# Patient Record
Sex: Male | Born: 1940 | Race: White | Hispanic: No | Smoking: Never smoker
Health system: Southern US, Community
[De-identification: ages and names within clinical notes are randomized; demographics above are authoritative.]

## PROBLEM LIST (undated history)

## (undated) DIAGNOSIS — E785 Hyperlipidemia, unspecified: Secondary | ICD-10-CM

## (undated) DIAGNOSIS — R609 Edema, unspecified: Secondary | ICD-10-CM

## (undated) DIAGNOSIS — R42 Dizziness and giddiness: Secondary | ICD-10-CM

## (undated) DIAGNOSIS — R0602 Shortness of breath: Secondary | ICD-10-CM

## (undated) DIAGNOSIS — I639 Cerebral infarction, unspecified: Secondary | ICD-10-CM

## (undated) DIAGNOSIS — R911 Solitary pulmonary nodule: Secondary | ICD-10-CM

## (undated) DIAGNOSIS — K219 Gastro-esophageal reflux disease without esophagitis: Secondary | ICD-10-CM

## (undated) DIAGNOSIS — I209 Angina pectoris, unspecified: Secondary | ICD-10-CM

## (undated) DIAGNOSIS — M1990 Unspecified osteoarthritis, unspecified site: Secondary | ICD-10-CM

## (undated) DIAGNOSIS — I10 Essential (primary) hypertension: Secondary | ICD-10-CM

## (undated) DIAGNOSIS — K5900 Constipation, unspecified: Secondary | ICD-10-CM

## (undated) DIAGNOSIS — Z87442 Personal history of urinary calculi: Secondary | ICD-10-CM

## (undated) DIAGNOSIS — C6290 Malignant neoplasm of unspecified testis, unspecified whether descended or undescended: Secondary | ICD-10-CM

## (undated) DIAGNOSIS — I2510 Atherosclerotic heart disease of native coronary artery without angina pectoris: Secondary | ICD-10-CM

## (undated) HISTORY — DX: Solitary pulmonary nodule: R91.1

## (undated) HISTORY — DX: Hyperlipidemia, unspecified: E78.5

## (undated) HISTORY — PX: TONSILLECTOMY: SUR1361

## (undated) HISTORY — DX: Dizziness and giddiness: R42

## (undated) HISTORY — DX: Malignant neoplasm of unspecified testis, unspecified whether descended or undescended: C62.90

## (undated) HISTORY — DX: Edema, unspecified: R60.9

## (undated) HISTORY — PX: OTHER SURGICAL HISTORY: SHX169

## (undated) HISTORY — PX: APPENDECTOMY: SHX54

## (undated) HISTORY — PX: ROTATOR CUFF REPAIR: SHX139

## (undated) HISTORY — DX: Atherosclerotic heart disease of native coronary artery without angina pectoris: I25.10

## (undated) HISTORY — DX: Unspecified osteoarthritis, unspecified site: M19.90

## (undated) HISTORY — DX: Essential (primary) hypertension: I10

## (undated) HISTORY — PX: TOTAL HIP ARTHROPLASTY: SHX124

---

## 2008-05-11 ENCOUNTER — Ambulatory Visit: Payer: Self-pay | Admitting: Cardiology

## 2008-05-11 ENCOUNTER — Ambulatory Visit: Payer: Self-pay | Admitting: Cardiovascular Disease

## 2008-05-11 ENCOUNTER — Inpatient Hospital Stay (HOSPITAL_COMMUNITY): Admission: EM | Admit: 2008-05-11 | Discharge: 2008-05-15 | Payer: Self-pay | Admitting: Emergency Medicine

## 2008-05-14 ENCOUNTER — Encounter (INDEPENDENT_AMBULATORY_CARE_PROVIDER_SITE_OTHER): Payer: Self-pay | Admitting: Emergency Medicine

## 2008-05-14 ENCOUNTER — Ambulatory Visit: Payer: Self-pay | Admitting: Surgery

## 2008-05-14 ENCOUNTER — Encounter: Payer: Self-pay | Admitting: Cardiovascular Disease

## 2008-05-27 ENCOUNTER — Ambulatory Visit: Payer: Self-pay | Admitting: Internal Medicine

## 2008-07-10 ENCOUNTER — Ambulatory Visit (HOSPITAL_COMMUNITY): Admission: RE | Admit: 2008-07-10 | Discharge: 2008-07-10 | Payer: Self-pay | Admitting: Internal Medicine

## 2008-07-15 ENCOUNTER — Ambulatory Visit: Payer: Self-pay | Admitting: Internal Medicine

## 2008-07-15 LAB — CONVERTED CEMR LAB: AST: 24 units/L (ref 0–37)

## 2008-07-22 ENCOUNTER — Ambulatory Visit: Payer: Self-pay | Admitting: Internal Medicine

## 2008-07-22 DIAGNOSIS — I10 Essential (primary) hypertension: Secondary | ICD-10-CM

## 2008-07-22 DIAGNOSIS — E782 Mixed hyperlipidemia: Secondary | ICD-10-CM

## 2008-07-22 DIAGNOSIS — E785 Hyperlipidemia, unspecified: Secondary | ICD-10-CM

## 2008-07-22 DIAGNOSIS — I2510 Atherosclerotic heart disease of native coronary artery without angina pectoris: Secondary | ICD-10-CM

## 2008-07-22 HISTORY — DX: Atherosclerotic heart disease of native coronary artery without angina pectoris: I25.10

## 2008-07-22 HISTORY — DX: Hyperlipidemia, unspecified: E78.5

## 2008-07-22 HISTORY — DX: Essential (primary) hypertension: I10

## 2008-07-29 ENCOUNTER — Telehealth: Payer: Self-pay | Admitting: Gastroenterology

## 2008-09-09 ENCOUNTER — Ambulatory Visit: Payer: Self-pay | Admitting: Internal Medicine

## 2008-09-09 LAB — CONVERTED CEMR LAB
BUN: 21 mg/dL (ref 6–23)
Potassium: 4.4 meq/L (ref 3.5–5.1)

## 2008-10-17 ENCOUNTER — Telehealth (INDEPENDENT_AMBULATORY_CARE_PROVIDER_SITE_OTHER): Payer: Self-pay | Admitting: *Deleted

## 2008-11-08 ENCOUNTER — Ambulatory Visit: Payer: Self-pay | Admitting: Internal Medicine

## 2008-11-08 DIAGNOSIS — R0602 Shortness of breath: Secondary | ICD-10-CM

## 2008-11-08 DIAGNOSIS — M542 Cervicalgia: Secondary | ICD-10-CM

## 2008-11-10 LAB — CONVERTED CEMR LAB
BUN: 27 mg/dL — ABNORMAL HIGH (ref 6–23)
Basophils Absolute: 0 10*3/uL (ref 0.0–0.1)
Eosinophils Relative: 3 % (ref 0.0–5.0)
GFR calc non Af Amer: 78.8 mL/min (ref >60)
Hemoglobin: 12.9 g/dL — ABNORMAL LOW (ref 13.0–17.0)
Monocytes Absolute: 0.7 10*3/uL (ref 0.1–1.0)
Platelets: 218 10*3/uL (ref 150.0–400.0)
Potassium: 4.3 meq/L (ref 3.5–5.1)
RBC: 4.06 M/uL — ABNORMAL LOW (ref 4.22–5.81)
Sodium: 142 meq/L (ref 135–145)
Total CK: 109 units/L (ref 7–232)

## 2008-11-11 ENCOUNTER — Telehealth (INDEPENDENT_AMBULATORY_CARE_PROVIDER_SITE_OTHER): Payer: Self-pay | Admitting: *Deleted

## 2008-11-12 ENCOUNTER — Encounter: Admission: RE | Admit: 2008-11-12 | Discharge: 2008-12-11 | Payer: Self-pay | Admitting: Internal Medicine

## 2008-11-14 ENCOUNTER — Encounter: Payer: Self-pay | Admitting: Internal Medicine

## 2008-11-20 ENCOUNTER — Telehealth (INDEPENDENT_AMBULATORY_CARE_PROVIDER_SITE_OTHER): Payer: Self-pay

## 2008-11-21 ENCOUNTER — Ambulatory Visit: Payer: Self-pay

## 2008-11-21 ENCOUNTER — Encounter: Payer: Self-pay | Admitting: Internal Medicine

## 2008-11-25 ENCOUNTER — Ambulatory Visit: Payer: Self-pay | Admitting: Internal Medicine

## 2008-11-25 ENCOUNTER — Encounter: Payer: Self-pay | Admitting: Internal Medicine

## 2008-11-25 ENCOUNTER — Telehealth: Payer: Self-pay | Admitting: Internal Medicine

## 2008-11-25 DIAGNOSIS — R079 Chest pain, unspecified: Secondary | ICD-10-CM

## 2008-11-26 ENCOUNTER — Ambulatory Visit (HOSPITAL_COMMUNITY): Admission: RE | Admit: 2008-11-26 | Discharge: 2008-11-26 | Payer: Self-pay | Admitting: Cardiovascular Disease

## 2008-11-26 ENCOUNTER — Ambulatory Visit: Payer: Self-pay | Admitting: Cardiology

## 2008-11-26 LAB — CONVERTED CEMR LAB
BUN: 21 mg/dL (ref 6–23)
Basophils Relative: 0.9 % (ref 0.0–3.0)
Calcium: 9.3 mg/dL (ref 8.4–10.5)
Chloride: 108 meq/L (ref 96–112)
Creatinine, Ser: 0.8 mg/dL (ref 0.4–1.5)
Eosinophils Absolute: 0.1 10*3/uL (ref 0.0–0.7)
Eosinophils Relative: 2.3 % (ref 0.0–5.0)
Hemoglobin: 12.5 g/dL — ABNORMAL LOW (ref 13.0–17.0)
Lymphocytes Relative: 22.2 % (ref 12.0–46.0)
Lymphs Abs: 1 10*3/uL (ref 0.7–4.0)
MCHC: 34.5 g/dL (ref 30.0–36.0)
MCV: 93.8 fL (ref 78.0–100.0)
Neutrophils Relative %: 62.3 % (ref 43.0–77.0)
Platelets: 217 10*3/uL (ref 150.0–400.0)
RDW: 13.4 % (ref 11.5–14.6)
WBC: 4.7 10*3/uL (ref 4.5–10.5)

## 2008-11-28 ENCOUNTER — Encounter: Payer: Self-pay | Admitting: Cardiovascular Disease

## 2008-12-02 ENCOUNTER — Telehealth (INDEPENDENT_AMBULATORY_CARE_PROVIDER_SITE_OTHER): Payer: Self-pay | Admitting: *Deleted

## 2008-12-04 ENCOUNTER — Inpatient Hospital Stay (HOSPITAL_COMMUNITY): Admission: AD | Admit: 2008-12-04 | Discharge: 2008-12-05 | Payer: Self-pay | Admitting: Cardiovascular Disease

## 2008-12-04 ENCOUNTER — Ambulatory Visit: Payer: Self-pay | Admitting: Cardiovascular Disease

## 2008-12-04 ENCOUNTER — Encounter: Payer: Self-pay | Admitting: Internal Medicine

## 2008-12-05 ENCOUNTER — Ambulatory Visit: Payer: Self-pay | Admitting: Vascular Surgery

## 2008-12-05 ENCOUNTER — Encounter: Payer: Self-pay | Admitting: Cardiovascular Disease

## 2008-12-05 ENCOUNTER — Telehealth (INDEPENDENT_AMBULATORY_CARE_PROVIDER_SITE_OTHER): Payer: Self-pay | Admitting: *Deleted

## 2008-12-05 DIAGNOSIS — R609 Edema, unspecified: Secondary | ICD-10-CM

## 2008-12-05 HISTORY — DX: Edema, unspecified: R60.9

## 2008-12-09 ENCOUNTER — Telehealth: Payer: Self-pay | Admitting: Internal Medicine

## 2008-12-09 ENCOUNTER — Encounter: Admission: RE | Admit: 2008-12-09 | Discharge: 2008-12-09 | Payer: Self-pay | Admitting: Internal Medicine

## 2008-12-25 ENCOUNTER — Encounter: Payer: Self-pay | Admitting: Physician Assistant

## 2008-12-25 ENCOUNTER — Ambulatory Visit: Payer: Self-pay | Admitting: Internal Medicine

## 2009-01-02 ENCOUNTER — Encounter (HOSPITAL_COMMUNITY): Admission: RE | Admit: 2009-01-02 | Discharge: 2009-04-02 | Payer: Self-pay | Admitting: Cardiovascular Disease

## 2009-01-08 ENCOUNTER — Encounter: Payer: Self-pay | Admitting: Internal Medicine

## 2009-01-24 ENCOUNTER — Encounter: Payer: Self-pay | Admitting: Internal Medicine

## 2009-02-04 ENCOUNTER — Encounter: Payer: Self-pay | Admitting: Cardiovascular Disease

## 2009-02-19 ENCOUNTER — Encounter (INDEPENDENT_AMBULATORY_CARE_PROVIDER_SITE_OTHER): Payer: Self-pay | Admitting: *Deleted

## 2009-02-26 ENCOUNTER — Encounter: Payer: Self-pay | Admitting: Internal Medicine

## 2009-02-27 ENCOUNTER — Ambulatory Visit: Payer: Self-pay | Admitting: Internal Medicine

## 2009-02-27 ENCOUNTER — Encounter: Payer: Self-pay | Admitting: Nurse Practitioner

## 2009-02-27 DIAGNOSIS — R109 Unspecified abdominal pain: Secondary | ICD-10-CM | POA: Insufficient documentation

## 2009-02-27 DIAGNOSIS — R42 Dizziness and giddiness: Secondary | ICD-10-CM

## 2009-02-27 DIAGNOSIS — R1084 Generalized abdominal pain: Secondary | ICD-10-CM

## 2009-02-27 HISTORY — DX: Dizziness and giddiness: R42

## 2009-02-27 LAB — CONVERTED CEMR LAB
Albumin: 3.4 g/dL — ABNORMAL LOW (ref 3.5–5.2)
Alkaline Phosphatase: 62 units/L (ref 39–117)
Bilirubin, Direct: 0 mg/dL (ref 0.0–0.3)
Total Bilirubin: 0.7 mg/dL (ref 0.3–1.2)

## 2009-02-28 ENCOUNTER — Encounter
Admission: RE | Admit: 2009-02-28 | Discharge: 2009-05-29 | Payer: Self-pay | Admitting: Physical Medicine & Rehabilitation

## 2009-03-03 ENCOUNTER — Ambulatory Visit (HOSPITAL_COMMUNITY): Admission: RE | Admit: 2009-03-03 | Discharge: 2009-03-03 | Payer: Self-pay | Admitting: Internal Medicine

## 2009-03-12 ENCOUNTER — Encounter: Payer: Self-pay | Admitting: Internal Medicine

## 2009-03-21 ENCOUNTER — Encounter: Payer: Self-pay | Admitting: Internal Medicine

## 2009-03-24 ENCOUNTER — Ambulatory Visit: Payer: Self-pay | Admitting: Internal Medicine

## 2009-03-27 ENCOUNTER — Ambulatory Visit: Payer: Self-pay | Admitting: Gastroenterology

## 2009-03-27 LAB — CONVERTED CEMR LAB
Amylase: 106 units/L (ref 27–131)
Lipase: 37 units/L (ref 11.0–59.0)

## 2009-03-28 ENCOUNTER — Telehealth (INDEPENDENT_AMBULATORY_CARE_PROVIDER_SITE_OTHER): Payer: Self-pay | Admitting: *Deleted

## 2009-03-28 LAB — CONVERTED CEMR LAB
AST: 25 units/L (ref 0–37)
Albumin: 4.1 g/dL (ref 3.5–5.2)
Basophils Absolute: 0 10*3/uL (ref 0.0–0.1)
Basophils Relative: 0 % (ref 0.0–3.0)
Calcium: 9.7 mg/dL (ref 8.4–10.5)
Lymphs Abs: 0.9 10*3/uL (ref 0.7–4.0)
MCV: 94 fL (ref 78.0–100.0)
Monocytes Relative: 10.2 % (ref 3.0–12.0)
Neutrophils Relative %: 67.5 % (ref 43.0–77.0)
Platelets: 192 10*3/uL (ref 150.0–400.0)
Potassium: 5 meq/L (ref 3.5–5.1)
RDW: 14.1 % (ref 11.5–14.6)
Sodium: 142 meq/L (ref 135–145)
TSH: 1.02 microintl units/mL (ref 0.35–5.50)
Total Protein: 6.9 g/dL (ref 6.0–8.3)

## 2009-04-03 ENCOUNTER — Encounter (HOSPITAL_COMMUNITY): Admission: RE | Admit: 2009-04-03 | Discharge: 2009-06-18 | Payer: Self-pay | Admitting: Cardiovascular Disease

## 2009-04-04 ENCOUNTER — Ambulatory Visit (HOSPITAL_COMMUNITY): Admission: RE | Admit: 2009-04-04 | Discharge: 2009-04-04 | Payer: Self-pay | Admitting: Internal Medicine

## 2009-04-04 ENCOUNTER — Ambulatory Visit: Payer: Self-pay | Admitting: Internal Medicine

## 2009-04-04 LAB — CONVERTED CEMR LAB
Cortisol, Plasma: 4.7 ug/dL
Sed Rate: 29 mm/hr — ABNORMAL HIGH (ref 0–22)

## 2009-04-08 ENCOUNTER — Encounter: Payer: Self-pay | Admitting: Gastroenterology

## 2009-04-08 ENCOUNTER — Ambulatory Visit: Payer: Self-pay | Admitting: Gastroenterology

## 2009-04-08 ENCOUNTER — Encounter: Payer: Self-pay | Admitting: Internal Medicine

## 2009-04-11 ENCOUNTER — Encounter: Payer: Self-pay | Admitting: Gastroenterology

## 2009-04-15 ENCOUNTER — Ambulatory Visit: Payer: Self-pay | Admitting: Physical Medicine & Rehabilitation

## 2009-05-05 ENCOUNTER — Encounter: Payer: Self-pay | Admitting: Cardiovascular Disease

## 2009-05-05 ENCOUNTER — Encounter (INDEPENDENT_AMBULATORY_CARE_PROVIDER_SITE_OTHER): Payer: Self-pay | Admitting: *Deleted

## 2009-05-13 ENCOUNTER — Encounter: Payer: Self-pay | Admitting: Internal Medicine

## 2009-07-28 ENCOUNTER — Ambulatory Visit: Payer: Self-pay | Admitting: Internal Medicine

## 2009-07-29 ENCOUNTER — Encounter: Payer: Self-pay | Admitting: Internal Medicine

## 2009-08-18 ENCOUNTER — Encounter: Payer: Self-pay | Admitting: Internal Medicine

## 2009-09-06 ENCOUNTER — Ambulatory Visit: Payer: Self-pay

## 2009-09-11 ENCOUNTER — Ambulatory Visit: Payer: Self-pay

## 2009-09-11 ENCOUNTER — Encounter: Payer: Self-pay | Admitting: Internal Medicine

## 2009-09-11 ENCOUNTER — Ambulatory Visit: Payer: Self-pay | Admitting: Internal Medicine

## 2009-09-11 ENCOUNTER — Ambulatory Visit (HOSPITAL_COMMUNITY): Admission: RE | Admit: 2009-09-11 | Discharge: 2009-09-11 | Payer: Self-pay | Admitting: Internal Medicine

## 2009-12-05 ENCOUNTER — Encounter (INDEPENDENT_AMBULATORY_CARE_PROVIDER_SITE_OTHER): Payer: Self-pay | Admitting: *Deleted

## 2010-07-12 ENCOUNTER — Encounter: Payer: Self-pay | Admitting: Internal Medicine

## 2010-07-13 ENCOUNTER — Encounter: Payer: Self-pay | Admitting: Internal Medicine

## 2010-07-21 NOTE — Letter (Signed)
Summary: [facility] Office Note  [facility] Office Note   Imported By: Roderic Ovens 10/22/2009 11:08:21  _____________________________________________________________________  External Attachment:    Type:   Image     Comment:   External Document

## 2010-07-21 NOTE — Letter (Signed)
Summary: Appointment - Reminder 2  [employer], Main Office  [address]   [place], [place] [address]   Phone: [phone]  Fax: [phone]     December 05, 2009 MRN: [id number]   [name] [address] [place], [place]  [address]   Dear [name],  Our records indicate that it is time to schedule a follow-up appointment with Dr. Tenny Craw in August. It is very important that we reach you to schedule this appointment. We look forward to participating in your health care needs. Please contact us at the number listed above at your earliest convenience to schedule your appointment.  If you are unable to make an appointment at this time, give [place] a call so we can update our records.     Sincerely,    Migdalia Dk [facility] Scheduling Team

## 2010-07-21 NOTE — Miscellaneous (Signed)
  Clinical Lists Changes  Orders: Added new Referral order of Echocardiogram (Echo) - Signed Added new Referral order of Event (Event) - Signed

## 2010-07-21 NOTE — Assessment & Plan Note (Signed)
Summary: 3 month rov/sl   Referring Provider:  Dietrich Pates, MD [facility]  [facility]   History of Present Illness: [name] is a 70 year old with a history of CAD (s/p PTCADES to prox LAD.  Residual 90% ostial diag (small); 30 to 40% LCx, 30%RCA.  LVEF 40% at tim. of cath.  I saw the patient about 3 months ago.   He denies chest pain, dizziness, shortness of breath. Deniied palpitations.   Biggest complaint is back pain.  Had MRI at [facility] in [place] (Dr. Jules Husbands).   Current Medications (verified): 1)  Tramadol Hcl 50 Mg Tabs (Tramadol Hcl) .... Take One Tablet Three  Times A Day 2)  Buffered Aspirin 325 Mg Tabs (Aspirin Buf(Cacarb-Mgcarb-Mgo)) .... Take One Tablet Once Daily 3)  Fish Oil  Oil (Fish Oil) .... 2 Times Per Day 4)  Sm Omeprazole 20 Mg Tbec (Omeprazole) .Marland Kitchen.. 1 Tablet 2 Times Per Day 5)  Nitroglycerin 0.4 Mg Subl (Nitroglycerin) .... One Tablet Under Tongue Every 5 Minutes As Needed For Chest Pain---May Repeat Times Three 6)  Daily-Vitamin  Tabs (Multiple Vitamin) .... Take One Daily 7)  Selenium 50 Mcg Tabs (Selenium) .... Take One Twice Daily 8)  Prostate  Tabs (Specialty Vitamins Products) .... Take One in The Evening 9)  Vitamin C 500 Mg  Tabs (Ascorbic Acid) .... Take 1 Tablet By Mouth Two Times A Day 10)  B Complex  Tabs (B Complex Vitamins) .... Take 1 Tablet By Mouth Two Times A Day 11)  Q-10 Co-Enzyme 30 Mg Caps (Coenzyme Q10) .... Once Daily 12)  Flax Seed Oil 1000 Mg Caps (Flaxseed (Linseed)) .... Two Times A Day  Allergies (verified): No Known Drug Allergies  Past History:  Past Medical History: Last updated: 03/27/2009 Coronary artery disease      a.  s/p des to LAD in 2009      b.  s/p repeat des to LAD 11/2008 (prox to previous stent). 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  Review of Systems       All systems reviewed.  negative to the above problem.  Vital Signs:  Patient profile:   70 year old male Height:      72 inches Weight:      220 pounds BMI:     29.95 Pulse rate:   48 / minute Pulse rhythm:   regular BP sitting:   160 / 78  (left arm) Cuff size:   regular  Vitals Entered By: Burnett Kanaris, CNA (July 28, 2009 4:43 PM)  Physical Exam  General:  Well developed, well nourished, in no acute distress. Head:  normocephalic and atraumatic Neck:  JVP is normal Lungs:  CTA.  No rales or wheezes. Heart:  RRR S1, S2.  No S3.  No significant murmurs. Extremities:  NO edema   EKG  Procedure date:  07/28/2009  Findings:      Sinus rhythm is wht ventricular bigeminy.  Impression & Recommendations:  Problem # 1:  CAD, NATIVE VESSEL (ICD-414.01) Patient is doing ok without chest pain or shortness of breath.  EKG is with ventircular bigeminy.  He denies palpitations. He should be on plavix with DES less than 1 year ago. [facility]. Will discuss with him readding b blocker.  Patient had to leave for church.  No symptoms of CHF on exam. The following medications were removed from the medication list:    Plavix 75 Mg Tabs (Clopidogrel bisulfate) .Marland Kitchen... Take one tablet by mouth daily    Metoprolol Tartrate 25 Mg Tabs (Metoprolol tartrate) .Marland Kitchen... Take one half  tablet  once daily His updated medication list for this problem includes:    Buffered Aspirin 325 Mg Tabs (Aspirin buf(cacarb-mgcarb-mgo)) .Marland Kitchen... Take one tablet once daily    Nitroglycerin 0.4 Mg Subl (Nitroglycerin) ..... One tablet under tongue every 5 minutes as needed for chest pain---may repeat times three    Plavix 75 Mg Tabs (Clopidogrel bisulfate) .Marland Kitchen... 1 tablet every day    Metoprolol Succinate 25 Mg Xr24h-tab (Metoprolol succinate) ..... One half a tablet every day  Problem # 2:  HYPERTENSION, BENIGN (ICD-401.1) BP is up today.  He just got in after long trip from [place].  Will discuss readding b blocker. The following medications were removed from the medication list:    Metoprolol Tartrate 25 Mg Tabs (Metoprolol tartrate) .Marland Kitchen... Take one half  tablet once daily His updated medication list for this problem includes:    Buffered Aspirin 325 Mg Tabs (Aspirin buf(cacarb-mgcarb-mgo)) .Marland Kitchen... Take one tablet once daily    Metoprolol Succinate 25 Mg Xr24h-tab (Metoprolol succinate) ..... One half a tablet every day  Problem # 3:  HYPERLIPIDEMIA-MIXED (ICD-272.4) Continue statin. The following medications were removed from the medication list:    Simvastatin 40 Mg Tabs (Simvastatin) .Marland Kitchen... Take one tablet by mouth daily at bedtime Prescriptions: METOPROLOL SUCCINATE 25 MG XR24H-TAB (METOPROLOL SUCCINATE) one half a tablet every day  #30 x 6   Entered by:   Layne Benton, RN, BSN   Authorized by:   Sherrill Raring, MD, [facility]   Signed by:   Layne Benton, RN, BSN on 07/29/2009   Method used:   Electronically to        [facility] [address] (retail)       [phone]       [place], [place]  [address]       Ph: [phone]       Fax: [phone]   RxID:   [id number]

## 2010-07-24 NOTE — Letter (Signed)
Summary: [employer] Plavix Request Form  [facility] Plavix Request Form   Imported By: Roderic Ovens 08/01/2009 15:50:21  _____________________________________________________________________  External Attachment:    Type:   Image     Comment:   External Document

## 2010-09-28 LAB — CBC
HCT: 36.1 % — ABNORMAL LOW (ref 39.0–52.0)
Hemoglobin: 12.3 g/dL — ABNORMAL LOW (ref 13.0–17.0)
MCHC: 34 g/dL (ref 30.0–36.0)
MCV: 94.3 fL (ref 78.0–100.0)
WBC: 4.2 10*3/uL (ref 4.0–10.5)
WBC: 5.7 10*3/uL (ref 4.0–10.5)

## 2010-09-28 LAB — BASIC METABOLIC PANEL
CO2: 28 mEq/L (ref 19–32)
CO2: 30 mEq/L (ref 19–32)
Calcium: 9.3 mg/dL (ref 8.4–10.5)
Chloride: 106 mEq/L (ref 96–112)
GFR calc Af Amer: >60 mL/min (ref >60)
GFR calc non Af Amer: >60 mL/min (ref >60)
Potassium: 4.7 mEq/L (ref 3.5–5.1)
Potassium: 4.8 mEq/L (ref 3.5–5.1)
Sodium: 140 mEq/L (ref 135–145)

## 2010-10-05 LAB — GLUCOSE, CAPILLARY: Glucose-Capillary: 109 mg/dL — ABNORMAL HIGH (ref 70–99)

## 2010-11-03 NOTE — H&P (Signed)
[name], [name]              ACCOUNT NO.:  [id number]   MEDICAL RECORD NO.:  [id number]          PATIENT TYPE:  INP   LOCATION:  3703                         FACILITY:  [facility]   PHYSICIAN:  Madolyn Frieze. Jens Som, MD, FACCDATE OF BIRTH:  [birth year]   DATE OF ADMISSION:  05/11/2008  DATE OF DISCHARGE:                              HISTORY & PHYSICAL   PRIMARY CARE PHYSICIAN:  Dr. Floyde Parkins, at the [place], [place].   PRIMARY CARDIOLOGIST:  None.   ADMITTING CARDIOLOGIST:  Madolyn Frieze. Jens Som, MD, [facility]   CHIEF COMPLAINT:  Diaphoresis/presyncope.   HISTORY OF PRESENT ILLNESS:  The patient is a 70 year old male with past  medical history of dyslipidemia who presented to the emergency room  complaining of significant diaphoresis, feeling like he was going to  pass out, dizziness, and nausea while at church earlier today.  He also  notes exertional chest pain over the last month associated with  shortness of breath.  He denies any radiation of his chest pain and  notes it is substernal in location and feels like an elephant is on his  chest.  His chest pain always resolves with rest after a few minutes.  He denies any history of cardiac catheterization but does note having an  echo done at the [place] several years ago.  He also notes that he was told  he had an MI when he was in his 93s because of some EKG findings but is  unable to elaborate on the details surrounding this.  He also has had  significant right-sided lower back pain over the last 3 days that he  describes as feeling very similar to when he had a kidney stone in the  past.  He denies any headaches, fevers, vomiting, and diarrhea.  He has  not had any recent travel history.  He does note dizziness with standing  over the last month.   PAST MEDICAL HISTORY:  1. Questionable MI in the past.  2. Dyslipidemia with high triglycerides.  3. History of nephrolithiasis.  4. Osteoarthritis.  5. GERD.  6. Testicular cancer 20  years ago.   PAST SURGICAL HISTORY:  1. Right hip replacement in June 2009.  2. Right rotator cuff repair.  3. Appendectomy.  4. Tonsillectomy.  5. Vein stripping many years ago.  6. 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   HOME MEDICATIONS:  Of note, the patient is unsure of the doses of the  following medications:  1. Gabapentin b.i.d.  2. Ranitidine nightly.  3. Gemfibrozil b.i.d.  4. Omeprazole b.i.d.  5. Tramadol t.i.d.  6. He is also on another medication that he cannot recall at this      time.   ALLERGIES:  The patient has no known drug allergies and is not allergic  to contrast dye.   SOCIAL HISTORY:  The patient lives in [place] alone and is widowed.  He is originally from [place] and moved to [place] about 6 years ago.  He currently works as an Geologist, engineering.  He has 2  children.  He denies any history of smoking or  illegal drugs and drinks  wine about once a week.   FAMILY MEDICAL HISTORY:  His mother had diabetes, hypertension, and an  MI but is unsure of her age at that time.  His father had COPD.  His  brother died from an abdominal aortic aneurysm.  He has another brother  that died from polycystic kidney disease.  He has 2 children that are  healthy.   REVIEW OF SYSTEMS:  Notable for blurry vision, diaphoresis, exertional  chest pain, shortness of breath, presyncope, cough, orthostatic  symptoms, lower back pain, and nausea.  All other systems reviewed and  are negative.   PHYSICAL EXAMINATION:  VITAL SIGNS:  Temperature 98.3, blood pressure  113/55, pulse 64, respiratory rate 16, and O2 sat 95% on room air.  GENERAL:  He is alert and oriented x3, pleasant, in no distress.  HEENT:  Normocephalic and atraumatic, PERRLA, EOMI, sclerae clear,  oropharynx without erythema or exudate.  NECK:  Supple without lymphadenopathy, thyromegaly, carotid bruits, or  JVD.  CARDIOVASCULAR:  Regular rate and rhythm with normal S1 and  S2.  Distant  heart sounds but no audible murmurs, rubs, or gallops.  Carotid, radial,  and femoral pulses were 2+.  Decreased dorsalis pedis pulse on the left,  however, posterior tibial pulses are normal throughout.  LUNGS:  Clear to auscultation bilaterally without wheezing, crackles, or  rhonchi.  Normal respiratory effort.  ABDOMEN:  Good bowel sounds, soft, nontender, and nondistended without  hepatosplenomegaly.  EXTREMITIES:  No clubbing, cyanosis, or edema.  Calves appear equal in  size bilaterally and no calf tenderness noted.  MUSCULOSKELETAL:  Strength 5/5 in all extremities.  The right lumbar  spine is mildly tender to palpation.  NEUROLOGIC:  Alert and oriented x3 with cranial nerves II through XII  grossly intact and strength 5/5 in all extremities and no cerebellar  testing abnormalities.   DIAGNOSTIC IMAGING:  An EKG shows a normal sinus rhythm at a rate of 62  with normal axis and normal interval.  Nonspecific ST-T wave changes  with some mild T-wave flattening anteriorly.  No Q's and no prior EKGs  with which to compare.   ADMISSION LABORATORY DATA:  White blood cell count 6.9, hemoglobin 13.0,  MCV 93, and platelets 223.  Sodium 138, potassium 4.3, chloride 106,  bicarb 22, BUN 22, creatinine 1.25, glucose 100, total bilirubin 0.4,  alkaline phosphatase 76, AST 23, ALT 18, total protein 7.3, and albumin  3.9.  D-dimer 1.25.  Point-of-care markers, myoglobin 186, CK-MB 2.7,  and troponin less than 0.05.   ASSESSMENT AND PLAN:  1. Presyncope/diaphoresis.  Differential for this include arrhythmia,      transient ischemic attacks, hypoglycemia, pulmonary process, or      acute chest syndrome.  At this point, we will monitor the patient      on telemetry to look for any arrhythmias, check carotid Dopplers,      and check orthostatic vital signs given significant orthostatic      symptoms.  We will also cycle cardiac enzymes and repeat EKG in the      morning.  His  history is suspicious for coronary disease so we will      plan for cardiac catheterization in case ischemic heart disease may      be contributing to his symptoms.  Given the patient's elevated D-      dimer of 1.25, we will also check a CT angio of the chest to  evaluate for pulmonary embolism.  2. Chest pain.  The patient's symptoms are highly suspicious for      angina.  Given his multiple risk factors to include age, gender,      dyslipidemia, and questionable family history along with      questionable personal history of myocardial infarction in the past,      we will proceed with cardiac catheterization on May 13, 2008.      We will start aspirin therapy, low-dose beta-blocker, and heparin      drip.  The patient is currently chest pain free at this point.  We      will check lipids in the morning and evaluate for statin therapy at      that time.  Given the patient also has an elevated D-dimer, we will      also check CT angio of the chest to rule out pulmonary embolus.  We      will also place the patient on a PPI.  3. Back pain.  The patient notes back pain is very similar to when he      had a kidney stone in the past.  We will check the urinalysis to      evaluate for red blood cells.  If positive, may need a noncontrast      CAT scan to evaluate for stones.  We will continue with Tylenol      p.r.n. for pain.      Joaquin Courts, MD  Electronically Signed      Madolyn Frieze. Jens Som, MD, [facility]  Electronically Signed    VW/MEDQ  D:  05/11/2008  T:  05/11/2008  Job:  [id number]

## 2010-11-03 NOTE — Cardiovascular Report (Signed)
[name]              ACCOUNT NO.:  [id number]   MEDICAL RECORD NO.:  [id number]          PATIENT TYPE:  INP   LOCATION:  2501                         FACILITY:  [facility]   PHYSICIAN:  Verne Carrow, MDDATE OF BIRTH:  [birth year]   DATE OF PROCEDURE:  12/04/2008  DATE OF DISCHARGE:                            CARDIAC CATHETERIZATION   PRIMARY CARDIOLOGIST:  Pricilla Riffle, MD, [facility]   PROCEDURES PERFORMED:  1. Left heart catheterization.  2. Intravascular ultrasound of the left anterior descending coronary      artery.  3. Percutaneous coronary intervention with placement of two XIENCE      drug-eluting stents in an overlapping fashion extending from the      mid stented segment back to the ostium of the left anterior      descending coronary artery.  4. Placement of an Angio-Seal femoral artery closure device.   OPERATOR:  Verne Carrow, MD   INDICATIONS:  Chest pain in the patient with known coronary artery  disease with prior stent placement in the mid LAD in November 2009.   PROCEDURE IN DETAIL:  The patient was brought into the Main Cardiac  Catheterization Laboratory after signing informed consent for the  procedure.  The right groin was prepped and draped in a sterile fashion.  Lidocaine 1% was used for local anesthesia.  A 6-French sheath was  inserted into the right femoral artery without difficulty.  Once the  sheath was in place properly, the patient was given a bolus of Angiomax  and drip was started.  An XB LAD 3.5 guiding catheter was used to  selectively engage the left main coronary artery.  Once the ACT was  greater than 200, a Cougar intracoronary wire was passed down the length  of left anterior descending coronary artery.   At this point of the case, we passed an intravascular ultrasound  catheter down into the mid LAD beyond the stented segment.  Pullback of  the intravascular ultrasound catheter demonstrated 60-70% stenosis on  the  proximal edge of the stented segment.  This was just prior to the  stented segment.  There was no in-stent restenosis.  The stent was  properly expanded.  As the catheter was pulled back throughout the  proximal left anterior descending, it revealed diffuse disease.  This  culminated in an ulcerated hazy-appearing 80% stenosis in the proximal  LAD.   The IVUS catheter was removed and a 2.5 x 20-mm balloon was placed into  the mid LAD.  This was inflated twice in an overlapping fashion.  There  was some segmental calcification; however, there was good balloon  expansion.  At this point, I deployed a 3.5 x 28-mm XIENCE drug-eluting  stent in the mid LAD overlapping the distal edge into the prior placed  stent.  A 4.0 x 20-mm noncompliant balloon was inflated inside the stent  graft.  This was removed and we replaced the intravascular ultrasound  catheter.  This demonstrated a small ulcerated plaque and possible  dissection on the front edge of our proximal stent.  We then deployed a  3.5 x 8-mm  XIENCE drug-eluting stent in an overlapping fashion with our  most proximal stent.  The 3.5 x 8-mm stent extended from the ostium of  the LAD down into the prior placed stent.  The same 4.0 x 20-mm  noncompliant balloon was used to postdilate all stented segments.  There  was an excellent angiographic result.   Repeat intravascular ultrasound at the end of the case showed good stent  expansion and no edge tears.  There was TIMI III flow down the LAD both  before and after the intervention.  The patient tolerated the procedure  well.  An Angio-Seal femoral artery closure device was placed into the  right femoral artery at the conclusion of the case.   IMPRESSION:  Successful percutaneous coronary intervention with  placement of two XIENCE drug-eluting stents in the proximal and mid left  anterior descending.   RECOMMENDATIONS:  The patient should continue on aspirin and Plavix for  at least 1  year.  All of his other medications will remain the same.      Verne Carrow, MD  Electronically Signed     CM/MEDQ  D:  12/04/2008  T:  12/05/2008  Job:  [id number]   cc:   Pricilla Riffle, MD, [facility]

## 2010-11-03 NOTE — Cardiovascular Report (Signed)
NAMELUCIAN, BASWELL              ACCOUNT NO.:  [id number]   MEDICAL RECORD NO.:  [id number]          PATIENT TYPE:  OIB   LOCATION:  2899                         FACILITY:  [facility]   PHYSICIAN:  Rollene Rotunda, MD, FACCDATE OF BIRTH:  Apr 15, 1941   DATE OF PROCEDURE:  11/26/2008  DATE OF DISCHARGE:  11/26/2008                            CARDIAC CATHETERIZATION   PROCEDURE:  Left heart catheterization/coronary angiography.   INDICATIONS:  Evaluate the patient with dizziness, dyspnea, and previous  coronary disease with stenting of the LAD.  He had a stress perfusion  study which demonstrated mild mid and apical anterior wall ischemia.   PROCEDURE NOTE:  Left heart catheterization was performed via the right  femoral artery.  The artery was cannulated using the anterior wall  puncture.  A #5-French arterial sheath was inserted via the modified  Seldinger technique.  Preformed Judkins and a pigtail catheter were  utilized.  The patient tolerated the procedure well and left the lab in  stable condition.   RESULTS:  Hemodynamics:  LV 149/22, AO 152/67.   Coronaries:  Left main was normal.  The LAD had long proximal 30%  stenosis.  There was a mid stent which was widely patent.  There was a  heavily calcified segment prior to this.  There was a significant step-  up from the native vessel to the proximal stent.  This suggested about a  50% narrowing leading into the stent.  This was more pronounced at the  end of the intervention as I reviewed the films.  First diagonal was  moderate size and normal.  The circumflex had ostial and proximal long  25% stenosis.  First obtuse marginal was small and normal.  Second  obtuse marginal was large with long proximal 25% stenosis.  Right  coronary artery was dominant and large with long proximal 25% stenosis.  The PDA was moderate but normal.   Left ventriculogram:  The left ventriculogram was obtained in the RAO  projection.  The EF was 50% with  no regional wall motion abnormalities.   CONCLUSION:  Nonobstructive left anterior descending stenosis and mild  plaque elsewhere.   PLAN:  At this point, I have reviewed the patient's symptoms.  They are  quite atypical but reminiscent of symptoms he had prior to his stent.  I  did review the films with Dr. Excell Seltzer and he agrees with medical  management pending further review of his stress perfusion study and  consideration of his case with Dr. Tenny Craw.  I have discussed the results  with the patient.      Rollene Rotunda, MD, [facility]  Electronically Signed     JH/MEDQ  D:  11/26/2008  T:  11/27/2008  Job:  [id number]

## 2010-11-03 NOTE — Discharge Summary (Signed)
[name], [name] NO.:  [id number]   MEDICAL RECORD NO.:  [id number]          PATIENT TYPE:  INP   LOCATION:  2501                         FACILITY:  [facility]   PHYSICIAN:  Pricilla Riffle, MD, FACCDATE OF BIRTH:  [birth year]   DATE OF ADMISSION:  12/04/2008  DATE OF DISCHARGE:  12/05/2008                               DISCHARGE SUMMARY   PRIMARY CARDIOLOGIST:  Pricilla Riffle, MD, [facility]   INTERVENTIONALIST:  Verne Carrow, MD   PRIMARY CARE PHYSICIAN:  Floyde Parkins in [place].   PROCEDURES PERFORMED DURING HOSPITALIZATION:  1. Left heart catheterization.  2. Intravascular ultrasound of the left anterior descending artery.  3. Percutaneous coronary intervention with placement of 2 XIENCE drug-      eluting stents in overlapping fashion extending from the mid      stented segment back to the ostium of the left anterior descending      coronary artery per Dr. Verne Carrow on December 04, 2008.   FINAL DISCHARGE DIAGNOSES:  1. Coronary artery disease.      a.     Stent placement to the left anterior descending on May 10, 2008, which was a drug-eluting stent.  2. Dyslipidemia.  3. History of nephrolithiasis.  4. Osteoarthritis.  5. Gastroesophageal reflux disease.  6. 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   HOSPITAL COURSE:  This is a 70 year old male patient of Dr. Dietrich Pates  who had a cardiac catheterization completed on November 26, 2008, by Dr.  Antoine Poche, revealing nonobstructive left anterior descending stenosis and  mild plaque elsewhere.  The patient had complaints of chest discomfort  and was found to have a need for PCI of the LAD secondary to abnormal  stress Myoview postcatheterization.  The patient was admitted as an  outpatient initially for repeat cardiac catheterization and intervention  and remained overnight for postprocedure evaluation.  The patient  tolerated the procedure well without any  evidence of bleeding, hematoma,  or signs of infection.  The patient was seen and examined by Dr. Dietrich Pates on day of discharge and found to be stable.  The patient will  continue on Plavix and aspirin and other medications prior to admission  without changes in doses.   DISCHARGE LABORATORY DATA:  Sodium 141, potassium 4.8, chloride 106, CO2  30, glucose 97, BUN 18, creatinine 0.94.  Hemoglobin 13.1, hematocrit  38.5, white blood cells 5.7, platelets 202.  PT 12.9, INR 1.0.   VITAL SIGNS ON DISCHARGE:  Blood pressure 124/60, heart rate 53,  respirations 18, O2 sat 98% on room air.   DISCHARGE MEDICATIONS:  1. Plavix 75 mg daily.  2. Omeprazole 20 mg twice a day.  3. Simvastatin 40 mg daily.  4. Metoprolol 25 mg twice a day.  5. Tramadol 50 mg 3 times a day.  6. Aspirin 325 daily.  7. Lisinopril 10 mg daily.  8. Nitroglycerin 0.4 mg under tongue as needed for chest pain.   ALLERGIES:  No known drug allergies.   FOLLOWUP PLANS AND APPOINTMENTS:  1. The patient has  been given postcardiac catheterization instructions      with particular emphasis on the right groin site for evidence of      bleeding, hematoma, signs of infection.  2. The patient has been advised there has been no change in his      medications.  3. The patient will follow up with Dr. Dietrich Pates or physician      assistant on December 25, 2008, at 10:45 a.m.  4. The patient will follow up with Dr. Jules Husbands for continued medical      management.  The patient is to call for followup appointment.  5. The patient has been advised to bring all medications to followup      appointment.   Time spent with the patient to include physician time 30 minutes.      Bettey Mare. Lyman Bishop, NP      Pricilla Riffle, MD, [facility]  Electronically Signed    KML/MEDQ  D:  12/05/2008  T:  12/06/2008  Job:  [id number]   cc:   Floyde Parkins

## 2010-11-03 NOTE — Discharge Summary (Signed)
[name], [name]              ACCOUNT NO.:  [id number]   MEDICAL RECORD NO.:  [id number]          PATIENT TYPE:  INP   LOCATION:  6524                         FACILITY:  [facility]   PHYSICIAN:  Verne Carrow, MDDATE OF BIRTH:  1941-05-07   DATE OF ADMISSION:  05/11/2008  DATE OF DISCHARGE:  05/15/2008                               DISCHARGE SUMMARY   PRIMARY CARDIOLOGIST:  Pricilla Riffle, MD, [facility]   DISCHARGE DIAGNOSIS:  Unstable angina.   SECONDARY DIAGNOSES:  1. Coronary artery disease status post successful percutaneous      coronary intervention and stenting of the mid left anterior      descending with placement of a 3.5 x 15 mm PROMUS drug-eluting      stent.  2. Hypertension.  3. Hyperlipidemia.  4. A 14-mm noncalcified peripheral right lower lobe pulmonary nodule,      which will require nonemergent followup PET/CT as an outpatient.  5. History of nephrolithiasis and osteoarthritis.  6. Gastroesophageal reflux disease.  7. History of testicular cancer approximately 20 years ago, status      post bilateral orchiectomy.  8. 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  9. Status post right rotator cuff repair.  10.Status post appendectomy.  11.Status post tonsillectomy.   ALLERGIES:  No known drug allergies.   PROCEDURES:  CT angio of the chest on May 11, 2008, revealing no  evidence of pulmonary embolism or aneurysm.  A 14-mm noncalcified  peripheral right lower lobe pulmonary nodule with recommended  nonemergent follow up PET/CT.  Cardiac catheterization on May 13, 2008, revealing 99% stenosis of a mid left anterior descending but  otherwise, nonobstructive disease.  The left anterior descending was  stented with a 3.5 x 15 mm PROMUS drug-eluting stent.  EF was 40% with  global hypokinesis.  A 2-D echocardiogram on May 14, 2008, showed  an EF of 60% with moderate focal stable septal hypertrophy.   HISTORY OF PRESENT ILLNESS:  A 70 year old  Caucasian male without prior  history of coronary artery disease.  He was in his usual state of health  until approximately 1 month prior to admission when he significantly  experienced exertional chest tightness and dyspnea.  On the day of  admission, he had sudden onset of diaphoresis, presyncope, and nausea  while at [facility].  He was taken to the [facility] ED where he was found  to be orthostatic.  He was aggressively hydrated and labs revealed  normal cardiac markers with an elevated of D-dimer of 1.25.  The patient  was admitted for further evaluation.   HOSPITAL COURSE:  In the setting of elevated D-dimer, a CT angio of the  chest was performed revealing no evidence of pulmonary embolism or  dissection.  As noted above, a 14-mm noncalcified peripheral right lower  lobe pulmonary nodule noticed, and Radiology recommended a PET/CT in  followup.  It was further reviewed as the patient continued to have  intermittent episodes of diaphoresis without chest pain and cardiac  markers remained negative.  On May 13, 2008, he underwent a left  heart cardiac catheterization  revealing 99% stenosis of mid LAD;  otherwise, normal coronary arteries.  The LAD was successfully stented  with a 3.5 x 15 mm PROMUS drug-eluting stent.  LV systolic function was  estimated to be 40% with global hypokinesis and as a result, a followup  2-D echo cardiogram was performed on May 14, 2008, revealing normal  LV function.  Impression of procedure:  The patient had a low-grade  fever.  His urinalysis was negative.  Chest x-ray and chest CT were  reviewed and showed no evidence of pneumonia or infectious process.  CBC  with differential shows no leukocytosis.  The patient has been  asymptomatic.   [name] has been seen by cardiac rehab and ambulated without  difficulty.  He has had no recurrent symptoms.  He will be discharged  home today in good condition.   DISCHARGE LABORATORY DATA:  Hemoglobin  11.8, hematocrit 35.1, WBC 5.9,  and platelets 203.  INR 1.0.  D-dimer 1.25.  Sodium 137, potassium 3.8,  chloride 103, CO2 is 25, BUN 12, creatinine 0.78, glucose 108, total  bilirubin 0.4, alkaline phosphatase 76, AST 23, ALT 18, total protein  7.3, albumin 3.9, and calcium 8.8.  Cardiac markers negative x4.  Total  cholesterol 189, triglycerides 62, HDL 55, LDL 122, and TSH 1.432.  Urinalysis negative.  Urine culture showed no growth.   DISPOSITION:  The patient will be discharged home today in good  condition.   FOLLOWUP APPOINTMENTS:  We will arrange a follow up with Dr. Dietrich Pates  on May 27, 2008 at 8:45 a.m.  As noted above, the patient will  require follow up nonemergent PET/CT of the chest to reevaluate  pulmonary nodule.  The patient was asked to follow up with Dr. Jules Husbands at  the [facility] as previously scheduled.   DISCHARGE MEDICATIONS:  1. Aspirin 325 mg daily.  2. Plavix 75 mg daily.  3. Metoprolol 25 mg b.i.d.  4. Gabapentin as previously prescribed (the patient does not know the      dose).  5. Ranitidine 150 mg nightly.  6. Tramadol 50 mg b.i.d.  7. Protonix 40 mg daily.  8. Lisinopril 10 mg daily.  9. Simvastatin 40 mg nightly.  10.Nitroglycerin 0.4 mg sublingual p.r.n. chest pain.   OUTSTANDING LABORATORY STUDIES:  Followup PET/CT was scheduled.   DURATION OF DISCHARGE/ENCOUNTER:  45 minutes including physician time.      Nicolasa Ducking, ANP      Verne Carrow, MD  Electronically Signed   CB/MEDQ  D:  05/15/2008  T:  05/15/2008  Job:  [id number]

## 2010-11-03 NOTE — Cardiovascular Report (Signed)
NAMETAREEK, [name]              ACCOUNT NO.:  [id number]   MEDICAL RECORD NO.:  [id number]          PATIENT TYPE:  INP   LOCATION:  6524                         FACILITY:  [facility]   PHYSICIAN:  Verne Carrow, MDDATE OF BIRTH:  1940/12/12   DATE OF PROCEDURE:  05/13/2008  DATE OF DISCHARGE:                            CARDIAC CATHETERIZATION   PROCEDURE PERFORMED:  1. Left heart catheterization.  2. Selective coronary angiography.  3. Left ventricular angiogram.  4. Percutaneous coronary intervention with placement of a PROMUS drug-      eluting stent in the mid left anterior descending.  5. Placement of an Angio-Seal femoral artery closure device in the      right femoral artery.   OPERATOR:  Verne Carrow, MD   INDICATION:  Chest pain.   PROCEDURE IN DETAIL:  The patient was brought to the Heart  Catheterization Laboratory after signing informed consent for the  procedure.  The right groin was prepped and draped in a sterile fashion.  Lidocaine 1% was used for local anesthesia.  A 5-French sheath was  inserted into the right femoral artery.  Standard diagnostic catheters  were used to perform selective angiography of the left coronary system  and the right coronary artery.  A 5-French pigtail catheter was used  across the aortic valve into the left ventricle.  Following the  performance of the left ventricular angiogram, the pigtail catheter was  pulled back across the aortic valve with no significant pressure  gradient measured.   At this point of the procedure, we elected to proceed to intervention of  a 99% mid LAD lesion.  The patient was given a bolus of Angiomax and was  started on an Angiomax drip.  He was given 600 mg of Plavix on the cath  table.  After achieving anticoagulation, an XB LAD 4.0 guiding catheter  was inserted into the left main coronary artery.  A Cougar intracoronary  wire was passed down the length of the LAD to the apex without  difficulty.  An Apex 2.5 x 12-mm balloon was used for predilatation of  the lesion.  A 3.5x 15- mm PROMUS drug-eluting stent was then inflated  to 12 atmospheres in the mid LAD.  A 3.75 x 12-mm noncompliant balloon  was used for postdilatation of the stent.  The pre-stenosis of the  lesion was 99% and following the intervention was 0%.  The patient  tolerated the procedure well and was taken to the holding area in stable  condition.   ANGIOGRAPHIC FINDINGS:  1. The left main coronary artery has no evidence of significant      disease.  2. The left anterior descending coronary artery has a long segment of      50% stenosis in the proximal portion.  There was a 99% stenosis in      the midportion of the LAD just after the take off of a large      diagonal branch.  The stenosis involves a smaller diagonal branch      that has an ostial 90% stenosis.  The larger first diagonal branch  has no evidence of disease.  There was plaque disease noted in the      distal LAD.  3. The circumflex artery has a 30-40% ostial stenosis as well as 40%      stenosis in the proximal portion.  This vessel gives off a small to      moderate-sized first obtuse marginal branch that has no disease.      There was a larger second obtuse marginal branch that has plaque      disease noted.  4. The right coronary artery is a large dominant vessel that has 30%      plaque noted in the proximal portion and luminal irregularities in      the midportion.  This bifurcates into a posterolateral branch and a      posterior descending segment.  5. The left ventricular angiogram demonstrates an ejection fraction of      40% with global hypokinesis and severe hypokinesis of the      anteroapical wall.   HEMODYNAMIC DATA:  Central aortic pressure 131/61, LV pressure 129/6 and  end-diastolic pressure 13.   IMPRESSION:  1. Single-vessel coronary artery disease.  2. Successful percutaneous coronary intervention of the  mid left      anterior descending with placement of a PROMUS drug-eluting stent.  3. Mildly reduced left ventricular systolic function.   RECOMMENDATIONS:  The patient should be continued on aspirin and Plavix  for at least 1 year.  He should be continued on his beta blocker.  We  will plan on starting a statin medication as well as an ACE inhibitor  prior to his discharge from the hospital.  The patient will require 2  hours of bedrest following the placement of the Angio-Seal femoral  artery closure device.      Verne Carrow, MD  Electronically Signed     CM/MEDQ  D:  05/13/2008  T:  05/14/2008  Job:  [id number]

## 2010-11-03 NOTE — Assessment & Plan Note (Signed)
[facility]                            CARDIOLOGY OFFICE NOTE   HOLTEN, SPANO                       MRN:          [id number]  DATE:07/22/2008                            DOB:          1941-04-11    IDENTIFICATION:  [name] is a 70 year old gentleman with CAD,  dyslipidemia.  I last saw him in clinic back in December.   The patient is status post PTCA/stent (Promus drug-eluting) to the  proximal LAD.  He has residual 90% ostial diagonal (small), 30-40%  circumflex, 30% RCA (dominant), LVEF was 40% with global hypokinesis,  severe in the anterior apical wall.   Since he had the intervention, the patient has been doing well.  He  denies any sensations like he had prior (elephant sitting on the chest,  dizziness, shortness of breath).  He is extremely active and is planning  to go on a mission trip.   Since seen he has also undergone PET CT, this did not show  hypermetabolic region in his lungs (right lower lobe).  Recommendation  for followup later in the spring to confirm stability.  The patient also  was noted to have a hypermetabolic region in the parathyroid (right).  It could be physiologic/vascular.  Again, recommend CT followup in about  three months' time.   [facility] and [facility] not [facility] his  Pro[facility].   CURRENT MEDICATIONS:  1. Multivitamin.  2. Lisinopril 2.5 daily.  3. Ranitidine 300 daily.  4. Tizanidine 2 tablets daily 4 mg total.  5. Simvastatin 40.  6. Metoprolol 25 b.i.d.  7. Tramadol 50 t.i.d.  8. Plavix 75 daily.  9. Aspirin 325.  10.Beta Prostate.  11.Vitamin C.  12.Vitamin B.  13.Selenium.  14.Fish oil.   PHYSICAL EXAMINATION:  GENERAL:  The patient is in no distress at rest.  VITAL SIGNS:  Blood pressure is 150/77, pulse 61 and regular, weight 215  up 2 pounds from previous.  NECK:  JVP is normal.  LUNGS:  Clear without rales.  CARDIAC:  Regular rate and rhythm.  S1, S2.   No S3, no murmurs.  ABDOMEN:  Benign, obese.  EXTREMITIES:  No edema.  Good pulses.   IMPRESSION:  1. Coronary artery disease, doing well post stenting.  We would      recommend a followup echo at some point, but I would not change the      clinical treatment for now.  Continue on Plavix, probably      indefinite given location of the stent.  2. Dyslipidemia.  We will need fasting lipid panel.  This was done.  I      will be in touch with him.  3. Hypertension, not optimal control, increased lisinopril to 10.  I      told the patient to keep an eye on his blood pressure.  I will see      him back later in the spring.  4. Abnormal CT/PET scan.  We will need a followup to evaluate the      right lower lung nodule as  well as the right parotid for stability,      does not appear to be a malignant lung lesion.  Again, the parotid      will need to be reevaluated.  5. GI will be [facility].   I will again set to see the patient back in the late spring, early  summer.  I encouraged him to stay active.  He is planning a mission trip  to [place].  I told him again to not forget his Plavix.     Pricilla Riffle, MD, [facility]  Electronically Signed    PVR/MedQ  DD: 07/22/2008  DT: 07/23/2008  Job #: [id number]   cc:   Ivory Broad, MD @ [facility]

## 2010-11-06 NOTE — Assessment & Plan Note (Signed)
[facility]                            CARDIOLOGY OFFICE NOTE   BARRY, FAIRCLOTH                       MRN:          [id number]  DATE:05/27/2008                            DOB:          1941/01/07    IDENTIFICATION:  [name] is a 70 year old gentleman who I saw during  hospitalization.  He comes in today for post-hospital followup.   HISTORY OF PRESENT ILLNESS:  On November 21, the patient was complaining  of dyspnea and dizziness.  I saw him while at church and drove him to  the [facility] Emergency Room and then he was admitted for unstable  angina.  The patient had complained of past several weeks prior to  admission of intermittent feeling of an elephant sitting on his chest.   The patient went on to have cardiac catheterization on the 23rd, this  showed left main had no significant disease.  LAD had 99% lesion in the  midportion after a large diagonal.  There was a smaller diagonal with an  ostial 90% lesion.  Circumflex had a 30-40% lesion at the ostium and 40%  proximal lesion.  The RCA was a large dominant vessel with a 30%  proximal lesion.  LVEF was 40% with global hypokinesis, severe  hypokinesis of the anterior apical wall.  The patient underwent  PTCA/stent placement (Promus drug-eluting stent) to the LAD and he was  discharged home.   Since discharge, the patient initially was tired, but he has gotten back  up on his feet and has been quite busy volunteering and working.  He  denies any more pressure sensations.  Breathing is okay.  No presyncopal  spells like he had had.  Current medicines include ranitidine 300 mg  daily, __________ tartrate 2 mg 2 tablets daily, gemfibrozil 600,  simvastatin 40, metoprolol 25 b.i.d., tramadol 50 t.i.d., terazosin 2,  three at bedtime, omeprazole 20 daily, lisinopril 10, Plavix 75,  aspirin?, beta prostate, vitamin E, vitamin C, B complex, Selenium and  fish oil, multivitamin, CholestOff.   PHYSICAL EXAMINATION:  GENERAL:  The patient is in no acute distress.  VITAL SIGNS:  Blood pressure is 122/55, pulse is 56 and regular, weight  is 213 pounds.  NECK:  No definite bruits.  JVP is normal.  LUNGS:  Clear without rales.  CARDIAC:  Regular rate and rhythm, S1, S2.  No S3, S4, or murmurs.  ABDOMEN:  Supple.  No hepatomegaly.  EXTREMITIES:  Right groin without hematoma.  No bruits.  2+ distal  pulses.   A 12-lead EKG, sinus bradycardia, 54 beats per minute.   IMPRESSION:  1. Coronary artery disease, recovering well, status post intervention      on his left anterior descending.  He just took his last Plavix and      I have given him samples and a prescription for more, and we will      need to get this approved through the [place].  Continue on the aspirin.      Continue on the metoprolol.  2. Dyslipidemia, currently on several medicines.  I told him to  continue on the simvastatin, stop the gemfibrozil, stop the      CholestOff, will need follow up in 8 weeks' time.  3. Hypertension, he is on terazosin and he complains of actually      incontinence, not problems with initiating urination.  I told him      to try stopping.  Again, he has only occasional dizziness, but I am      not sure this is helping.  He is on lisinopril.  Again, we will      need to follow blood pressure, also on beta-blocker therapy.   HEALTH CARE MAINTENANCE:  1. GE reflux.  I have told him to stop the ranitidine.  He is on      omeprazole.  He says with this alone, he gets reflux.  I have given      him prescription for Protonix if he needs.  2. Abnormal CT.  In the hospital, he had a noncalcified lesion in the      right lower lobe.  This will need a PET/CT scan to evaluate.  I      have discussed this with the patient, we will try to arrange.  3. I will set followup for 4-8 weeks or sooner if problems develop.     Pricilla Riffle, MD, [facility]  Electronically Signed    PVR/MedQ  DD: 06/03/2008   DT: 06/04/2008  Job #: [id number]

## 2010-11-28 ENCOUNTER — Emergency Department (HOSPITAL_COMMUNITY): Payer: Medicare Other

## 2010-11-28 ENCOUNTER — Emergency Department (HOSPITAL_COMMUNITY)
Admission: EM | Admit: 2010-11-28 | Discharge: 2010-11-28 | Disposition: A | Discharge status: Home / Self Care | Payer: Medicare Other | Attending: Emergency Medicine | Admitting: Emergency Medicine

## 2010-11-28 DIAGNOSIS — I951 Orthostatic hypotension: Secondary | ICD-10-CM | POA: Insufficient documentation

## 2010-11-28 DIAGNOSIS — R5381 Other malaise: Secondary | ICD-10-CM | POA: Insufficient documentation

## 2010-11-28 DIAGNOSIS — I2510 Atherosclerotic heart disease of native coronary artery without angina pectoris: Secondary | ICD-10-CM | POA: Insufficient documentation

## 2010-11-28 DIAGNOSIS — R5383 Other fatigue: Secondary | ICD-10-CM | POA: Insufficient documentation

## 2010-11-28 DIAGNOSIS — R05 Cough: Secondary | ICD-10-CM | POA: Insufficient documentation

## 2010-11-28 DIAGNOSIS — R079 Chest pain, unspecified: Secondary | ICD-10-CM | POA: Insufficient documentation

## 2010-11-28 DIAGNOSIS — R0609 Other forms of dyspnea: Secondary | ICD-10-CM | POA: Insufficient documentation

## 2010-11-28 DIAGNOSIS — Z79899 Other long term (current) drug therapy: Secondary | ICD-10-CM | POA: Insufficient documentation

## 2010-11-28 DIAGNOSIS — R059 Cough, unspecified: Secondary | ICD-10-CM | POA: Insufficient documentation

## 2010-11-28 DIAGNOSIS — R0989 Other specified symptoms and signs involving the circulatory and respiratory systems: Secondary | ICD-10-CM | POA: Insufficient documentation

## 2010-11-28 DIAGNOSIS — E785 Hyperlipidemia, unspecified: Secondary | ICD-10-CM | POA: Insufficient documentation

## 2010-11-28 LAB — CBC
HCT: 36.4 % — ABNORMAL LOW (ref 39.0–52.0)
Hemoglobin: 12.4 g/dL — ABNORMAL LOW (ref 13.0–17.0)
MCV: 90.8 fL (ref 78.0–100.0)
RDW: 13.7 % (ref 11.5–15.5)
WBC: 4 10*3/uL (ref 4.0–10.5)

## 2010-11-28 LAB — BASIC METABOLIC PANEL
BUN: 27 mg/dL — ABNORMAL HIGH (ref 6–23)
CO2: 25 mEq/L (ref 19–32)
Chloride: 104 mEq/L (ref 96–112)
Creatinine, Ser: 0.95 mg/dL (ref 0.4–1.5)
GFR calc Af Amer: >60 mL/min (ref >60)
Glucose, Bld: 116 mg/dL — ABNORMAL HIGH (ref 70–99)

## 2010-11-28 LAB — DIFFERENTIAL
Eosinophils Relative: 3 % (ref 0–5)
Lymphocytes Relative: 18 % (ref 12–46)
Lymphs Abs: 0.7 10*3/uL (ref 0.7–4.0)
Neutro Abs: 2.6 10*3/uL (ref 1.7–7.7)

## 2011-03-24 LAB — CBC
HCT: 35.1 — ABNORMAL LOW
MCHC: 33.1
MCHC: 33.2
MCHC: 34.2
MCV: 92.3
MCV: 93.1
MCV: 93.2
Platelets: 212
Platelets: 222
Platelets: 223
RBC: 3.68 — ABNORMAL LOW
RBC: 3.77 — ABNORMAL LOW
RBC: 4.2 — ABNORMAL LOW
RDW: 14
RDW: 14.7
WBC: 4
WBC: 5.9
WBC: 9

## 2011-03-24 LAB — DIFFERENTIAL
Eosinophils Absolute: 0.1
Eosinophils Relative: 1
Eosinophils Relative: 2
Lymphocytes Relative: 14
Lymphocytes Relative: 8 — ABNORMAL LOW
Lymphs Abs: 0.6 — ABNORMAL LOW
Lymphs Abs: 0.8
Monocytes Relative: 10
Monocytes Relative: 13 — ABNORMAL HIGH
Neutrophils Relative %: 71
Neutrophils Relative %: 81 — ABNORMAL HIGH

## 2011-03-24 LAB — CK TOTAL AND CKMB (NOT AT ARMC)
CK, MB: 2.3
Relative Index: INVALID
Total CK: 88

## 2011-03-24 LAB — URINE CULTURE
Colony Count: 9000
Special Requests: NEGATIVE

## 2011-03-24 LAB — BASIC METABOLIC PANEL
BUN: 14
CO2: 26
Calcium: 8.8
Chloride: 103
Chloride: 107
Creatinine, Ser: 0.77
Creatinine, Ser: 0.98
GFR calc Af Amer: >60
GFR calc Af Amer: >60
GFR calc Af Amer: >60
GFR calc non Af Amer: >60
GFR calc non Af Amer: >60
Potassium: 3.7
Potassium: 3.8
Sodium: 139

## 2011-03-24 LAB — COMPREHENSIVE METABOLIC PANEL
AST: 23
CO2: 22
Calcium: 9.5
Creatinine, Ser: 1.25
GFR calc Af Amer: >60
GFR calc non Af Amer: 57 — ABNORMAL LOW
Sodium: 138
Total Protein: 7.3

## 2011-03-24 LAB — POCT CARDIAC MARKERS
CKMB, poc: 2.7
Troponin i, poc: <0.05

## 2011-03-24 LAB — URINALYSIS, ROUTINE W REFLEX MICROSCOPIC
Bilirubin Urine: NEGATIVE
Ketones, ur: NEGATIVE
Nitrite: NEGATIVE
Urobilinogen, UA: 0.2

## 2011-03-24 LAB — TSH: TSH: 1.432

## 2011-03-24 LAB — D-DIMER, QUANTITATIVE: D-Dimer, Quant: 1.25 — ABNORMAL HIGH

## 2011-03-24 LAB — HEPARIN LEVEL (UNFRACTIONATED)
Heparin Unfractionated: 0.44
Heparin Unfractionated: 0.58

## 2011-03-24 LAB — LIPID PANEL
HDL: 55
Total CHOL/HDL Ratio: 3.4
VLDL: 12

## 2011-03-24 LAB — CARDIAC PANEL(CRET KIN+CKTOT+MB+TROPI)
CK, MB: 1.6
Troponin I: 0.01

## 2011-03-24 LAB — TROPONIN I: Troponin I: 0.01

## 2011-12-07 ENCOUNTER — Encounter: Payer: Self-pay | Admitting: *Deleted

## 2011-12-07 ENCOUNTER — Ambulatory Visit
Admission: RE | Admit: 2011-12-07 | Discharge: 2011-12-07 | Disposition: A | Discharge status: Home / Self Care | Payer: Medicare Other | Source: Ambulatory Visit | Attending: Nurse Practitioner | Admitting: Nurse Practitioner

## 2011-12-07 ENCOUNTER — Ambulatory Visit (INDEPENDENT_AMBULATORY_CARE_PROVIDER_SITE_OTHER): Payer: Medicare Other | Admitting: Nurse Practitioner

## 2011-12-07 VITALS — BP 120/64 | HR 81 | Ht 72.0 in | Wt 219.8 lb

## 2011-12-07 DIAGNOSIS — R0989 Other specified symptoms and signs involving the circulatory and respiratory systems: Secondary | ICD-10-CM

## 2011-12-07 DIAGNOSIS — I2510 Atherosclerotic heart disease of native coronary artery without angina pectoris: Secondary | ICD-10-CM

## 2011-12-07 DIAGNOSIS — R0602 Shortness of breath: Secondary | ICD-10-CM

## 2011-12-07 DIAGNOSIS — R5383 Other fatigue: Secondary | ICD-10-CM

## 2011-12-07 DIAGNOSIS — R42 Dizziness and giddiness: Secondary | ICD-10-CM

## 2011-12-07 DIAGNOSIS — R0600 Dyspnea, unspecified: Secondary | ICD-10-CM

## 2011-12-07 DIAGNOSIS — R079 Chest pain, unspecified: Secondary | ICD-10-CM

## 2011-12-07 DIAGNOSIS — R5381 Other malaise: Secondary | ICD-10-CM

## 2011-12-07 LAB — CBC WITH DIFFERENTIAL/PLATELET
Basophils Absolute: 0 10*3/uL (ref 0.0–0.1)
Basophils Relative: 0.6 % (ref 0.0–3.0)
Eosinophils Absolute: 0.1 10*3/uL (ref 0.0–0.7)
Eosinophils Relative: 2.8 % (ref 0.0–5.0)
HCT: 39.7 % (ref 39.0–52.0)
Hemoglobin: 13.2 g/dL (ref 13.0–17.0)
Lymphocytes Relative: 19.3 % (ref 12.0–46.0)
Lymphs Abs: 1 10*3/uL (ref 0.7–4.0)
MCHC: 33.3 g/dL (ref 30.0–36.0)
MCV: 93.8 fl (ref 78.0–100.0)
Monocytes Absolute: 0.6 10*3/uL (ref 0.1–1.0)
Monocytes Relative: 11.9 % (ref 3.0–12.0)
Neutro Abs: 3.2 10*3/uL (ref 1.4–7.7)
Neutrophils Relative %: 65.4 % (ref 43.0–77.0)
Platelets: 205 10*3/uL (ref 150.0–400.0)
RBC: 4.23 Mil/uL (ref 4.22–5.81)
RDW: 14.2 % (ref 11.5–14.6)
WBC: 4.9 10*3/uL (ref 4.5–10.5)

## 2011-12-07 LAB — TSH: TSH: 0.78 u[IU]/mL (ref 0.35–5.50)

## 2011-12-07 LAB — BASIC METABOLIC PANEL
BUN: 25 mg/dL — ABNORMAL HIGH (ref 6–23)
CO2: 30 mEq/L (ref 19–32)
Calcium: 9.3 mg/dL (ref 8.4–10.5)
Chloride: 108 mEq/L (ref 96–112)
Creatinine, Ser: 0.9 mg/dL (ref 0.4–1.5)
GFR: 90.51 mL/min (ref >60.00)
Glucose, Bld: 107 mg/dL — ABNORMAL HIGH (ref 70–99)
Potassium: 4.5 mEq/L (ref 3.5–5.1)
Sodium: 142 mEq/L (ref 135–145)

## 2011-12-07 LAB — BRAIN NATRIURETIC PEPTIDE: Pro B Natriuretic peptide (BNP): 61 pg/mL (ref 0.0–100.0)

## 2011-12-07 NOTE — Patient Instructions (Addendum)
We need to get your records from the [place]. We need to find out when you had your last CT scan and what has been done for you.  We are going to get an ultrasound of your heart  We are going to get a stress test Eugenie Birks)  We are going to check some labs today.    We need to check carotid doppler  We are going to get a CXR today at the [facility] at [facility]. You may walk in .  We will have you see Dr. Tenny Craw to discuss your results.  Call the [facility] office at [phone] if you have any questions, problems or concerns.

## 2011-12-07 NOTE — Progress Notes (Signed)
Lonzo Cloud Date of Birth: Mar 12, 1941 Medical Record #[id number]  History of Present Illness: Mr. Barnette is seen today for a work in visit. He is seen for Dr. Tenny Craw. He has known CAD with prior DES to the LAD and repeat DES to the LAD in 2010 proximal to the first stent. EF was apparently 40% with cath but echo from 2011 shows 50 to 55% with diastolic dysfunction, moderate LAE, mild RAE and mildly increased PA pressures. No recent studies noted. 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 Last EKG noted bigeminy PVC's and beta blocker therapy was started.   He comes in today. He has not been seen here since 2011.  He has multiple complaints.  He has had DOE for several months. It is only with exertion such as climbing steps or going up inclines. No cough. No swelling. He has been dizzy and feeling "nauseous" across his chest for the past month or so. His chest will feel heavy but if he sits up straight, he feels better. He is walking 3 to 4 times per week for 2 1/2 miles and does fine with no symptoms. He is for endoscopy later this summer due to prior esophageal stricture. In regards to what work up has been done at the [place] since his last visit here in 2011, he is not able to really give me any details. He is not sure if the abnormality on his PET scan of his parotid has had follow up. Not sure if he has had recent labs as well. No longer on his metoprolol and not sure why. Does have some dizziness and palpitations. No syncope. Does describe a spinning sensation. No PND, orthopnea, swelling reported. No actual pain anywhere. Not losing weight. No fever, chills, nightsweats reported.   Current Outpatient Prescriptions on File Prior to Visit  Medication Sig Dispense Refill  . ascorbic Acid (VITAMIN C) 500 MG CPCR Take  500 mg by mouth daily.      Marland Kitchen aspirin 81 MG tablet Take 81 mg by mouth daily.      . cholecalciferol (VITAMIN D) 1000 UNITS tablet Take 1,000 Units by mouth daily.      . clopidogrel (PLAVIX) 75 MG tablet Take 75 mg by mouth daily.      . Coenzyme Q10 (CO Q 10) 100 MG CAPS Take by mouth.      . Flaxseed, Linseed, (FLAX SEED OIL) 1000 MG CAPS Take by mouth.      . Multiple Vitamins-Minerals (CENTRUM SILVER PO) Take by mouth.      . Omega-3 Fatty Acids (FISH OIL) 1000 MG CAPS Take 2 capsules by mouth daily.      Marland Kitchen omeprazole (PRILOSEC) 20 MG capsule Take 20 mg by mouth 2 (two) times daily.      Marland Kitchen oxybutynin (DITROPAN) 5 MG tablet Take 5 mg by mouth 2 (two) times daily.      Marland Kitchen PAREGORIC PO Take by mouth.      . Plant Sterols and Stanols (CHOLEST OFF) 450 MG TABS Take 2 tablets by mouth daily.      . traMADol (ULTRAM) 50 MG tablet Take 50 mg by mouth every 6 (six) hours as needed.        No Known Allergies  Past Medical History  Diagnosis Date  . HYPERLIPIDEMIA-MIXED 07/22/2008  . HYPERTENSION, BENIGN 07/22/2008  .  CAD, NATIVE VESSEL 07/22/2008     a.  s/p DES to LAD in 2009; s/p repeat DES to LAD 11/2008 (prox to previous stent).  . Edema 12/05/2008  . DIZZINESS, CHRONIC 02/27/2009  . Testicular cancer   . DJD (degenerative joint disease)   . Lung nodule     RLL lung nodule (PET CT 1/10 negative);R parotid hypermetabolic on PET 06/2008    Past Surgical History  Procedure Date  . Appendectomy   . Tonsillectomy   . Rotator cuff repair     right  . Total hip arthroplasty     right  . Bilateral orchiectomy   . Veins stripped     History  Smoking status  . Never Smoker   Smokeless tobacco  . Never Used    History  Alcohol Use  . Yes    rare    Family History  Problem Relation Age of Onset  . Heart disease Mother   . Hypertension Mother   . Diabetes Mother   . COPD Father   . Aneurysm Brother   . Kidney disease Brother     Review of Systems: The review of systems is per  the HPI.  All other systems were reviewed and are negative.  Physical Exam: BP 120/64  Pulse 81  Ht 6' (1.829 m)  Wt 219 lb 12.8 oz (99.701 kg)  BMI 29.81 kg/m2 Patient is very pleasant and in no acute distress. Skin is warm and dry. Color looks sallow to me.  HEENT is unremarkable.  Normocephalic/atraumatic. PERRL. Sclera are nonicteric. Neck is supple. No masses. I do not feel a parotid mass on the right. Right carotid bruit noted. No JVD. Lungs are clear. Cardiac exam shows a regular rate and rhythm. Abdomen is soft. Extremities are without edema. Gait and ROM are intact. No gross neurologic deficits noted.   LABORATORY DATA: EKG today shows sinus rhythm and is normal.   Assessment / Plan:

## 2011-12-07 NOTE — Assessment & Plan Note (Signed)
He presents with a multitude of complaints. We are checking baseline labs today. Will arrange for a CXR. Will update his echo and arrange for stress testing. Will check carotid doppler to further evaluate the bruit heard on today's exam. We need to get some records from the [place] to see where he stands from the prior PET scan and the issue with his parotid. He has had testicular cancer in the past. After his studies are complete, will have him see Dr. Tenny Craw for discussion. For now, no change in his current medicines. Patient is agreeable to this plan and will call if any problems develop in the interim.

## 2011-12-09 ENCOUNTER — Ambulatory Visit (HOSPITAL_COMMUNITY): Payer: Medicare Other | Attending: Cardiology | Admitting: Radiology

## 2011-12-09 VITALS — BP 140/83 | HR 54 | Ht 72.0 in | Wt 215.0 lb

## 2011-12-09 DIAGNOSIS — R42 Dizziness and giddiness: Secondary | ICD-10-CM | POA: Insufficient documentation

## 2011-12-09 DIAGNOSIS — R5381 Other malaise: Secondary | ICD-10-CM | POA: Insufficient documentation

## 2011-12-09 DIAGNOSIS — Z8673 Personal history of transient ischemic attack (TIA), and cerebral infarction without residual deficits: Secondary | ICD-10-CM | POA: Insufficient documentation

## 2011-12-09 DIAGNOSIS — R0602 Shortness of breath: Secondary | ICD-10-CM

## 2011-12-09 DIAGNOSIS — I779 Disorder of arteries and arterioles, unspecified: Secondary | ICD-10-CM | POA: Insufficient documentation

## 2011-12-09 DIAGNOSIS — R0789 Other chest pain: Secondary | ICD-10-CM | POA: Insufficient documentation

## 2011-12-09 DIAGNOSIS — R5383 Other fatigue: Secondary | ICD-10-CM

## 2011-12-09 DIAGNOSIS — I2510 Atherosclerotic heart disease of native coronary artery without angina pectoris: Secondary | ICD-10-CM

## 2011-12-09 DIAGNOSIS — R0600 Dyspnea, unspecified: Secondary | ICD-10-CM

## 2011-12-09 DIAGNOSIS — E785 Hyperlipidemia, unspecified: Secondary | ICD-10-CM | POA: Insufficient documentation

## 2011-12-09 DIAGNOSIS — R0609 Other forms of dyspnea: Secondary | ICD-10-CM | POA: Insufficient documentation

## 2011-12-09 DIAGNOSIS — R002 Palpitations: Secondary | ICD-10-CM | POA: Insufficient documentation

## 2011-12-09 DIAGNOSIS — I2589 Other forms of chronic ischemic heart disease: Secondary | ICD-10-CM | POA: Insufficient documentation

## 2011-12-09 DIAGNOSIS — R079 Chest pain, unspecified: Secondary | ICD-10-CM

## 2011-12-09 DIAGNOSIS — I10 Essential (primary) hypertension: Secondary | ICD-10-CM | POA: Insufficient documentation

## 2011-12-09 DIAGNOSIS — R0989 Other specified symptoms and signs involving the circulatory and respiratory systems: Secondary | ICD-10-CM | POA: Insufficient documentation

## 2011-12-09 MED ORDER — TECHNETIUM TC 99M TETROFOSMIN IV KIT
11.0000 | PACK | Freq: Once | INTRAVENOUS | Status: AC | PRN
  Administered 2011-12-09: 11 via INTRAVENOUS

## 2011-12-09 MED ORDER — TECHNETIUM TC 99M TETROFOSMIN IV KIT
33.0000 | PACK | Freq: Once | INTRAVENOUS | Status: AC | PRN
  Administered 2011-12-09: 33 via INTRAVENOUS

## 2011-12-09 MED ORDER — REGADENOSON 0.4 MG/5ML IV SOLN
0.4000 mg | Freq: Once | INTRAVENOUS | Status: AC
  Administered 2011-12-09: 0.4 mg via INTRAVENOUS

## 2011-12-09 NOTE — Progress Notes (Signed)
[facility] SITE 3 NUCLEAR MED [address] [place] [address] [phone]  Cardiology Nuclear Med Study  [name] is a 71 y.o. male     MRN : [id number]     DOB: October 16, 1940  Procedure Date: 12/09/2011  Nuclear Med Background Indication for Stress Test:  Evaluation for Ischemia and Stent Patency History:  '10 ION:GEXB mid, apical and anterior ischemia>Stent-LAD, EF=50%; 3/11 Echo:EF=50-55% Cardiac Risk Factors: Carotid Disease, Hypertension, Lipids and TIA  Symptoms:  Chest Pressure with and without Exertion (last episode of chest discomfort was about 2-3 days ago), Dizziness, DOE/SOB, Fatigue and Palpitations    Nuclear Pre-Procedure Caffeine/Decaff Intake:  None NPO After: 9:00pm   Lungs:  Clear. O2 Sat: 96% on room air. IV 0.9% NS with Angio Cath:  22g  IV Site: R Antecubital  IV Started by:  Milana Na, EMT-P  Chest Size (in):  44 Cup Size: n/a  Height: 6' (1.829 m)  Weight:  215 lb (97.523 kg)  BMI:  Body mass index is 29.16 kg/(m^2). Tech Comments:  RX with water this am    Nuclear Med Study 1 or 2 day study: 1 day  Stress Test Type:  Treadmill/Lexiscan  Reading MD: Marca Ancona, MD  Order Authorizing Provider:  Dietrich Pates, MD  Resting Radionuclide: Technetium 34m Tetrofosmin  Resting Radionuclide Dose: 11.0 mCi   Stress Radionuclide:  Technetium 79m Tetrofosmin  Stress Radionuclide Dose: 33.0 mCi           Stress Protocol Rest HR: 54 Stress HR: 103  Rest BP: 140/83 Stress BP: 152/90  Exercise Time (min): 2:00 METS: n/a   Predicted Max HR: 149 bpm % Max HR: 69.13 bpm Rate Pressure Product: 28413   Dose of Adenosine (mg):  n/a Dose of Lexiscan: 0.4 mg  Dose of Atropine (mg): n/a Dose of Dobutamine: n/a mcg/kg/min (at max HR)  Stress Test Technologist: Smiley Houseman, CMA-N  Nuclear Technologist:  Doyne Keel, CNMT     Rest Procedure:  Myocardial perfusion imaging was performed at rest 45 minutes following the intravenous  administration of Technetium 28m Tetrofosmin.  Rest ECG: No acute changes  Stress Procedure:  The patient received IV Lexiscan 0.4 mg over 15-seconds with concurrent low level exercise and then Technetium 81m Tetrofosmin was injected at 30-seconds while the patient continued walking one more minute. There were no significant changes with Lexiscan bolus, occasional PVC's were noted. Quantitative spect images were obtained after a 45-minute delay.  Stress ECG: No significant change from baseline ECG  QPS Raw Data Images:  Normal; no motion artifact; normal heart/lung ratio. Stress Images:  Normal homogeneous uptake in all areas of the myocardium. Rest Images:  Normal homogeneous uptake in all areas of the myocardium. Subtraction (SDS):  There is no evidence of scar or ischemia. Transient Ischemic Dilatation (Normal <1.22):  1.09 Lung/Heart Ratio (Normal <0.45):  0.32  Quantitative Gated Spect Images QGS EDV:  114 ml QGS ESV:  43 ml  Impression Exercise Capacity:  Lexiscan with low level exercise. BP Response:  Normal blood pressure response. Clinical Symptoms:  Short of breath  ECG Impression:  No significant ST segment change suggestive of ischemia. Comparison with Prior Nuclear Study: No images to compare  Overall Impression:  Normal stress nuclear study.  LV Ejection Fraction: 62%.  LV Wall Motion:  NL LV Function; NL Wall Motion  [employer]

## 2011-12-10 ENCOUNTER — Ambulatory Visit (HOSPITAL_COMMUNITY): Payer: Medicare Other | Attending: Cardiovascular Disease | Admitting: Radiology

## 2011-12-10 DIAGNOSIS — I10 Essential (primary) hypertension: Secondary | ICD-10-CM | POA: Insufficient documentation

## 2011-12-10 DIAGNOSIS — R42 Dizziness and giddiness: Secondary | ICD-10-CM

## 2011-12-10 DIAGNOSIS — E785 Hyperlipidemia, unspecified: Secondary | ICD-10-CM | POA: Insufficient documentation

## 2011-12-10 DIAGNOSIS — R079 Chest pain, unspecified: Secondary | ICD-10-CM | POA: Insufficient documentation

## 2011-12-10 DIAGNOSIS — I2510 Atherosclerotic heart disease of native coronary artery without angina pectoris: Secondary | ICD-10-CM | POA: Insufficient documentation

## 2011-12-10 DIAGNOSIS — R609 Edema, unspecified: Secondary | ICD-10-CM | POA: Insufficient documentation

## 2011-12-10 DIAGNOSIS — R5383 Other fatigue: Secondary | ICD-10-CM

## 2011-12-10 DIAGNOSIS — R0609 Other forms of dyspnea: Secondary | ICD-10-CM | POA: Insufficient documentation

## 2011-12-10 DIAGNOSIS — M1990 Unspecified osteoarthritis, unspecified site: Secondary | ICD-10-CM | POA: Insufficient documentation

## 2011-12-10 DIAGNOSIS — R0600 Dyspnea, unspecified: Secondary | ICD-10-CM

## 2011-12-10 DIAGNOSIS — R0989 Other specified symptoms and signs involving the circulatory and respiratory systems: Secondary | ICD-10-CM

## 2011-12-10 NOTE — Progress Notes (Signed)
Echocardiogram performed.  

## 2011-12-20 ENCOUNTER — Encounter (INDEPENDENT_AMBULATORY_CARE_PROVIDER_SITE_OTHER): Payer: Medicare Other

## 2011-12-20 DIAGNOSIS — I2510 Atherosclerotic heart disease of native coronary artery without angina pectoris: Secondary | ICD-10-CM

## 2011-12-20 DIAGNOSIS — R0989 Other specified symptoms and signs involving the circulatory and respiratory systems: Secondary | ICD-10-CM

## 2011-12-20 DIAGNOSIS — R0600 Dyspnea, unspecified: Secondary | ICD-10-CM

## 2011-12-20 DIAGNOSIS — I6529 Occlusion and stenosis of unspecified carotid artery: Secondary | ICD-10-CM

## 2011-12-20 DIAGNOSIS — R079 Chest pain, unspecified: Secondary | ICD-10-CM

## 2011-12-20 DIAGNOSIS — R42 Dizziness and giddiness: Secondary | ICD-10-CM

## 2011-12-20 DIAGNOSIS — R5383 Other fatigue: Secondary | ICD-10-CM

## 2012-02-03 ENCOUNTER — Ambulatory Visit: Payer: Medicare Other | Admitting: Internal Medicine

## 2012-03-21 ENCOUNTER — Encounter: Payer: Self-pay | Admitting: Internal Medicine

## 2012-07-09 ENCOUNTER — Encounter (HOSPITAL_COMMUNITY): Payer: Self-pay | Admitting: *Deleted

## 2012-07-09 ENCOUNTER — Emergency Department (HOSPITAL_COMMUNITY): Payer: Medicare Other

## 2012-07-09 ENCOUNTER — Emergency Department (HOSPITAL_COMMUNITY)
Admission: EM | Admit: 2012-07-09 | Discharge: 2012-07-09 | Disposition: A | Discharge status: Home / Self Care | Payer: Medicare Other | Attending: Emergency Medicine | Admitting: Emergency Medicine

## 2012-07-09 DIAGNOSIS — I2510 Atherosclerotic heart disease of native coronary artery without angina pectoris: Secondary | ICD-10-CM | POA: Insufficient documentation

## 2012-07-09 DIAGNOSIS — R059 Cough, unspecified: Secondary | ICD-10-CM | POA: Insufficient documentation

## 2012-07-09 DIAGNOSIS — E782 Mixed hyperlipidemia: Secondary | ICD-10-CM | POA: Insufficient documentation

## 2012-07-09 DIAGNOSIS — Z8679 Personal history of other diseases of the circulatory system: Secondary | ICD-10-CM | POA: Insufficient documentation

## 2012-07-09 DIAGNOSIS — J069 Acute upper respiratory infection, unspecified: Secondary | ICD-10-CM | POA: Insufficient documentation

## 2012-07-09 DIAGNOSIS — I10 Essential (primary) hypertension: Secondary | ICD-10-CM | POA: Insufficient documentation

## 2012-07-09 DIAGNOSIS — Z8547 Personal history of malignant neoplasm of testis: Secondary | ICD-10-CM | POA: Insufficient documentation

## 2012-07-09 DIAGNOSIS — Z8709 Personal history of other diseases of the respiratory system: Secondary | ICD-10-CM | POA: Insufficient documentation

## 2012-07-09 DIAGNOSIS — R42 Dizziness and giddiness: Secondary | ICD-10-CM | POA: Insufficient documentation

## 2012-07-09 DIAGNOSIS — R05 Cough: Secondary | ICD-10-CM | POA: Insufficient documentation

## 2012-07-09 DIAGNOSIS — Z7982 Long term (current) use of aspirin: Secondary | ICD-10-CM | POA: Insufficient documentation

## 2012-07-09 DIAGNOSIS — Z7902 Long term (current) use of antithrombotics/antiplatelets: Secondary | ICD-10-CM | POA: Insufficient documentation

## 2012-07-09 DIAGNOSIS — M1990 Unspecified osteoarthritis, unspecified site: Secondary | ICD-10-CM | POA: Insufficient documentation

## 2012-07-09 DIAGNOSIS — R51 Headache: Secondary | ICD-10-CM | POA: Insufficient documentation

## 2012-07-09 DIAGNOSIS — Z79899 Other long term (current) drug therapy: Secondary | ICD-10-CM | POA: Insufficient documentation

## 2012-07-09 LAB — BASIC METABOLIC PANEL
BUN: 26 mg/dL — ABNORMAL HIGH (ref 6–23)
CO2: 24 mEq/L (ref 19–32)
Calcium: 9.3 mg/dL (ref 8.4–10.5)
Creatinine, Ser: 0.82 mg/dL (ref 0.50–1.35)
Glucose, Bld: 120 mg/dL — ABNORMAL HIGH (ref 70–99)

## 2012-07-09 LAB — CBC
MCH: 31.3 pg (ref 26.0–34.0)
MCHC: 33.7 g/dL (ref 30.0–36.0)
MCV: 93 fL (ref 78.0–100.0)
Platelets: 196 10*3/uL (ref 150–400)
RBC: 4.02 MIL/uL — ABNORMAL LOW (ref 4.22–5.81)
RDW: 13.7 % (ref 11.5–15.5)

## 2012-07-09 MED ORDER — DEXTROMETHORPHAN POLISTIREX 30 MG/5ML PO LQCR: 60.0000 mg | ORAL | Status: DC | PRN

## 2012-07-09 NOTE — ED Notes (Signed)
Pt c/o shortness of breath starting last week, getting progressively worse over the course of time. Pt states when he woke this morning, he felt dizzy and faint. Pt states cough x 3 days. Cough is dry and non-productive. Pt has hx of recurrent bronchitis. Pt has slight temp in triage. Pt denies n/v and has received a flu shot this year through the [place]. Pt c/o extreme fatigue. Pt c/o shortness of breath with mild exertion.

## 2012-07-09 NOTE — ED Provider Notes (Signed)
Medical screening examination/treatment/procedure(s) were conducted as a shared visit with non-physician practitioner(s) and myself.  I personally evaluated the patient during the encounter.  L:CTA, Pt nontoxic and OK with no antibiotics.  Hurman Horn, MD 07/16/12 [phone]

## 2012-07-09 NOTE — ED Provider Notes (Signed)
History     CSN: [id number]  Arrival date & time 07/09/12  4742   First MD Initiated Contact with Patient 07/09/12 2033      Chief Complaint  Patient presents with  . Shortness of Breath  . Cough  . Dizziness    (Consider location/radiation/quality/duration/timing/severity/associated sxs/prior treatment) HPI Comments: Since Thursday has had URI symptoms for which he has been taking OTC Muconex DM and another cough medication that he can not remember the name with little relief.  Last night when he got out of bed he was dizzy and walked into a wall without falling or hitting his head.  He has chronic dizziness.  He also is complaining of intermittent dry mucous membranes which he feels is making his cough worse.   Denies fever, chest pain ,N/V/D.    Patient is a 72 y.o. male presenting with shortness of breath and cough. The history is provided by the patient.  Shortness of Breath  The current episode started 3 to 5 days ago. The problem occurs occasionally. The problem has been gradually improving. The problem is mild. Nothing relieves the symptoms. Nothing aggravates the symptoms. Associated symptoms include rhinorrhea and cough. Pertinent negatives include no chest pain, no chest pressure, no orthopnea, no fever, no sore throat, no stridor, no shortness of breath and no wheezing. The cough has no precipitants. The cough is non-productive and dry. Nothing relieves the cough. Nothing worsens the cough. The rhinorrhea has been occurring intermittently. The nasal discharge has a clear appearance. He was not exposed to toxic fumes. He has not inhaled smoke recently.  Cough Associated symptoms include rhinorrhea. Pertinent negatives include no chest pain, no headaches, no sore throat, no myalgias, no shortness of breath and no wheezing.    Past Medical History  Diagnosis Date  . HYPERLIPIDEMIA-MIXED 07/22/2008  . HYPERTENSION, BENIGN 07/22/2008  . CAD, NATIVE VESSEL 07/22/2008     a.  s/p DES to  LAD in 2009; s/p repeat DES to LAD 11/2008 (prox to previous stent).  . Edema 12/05/2008  . DIZZINESS, CHRONIC 02/27/2009  . Testicular cancer   . DJD (degenerative joint disease)   . Lung nodule     RLL lung nodule (PET CT 1/10 negative);R parotid hypermetabolic on PET 06/2008    Past Surgical History  Procedure Date  . Appendectomy   . Tonsillectomy   . Rotator cuff repair     right  . Total hip arthroplasty     right  . Bilateral orchiectomy   . Veins stripped     Family History  Problem Relation Age of Onset  . Heart disease Mother   . Hypertension Mother   . Diabetes Mother   . COPD Father   . Aneurysm Brother   . Kidney disease Brother     History  Substance Use Topics  . Smoking status: Never Smoker   . Smokeless tobacco: Never Used  . Alcohol Use: Yes     Comment: rare      Review of Systems  Constitutional: Negative for fever.  HENT: Positive for rhinorrhea. Negative for sore throat and trouble swallowing.   Respiratory: Positive for cough. Negative for shortness of breath, wheezing and stridor.   Cardiovascular: Negative for chest pain and orthopnea.  Gastrointestinal: Negative.   Genitourinary: Negative.   Musculoskeletal: Negative for myalgias.  Skin: Negative.   Neurological: Positive for dizziness. Negative for headaches.    Allergies  Review of patient's allergies indicates no known allergies.  Home Medications  Current Outpatient Rx  Name  Route  Sig  Dispense  Refill  . ASPIRIN EC 81 MG PO TBEC   Oral   Take 81 mg by mouth daily.         Marland Kitchen VITAMIN D 1000 UNITS PO TABS   Oral   Take 1,000 Units by mouth daily.         Marland Kitchen CLOPIDOGREL BISULFATE 75 MG PO TABS   Oral   Take 75 mg by mouth daily.         . CO Q 10 100 MG PO CAPS   Oral   Take 1 capsule by mouth daily.          Marland Kitchen FLAX SEED OIL 1000 MG PO CAPS   Oral   Take 1 capsule by mouth daily.          . ADULT MULTIVITAMIN W/MINERALS CH   Oral   Take 1 tablet by  mouth daily.         Marland Kitchen FISH OIL 1000 MG PO CAPS   Oral   Take 2 capsules by mouth daily.         Marland Kitchen OMEPRAZOLE 20 MG PO CPDR   Oral   Take 20 mg by mouth daily.          . OXYBUTYNIN CHLORIDE ER 5 MG PO TB24   Oral   Take 5 mg by mouth daily.         Marland Kitchen POLYETHYLENE GLYCOL 3350 PO PACK   Oral   Take 17 g by mouth daily.         . TRAMADOL HCL 50 MG PO TABS   Oral   Take 50 mg by mouth every 6 (six) hours as needed. For pain.         Marland Kitchen VITAMIN C 500 MG PO TABS   Oral   Take 500 mg by mouth daily.         Marland Kitchen DEXTROMETHORPHAN POLISTIREX ER 30 MG/5ML PO LQCR   Oral   Take 10 mLs (60 mg total) by mouth as needed for cough.   89 mL   0     BP 131/67  Pulse 65  Temp 99.1 F (37.3 C) (Oral)  Resp 20  SpO2 95%  Physical Exam  Nursing note and vitals reviewed. Constitutional: He is oriented to person, place, and time. He appears well-developed and well-nourished.  HENT:  Head: Normocephalic and atraumatic.  Nose: Nose normal.  Mouth/Throat: Oropharynx is clear and moist. No oropharyngeal exudate.  Eyes: Pupils are equal, round, and reactive to light.  Neck: Normal range of motion.  Cardiovascular: Normal rate and regular rhythm.   Pulmonary/Chest: Effort normal. He has no wheezes. He exhibits no tenderness.  Abdominal: Soft. Bowel sounds are normal. He exhibits no distension.  Musculoskeletal: Normal range of motion.  Neurological: He is alert and oriented to person, place, and time.  Skin: Skin is warm. No pallor.    ED Course  Procedures (including critical care time)  Labs Reviewed  BASIC METABOLIC PANEL - Abnormal; Notable for the following:    Glucose, Bld 120 (*)     BUN 26 (*)     GFR calc non Af Amer 86 (*)     All other components within normal limits  CBC - Abnormal; Notable for the following:    RBC 4.02 (*)     Hemoglobin 12.6 (*)     HCT 37.4 (*)     All other components  within normal limits  POCT I-STAT TROPONIN I   Dg Chest 2  View (if Patient Has Fever And/or Copd)  07/09/2012  *RADIOLOGY REPORT*  Clinical Data: Shortness of breath and cough.  CHEST - 2 VIEW  Comparison: 12/07/2011  Findings: Two views of the chest demonstrate clear lungs. Heart and mediastinum are within normal limits.  Trachea is midline.  IMPRESSION: No acute chest findings.   Original Report Authenticated By: Richarda Overlie, M.D.      1. DIZZINESS, CHRONIC   2. URI (upper respiratory infection)   3. Cough     ED ECG REPORT   Date: 07/09/2012  EKG Time: 9:38 PM  Rate: 63  Rhythm: normal sinus rhythm,  normal EKG, normal sinus rhythm  Axis: normal  Intervals:none  ST&T Change: none  Narrative Interpretation: normal            MDM  Discusses with Dr. Fonnie Jarvis who agrees with DC home  Will prescribe Delsym for cough and  Have patient FU with PCP       Arman Filter, NP 07/09/12 2139

## 2012-11-21 ENCOUNTER — Ambulatory Visit (INDEPENDENT_AMBULATORY_CARE_PROVIDER_SITE_OTHER): Payer: Medicare Other | Admitting: Cardiology

## 2012-11-21 ENCOUNTER — Encounter: Payer: Self-pay | Admitting: Cardiology

## 2012-11-21 VITALS — BP 148/86 | HR 98 | Ht 72.0 in | Wt 224.8 lb

## 2012-11-21 DIAGNOSIS — R079 Chest pain, unspecified: Secondary | ICD-10-CM

## 2012-11-21 DIAGNOSIS — R0789 Other chest pain: Secondary | ICD-10-CM

## 2012-11-21 DIAGNOSIS — R0989 Other specified symptoms and signs involving the circulatory and respiratory systems: Secondary | ICD-10-CM

## 2012-11-21 LAB — TROPONIN I: Troponin I: <0.3 ng/mL (ref <0.30)

## 2012-11-21 MED ORDER — NITROGLYCERIN 0.4 MG SL SUBL: 0.4000 mg | SUBLINGUAL_TABLET | SUBLINGUAL | Status: DC | PRN

## 2012-11-21 MED ORDER — ISOSORBIDE MONONITRATE ER 30 MG PO TB24: 30.0000 mg | ORAL_TABLET | Freq: Every day | ORAL | Status: DC

## 2012-11-21 NOTE — Patient Instructions (Addendum)
Will obtain labs today and call you with the results (bmet/ckmb/cbc/troponin)  START IMDUR 30 MG DAILY  Use your NTG under your tongue for recurrent chest pain. May take one tablet every 5 minutes. If you are still having discomfort after 3 tablets in 15 minutes, call 911.  RETURN ON Thursday TO SEE DR ROSS/DR Patty Sermons

## 2012-11-21 NOTE — Progress Notes (Signed)
Lonzo Cloud Date of Birth:  1940/10/29 [facility] [address] [place], [place]  [address] [phone]         Fax   [phone]  History of Present Illness: This 72 year old gentleman, a patient of Dr. Tenny Craw, is seen by me as a work in on the office DOD schedule.  He comes in because of complaints of shortness of breath and tightness in his chest.  He states that this has been going on quite a long time.  The reason he came in today was because his girlfriend/significant other told him last weekend that he had been complaining so long about the symptoms that he really needed to see his doctor.  He states that the symptoms are no worse now than they were a week or 2 ago.  He has had a dry cough.  He has not been having any fever.  He has a history of known ischemic heart disease and had a drug-eluting stent to the LAD in 2009 and he had a repeat drug-eluting stent to the LAD in July 2010 which was placed proximal to the original stent.  He has not been seen in the office since last June.  In June 2013 he was seen by Norma Fredrickson ANP for similar symptoms as he describes today.  In June 2013 he underwent a nuclear stress test which showed no evidence of ischemia and his ejection fraction was 62%.  He also underwent an echocardiogram which showed an ejection fraction of 50% with inferoseptal hypokinesis.  He underwent a carotid Doppler which showed only mild disease which was done on 12/20/11 and was suggested to be repeated in a year. The patient had a chest x-ray on 07/09/12 showing in appearance and [facility] long emergency room for dizziness and at that time his chest x-ray was normal. While visiting his daughter in [place] in February 2014 the patient developed hematuria and was found to have a 9 mm kidney stone which was treated at [facility] in [place] outside of [place]. [facility] In addition to the  medications listed below, the patient states that he is also on a statin but does not know which one.  Current Outpatient Prescriptions  Medication Sig Dispense Refill  . aspirin EC 81 MG tablet Take 81 mg by mouth daily.      . cholecalciferol (VITAMIN D) 1000 UNITS tablet Take 1,000 Units by mouth daily.      . clopidogrel (PLAVIX) 75 MG tablet Take 75 mg by mouth daily.      . Coenzyme Q10 (CO Q 10) 100 MG CAPS Take 2 capsules by mouth daily.       Marland Kitchen dextromethorphan (DELSYM) 30 MG/5ML liquid Take 10 mLs (60 mg total) by mouth as needed for cough.  89 mL  0  . Flaxseed, Linseed, (FLAX SEED OIL) 1000 MG CAPS Take 1 capsule by mouth daily.       . Multiple Vitamin (MULTIVITAMIN WITH MINERALS) TABS Take 1 tablet by mouth daily.      . Omega-3 Fatty Acids (FISH OIL) 1000 MG CAPS Take 2 capsules by mouth daily.      Marland Kitchen omeprazole (PRILOSEC) 20 MG capsule Take 20 mg by mouth daily.       Marland Kitchen oxybutynin (DITROPAN-XL) 5 MG 24 hr tablet Take 5 mg by mouth daily.      . polyethylene glycol (MIRALAX / GLYCOLAX) packet Take 17 g  by mouth daily.      . traMADol (ULTRAM) 50 MG tablet Take 50 mg by mouth every 6 (six) hours as needed. For pain.      . vitamin C (ASCORBIC ACID) 500 MG tablet Take 500 mg by mouth daily.      . isosorbide mononitrate (IMDUR) 30 MG 24 hr tablet Take 1 tablet (30 mg total) by mouth daily.  30 tablet  5  . nitroGLYCERIN (NITROSTAT) 0.4 MG SL tablet Place 1 tablet (0.4 mg total) under the tongue every 5 (five) minutes as needed for chest pain.  25 tablet  5   No current facility-administered medications for this visit.    No Known Allergies  Patient Active Problem List   Diagnosis Date Noted  . DIZZINESS, CHRONIC 02/27/2009  . ABDOMINAL PAIN 02/27/2009  . ABDOMINAL PAIN, GENERALIZED 02/27/2009  . EDEMA 12/05/2008  . CHEST PAIN 11/25/2008  . NECK PAIN 11/08/2008  . Shortness of breath 11/08/2008  . HYPERLIPIDEMIA-MIXED 07/22/2008  . HYPERTENSION, BENIGN 07/22/2008  .  CAD, NATIVE VESSEL 07/22/2008    History  Smoking status  . Never Smoker   Smokeless tobacco  . Never Used    History  Alcohol Use  . Yes    Comment: rare    Family History  Problem Relation Age of Onset  . Heart disease Mother   . Hypertension Mother   . Diabetes Mother   . COPD Father   . Aneurysm Brother   . Kidney disease Brother     Review of Systems: Constitutional: no fever chills diaphoresis or fatigue or change in weight.  Head and neck: no hearing loss, no epistaxis, no photophobia or visual disturbance. Respiratory: Positive for shortness of breath and nonproductive cough Cardiovascular: Positive for chest pressure Gastrointestinal: No abdominal distention, no abdominal pain, no change in bowel habits hematochezia or melena. Genitourinary: No dysuria, no frequency, no urgency, no nocturia. Musculoskeletal:No arthralgias, no back pain, no gait disturbance or myalgias. Neurological: No dizziness, no headaches, no numbness, no seizures, no syncope, no weakness, no tremors. Hematologic: No lymphadenopathy, no easy bruising. Psychiatric: No confusion, no hallucinations, no sleep disturbance.    Physical Exam: Filed Vitals:   11/21/12 1513  BP: 148/86  Pulse: 98   the general appearance reveals a well-developed well-nourished gentleman in no acute distress.The head and neck exam reveals pupils equal and reactive.  Extraocular movements are full.  There is no scleral icterus.  The mouth and pharynx are normal.  The neck is supple.  The carotids reveal no bruits.  The jugular venous pressure is normal.  The  thyroid is not enlarged.  There is no lymphadenopathy.  The chest is clear to percussion and auscultation.  There are no rales or rhonchi.  Expansion of the chest is symmetrical.  The precordium is quiet.  The first heart sound is normal.  The second heart sound is physiologically split.  There is no murmur gallop rub or click.  There is no abnormal lift or heave.   The abdomen is soft and nontender.  The bowel sounds are normal.  The liver and spleen are not enlarged.  There are no abdominal masses.  There are no abdominal bruits.  Extremities reveal good pedal pulses.  There is no phlebitis or edema.  There is no cyanosis or clubbing.  Strength is normal and symmetrical in all extremities.  There is no lateralizing weakness.  There are no sensory deficits.  The skin is warm and dry.  There is  no rash.  EKG shows junctional  Rhythm at 99 per minute.  This is new since previous EKG of 07/09/12.  No definite ST-T wave changes of ischemia are seen.  There is borderline depression in lead II.   Assessment / Plan: The patient is difficult to assess.  He insists that his symptoms are really unchanged over the past several weeks.  He only came in today because last Saturday his friend urged him to do so.  He does have known coronary disease with stents in 2009 and 2010.  He has had a normal ischemic workup within the past year with normal Myoview on 12/09/11. He has a new cardiac rhythm today which is of some concern although it does not appear to be causing him any symptoms. I suggested to him that he best course of action might be hospitalization and consideration of cardiac catheterization.  He was not inclined to pursue this because of other commitments.  He is involved in putting on some type of event downtown tomorrow evening.  He has not tried any of the nitroglycerin that he carries in his pocket and in fact has never used it ever.  I suggested that he try using his sublingual nitroglycerin and we called him in a new supply to day.  In addition we will start him on Imdur 30 mg one daily.  He will continue his other medications.  We sent him to the lab for baseline blood work including CBC basal metabolic panel CK-MB and troponin I. he agreed that if his cardiac enzymes came back elevated that he would be willing to go to the hospital.  Otherwise we will plan to recheck  him here in 48 hours and repeat EKG at that time.

## 2012-11-22 LAB — BASIC METABOLIC PANEL
BUN: 27 mg/dL — ABNORMAL HIGH (ref 6–23)
Creatinine, Ser: 1 mg/dL (ref 0.4–1.5)
GFR: 77 mL/min (ref >60.00)
Potassium: 4.4 mEq/L (ref 3.5–5.1)

## 2012-11-22 LAB — CBC WITH DIFFERENTIAL/PLATELET
Basophils Relative: 0.7 % (ref 0.0–3.0)
Eosinophils Absolute: 0.2 10*3/uL (ref 0.0–0.7)
MCHC: 33.9 g/dL (ref 30.0–36.0)
MCV: 93.1 fl (ref 78.0–100.0)
Monocytes Absolute: 0.8 10*3/uL (ref 0.1–1.0)
Neutrophils Relative %: 59 % (ref 43.0–77.0)
RBC: 4.37 Mil/uL (ref 4.22–5.81)

## 2012-11-22 NOTE — Progress Notes (Signed)
Quick Note:  Please report to patient. The recent labs are stable. Continue same medication and careful diet. ______ 

## 2012-11-23 ENCOUNTER — Encounter: Payer: Self-pay | Admitting: *Deleted

## 2012-11-23 ENCOUNTER — Encounter: Payer: Self-pay | Admitting: Internal Medicine

## 2012-11-23 ENCOUNTER — Ambulatory Visit (INDEPENDENT_AMBULATORY_CARE_PROVIDER_SITE_OTHER): Payer: Medicare Other | Admitting: Internal Medicine

## 2012-11-23 VITALS — BP 119/65 | HR 65 | Ht 72.0 in | Wt 225.0 lb

## 2012-11-23 DIAGNOSIS — R079 Chest pain, unspecified: Secondary | ICD-10-CM

## 2012-11-23 LAB — D-DIMER, QUANTITATIVE: D-Dimer, Quant: 1.74 ug/mL-FEU — ABNORMAL HIGH (ref 0.00–0.48)

## 2012-11-23 LAB — CBC WITH DIFFERENTIAL/PLATELET
Basophils Absolute: 0 10*3/uL (ref 0.0–0.1)
Eosinophils Absolute: 0.2 10*3/uL (ref 0.0–0.7)
Lymphocytes Relative: 17.2 % (ref 12.0–46.0)
MCHC: 33.5 g/dL (ref 30.0–36.0)
Neutrophils Relative %: 65.7 % (ref 43.0–77.0)
Platelets: 190 10*3/uL (ref 150.0–400.0)
RBC: 3.85 Mil/uL — ABNORMAL LOW (ref 4.22–5.81)
RDW: 14.8 % — ABNORMAL HIGH (ref 11.5–14.6)

## 2012-11-23 LAB — PROTIME-INR: Prothrombin Time: 10.7 s (ref 10.2–12.4)

## 2012-11-23 NOTE — Progress Notes (Signed)
HPI Patient is a 72 yo with a history of CAD  I saw him a few years ago   He has a history od CAD with DES to LAD  He had a repeat PCTA/DES to LAD in 2010 proximal to first stent.  Echo with LVEF 40% but echo showed LVEF normal at 50 to 55% with mild diastolic dysfunction  Myoview in 2013 was negative. The patient aslo has a history of HTN, HL, lung nodule with neg pet., testicular Ca, kidneys stones, He called me on Tuesday saying that he was noticing more chest pressure  He never calls unless he is having a signif problem He was seen by T Brackbill  He was a difficult historian  It was recomm that he be admitted for cardiac cath.  The patient declinied due to work.  He was set up to be seen by me. Since then he has had a couple spells  He dienes symptoms at rest or any that akes otheres. No Known Allergies  Current Outpatient Prescriptions  Medication Sig Dispense Refill  . aspirin EC 81 MG tablet Take 81 mg by mouth daily.      . cholecalciferol (VITAMIN D) 1000 UNITS tablet Take 1,000 Units by mouth daily.      . clopidogrel (PLAVIX) 75 MG tablet Take 75 mg by mouth daily.      . Coenzyme Q10 (CO Q 10) 100 MG CAPS Take 2 capsules by mouth daily.       . dextromethorphan (DELSYM) 30 MG/5ML liquid Take 10 mLs (60 mg total) by mouth as needed for cough.  89 mL  0  . Flaxseed, Linseed, (FLAX SEED OIL) 1000 MG CAPS Take 1 capsule by mouth daily.       . isosorbide mononitrate (IMDUR) 30 MG 24 hr tablet Take 1 tablet (30 mg total) by mouth daily.  30 tablet  5  . Multiple Vitamin (MULTIVITAMIN WITH MINERALS) TABS Take 1 tablet by mouth daily.      . nitroGLYCERIN (NITROSTAT) 0.4 MG SL tablet Place 1 tablet (0.4 mg total) under the tongue every 5 (five) minutes as needed for chest pain.  25 tablet  5  . Omega-3 Fatty Acids (FISH OIL) 1000 MG CAPS Take 2 capsules by mouth daily.      . omeprazole (PRILOSEC) 20 MG capsule Take 20 mg by mouth daily.       . oxybutynin (DITROPAN-XL) 5 MG 24 hr tablet  Take 5 mg by mouth daily.      . polyethylene glycol (MIRALAX / GLYCOLAX) packet Take 17 g by mouth daily.      . traMADol (ULTRAM) 50 MG tablet Take 50 mg by mouth every 6 (six) hours as needed. For pain.      . vitamin C (ASCORBIC ACID) 500 MG tablet Take 500 mg by mouth daily.       No current facility-administered medications for this visit.    Past Medical History  Diagnosis Date  . HYPERLIPIDEMIA-MIXED 07/22/2008  . HYPERTENSION, BENIGN 07/22/2008  . CAD, NATIVE VESSEL 07/22/2008     a.  s/p DES to LAD in 2009; s/p repeat DES to LAD 11/2008 (prox to previous stent).  . Edema 12/05/2008  . DIZZINESS, CHRONIC 02/27/2009  . Testicular cancer   . DJD (degenerative joint disease)   . Lung nodule     RLL lung nodule (PET CT 1/10 negative);R parotid hypermetabolic on PET 06/2008    Past Surgical History  Procedure Laterality Date  .   Appendectomy    . Tonsillectomy    . Rotator cuff repair      right  . Total hip arthroplasty      right  . Bilateral orchiectomy    . Veins stripped      Family History  Problem Relation Age of Onset  . Heart disease Mother   . Hypertension Mother   . Diabetes Mother   . COPD Father   . Aneurysm Brother   . Kidney disease Brother     History   Social History  . Marital Status: Widowed    Spouse Name: N/A    Number of Children: N/A  . Years of Education: N/A   Occupational History  . Not on file.   Social History Main Topics  . Smoking status: Never Smoker   . Smokeless tobacco: Never Used  . Alcohol Use: Yes     Comment: rare  . Drug Use: No  . Sexually Active: Not Currently   Other Topics Concern  . Not on file   Social History Narrative  . No narrative on file    Review of Systems:  All systems reviewed.  They are negative to the above problem except as previously stated.  Vital Signs: BP 119/65  Pulse 60  Ht 6' (1.829 m)  Wt 225 lb (102.059 kg)  BMI 30.51 kg/m2  Physical Exam  HEENT:  Normocephalic, atraumatic.  EOMI, PERRLA.  Neck: JVP is normal.  No bruits.  Lungs: clear to auscultation. No rales no wheezes.  Heart: Regular rate and rhythm. Normal S1, S2. No S3.   No significant murmurs. PMI not displaced.  Abdomen:  Supple, nontender. Normal bowel sounds. No masses. No hepatomegaly.  Extremities:   Good distal pulses throughout. No lower extremity edema.  Musculoskeletal :moving all extremities.  Neuro:   alert and oriented x3.  CN II-XII grossly intact.  EKG:SR 65 Assessment and Plan:  1.  Dyspnea.  Worrisome  The patient has noticed that it has come on over the past 2 months.  Accompanied sometimes by chest tightness.   I would recomm a left heart cath to redefine his anatomy. Patient understands risks/benefits.  Agrees to proceed.  2.  Accelerated idoventricular rhythm.  He is now in SR  I have reviewed with EP  They do not think it ischemic in orign  Would follow   

## 2012-11-23 NOTE — Patient Instructions (Addendum)
LABS TODAY:  TSH, D-DIMER, CBC, PT/INR

## 2012-11-28 ENCOUNTER — Encounter (HOSPITAL_BASED_OUTPATIENT_CLINIC_OR_DEPARTMENT_OTHER)
Admission: RE | Disposition: A | Discharge status: Home / Self Care | Payer: Self-pay | Source: Ambulatory Visit | Attending: Cardiology

## 2012-11-28 ENCOUNTER — Inpatient Hospital Stay (HOSPITAL_BASED_OUTPATIENT_CLINIC_OR_DEPARTMENT_OTHER)
Admission: RE | Admit: 2012-11-28 | Discharge: 2012-11-28 | Disposition: A | Discharge status: Home / Self Care | Payer: Medicare Other | Source: Ambulatory Visit | Attending: Cardiology | Admitting: Cardiology

## 2012-11-28 ENCOUNTER — Other Ambulatory Visit: Payer: Self-pay | Admitting: Cardiology

## 2012-11-28 DIAGNOSIS — I10 Essential (primary) hypertension: Secondary | ICD-10-CM | POA: Insufficient documentation

## 2012-11-28 DIAGNOSIS — I498 Other specified cardiac arrhythmias: Secondary | ICD-10-CM | POA: Insufficient documentation

## 2012-11-28 DIAGNOSIS — R0989 Other specified symptoms and signs involving the circulatory and respiratory systems: Secondary | ICD-10-CM | POA: Insufficient documentation

## 2012-11-28 DIAGNOSIS — R0789 Other chest pain: Secondary | ICD-10-CM | POA: Insufficient documentation

## 2012-11-28 DIAGNOSIS — I2510 Atherosclerotic heart disease of native coronary artery without angina pectoris: Secondary | ICD-10-CM | POA: Insufficient documentation

## 2012-11-28 DIAGNOSIS — R0609 Other forms of dyspnea: Secondary | ICD-10-CM | POA: Insufficient documentation

## 2012-11-28 DIAGNOSIS — Z9861 Coronary angioplasty status: Secondary | ICD-10-CM | POA: Insufficient documentation

## 2012-11-28 SURGERY — JV LEFT HEART CATHETERIZATION WITH CORONARY ANGIOGRAM
Anesthesia: Moderate Sedation

## 2012-11-28 MED ORDER — SODIUM CHLORIDE 0.9 % IV SOLN: INTRAVENOUS | Status: DC

## 2012-11-28 MED ORDER — SODIUM CHLORIDE 0.9 % IJ SOLN: 3.0000 mL | INTRAMUSCULAR | Status: DC | PRN

## 2012-11-28 MED ORDER — ONDANSETRON HCL 4 MG/2ML IJ SOLN: 4.0000 mg | Freq: Four times a day (QID) | INTRAMUSCULAR | Status: DC | PRN

## 2012-11-28 MED ORDER — ACETAMINOPHEN 325 MG PO TABS: 650.0000 mg | ORAL_TABLET | ORAL | Status: DC | PRN

## 2012-11-28 MED ORDER — SODIUM CHLORIDE 0.9 % IJ SOLN: 3.0000 mL | Freq: Two times a day (BID) | INTRAMUSCULAR | Status: DC

## 2012-11-28 MED ORDER — SODIUM CHLORIDE 0.9 % IV SOLN: 250.0000 mL | INTRAVENOUS | Status: DC | PRN

## 2012-11-28 MED ORDER — AMLODIPINE BESYLATE 5 MG PO TABS: 5.0000 mg | ORAL_TABLET | Freq: Every day | ORAL | Status: DC

## 2012-11-28 MED ORDER — ASPIRIN 81 MG PO CHEW
324.0000 mg | CHEWABLE_TABLET | ORAL | Status: AC
  Administered 2012-11-28: 324 mg via ORAL

## 2012-11-28 MED ORDER — SODIUM CHLORIDE 0.9 % IV SOLN: 1.0000 mL/kg/h | INTRAVENOUS | Status: DC

## 2012-11-28 NOTE — OR Nursing (Signed)
Dr Jordan at bedside to discuss results and treatment plan with pt and family 

## 2012-11-28 NOTE — OR Nursing (Signed)
Tegaderm dressing applied, site level 0, bedrest begins at 1130

## 2012-11-28 NOTE — OR Nursing (Signed)
+  Allen's test right hand 

## 2012-11-28 NOTE — Interval H&P Note (Signed)
History and Physical Interval Note:  11/28/2012 10:12 AM  [name]  has presented today for surgery, with the diagnosis of cp  The various methods of treatment have been discussed with the patient and family. After consideration of risks, benefits and other options for treatment, the patient has consented to  Procedure(s): JV LEFT HEART CATHETERIZATION WITH CORONARY ANGIOGRAM (N/A) as a surgical intervention .  The patient's history has been reviewed, patient examined, no change in status, stable for surgery.  I have reviewed the patient's chart and labs.  Questions were answered to the patient's satisfaction.     [name] [facility] 11/28/2012 10:13 AM

## 2012-11-28 NOTE — OR Nursing (Signed)
Meal served 

## 2012-11-28 NOTE — OR Nursing (Signed)
TR band removed, site level 0, pressure dressing and wrist splint applied, distal circulation intact

## 2012-11-28 NOTE — CV Procedure (Signed)
   Cardiac Catheterization Procedure Note  Name: [name] MRN: [id number] DOB: 19-Nov-1940  Procedure: Left Heart Cath, Selective Coronary Angiography, LV angiography  Indication: 72 yo WM with history of CAD s/p stenting of the proximal to mid LAD presents with increasing anginal symptoms.    Procedural details: We initially used a right radial approach. His right wrist was prepped and draped in a sterile fashion. Anesthesia with 1% lidocaine. The artery was accessed using a modified Seldinger technique and a 5 French sheath was placed in the right radial artery without difficulty. On advancing the wire into the aorta there was noted to be a significant loop in the right innominate artery.This significantly impaired our ability to manipulate the catheters and we were unable to access the right coronary from this approach despite multiple attempts with different catheters. We then switched to a femoral approach. The right groin was prepped, draped, and anesthetized with 1% lidocaine. Using modified Seldinger technique, a 5 French sheath was introduced into the right femoral artery. Standard Judkins catheters were used for coronary angiography and left ventriculography. Catheter exchanges were performed over a guidewire. There were no immediate procedural complications. The patient was transferred to the post catheterization recovery area for further monitoring.  Procedural Findings: Hemodynamics:  AO 181/84 with a mean of 122 mmHg LV 181/24 mmHg   Coronary angiography: Coronary dominance: right  Left mainstem: Normal.  Left anterior descending (LAD): The LAD demonstrates a long area of the stent involving the proximal to mid vessel. The stents are widely patent. There is diffuse disease in the mid to distal vessel up to 30%. The first diagonal is a relatively small branch which has a focal 80% ostial stenosis.  Left circumflex (LCx): The left circumflex is a moderate size vessel which  gives rise to a large first marginal vessel and then continues in the AV groove. There is a 90% ostial stenosis.  Right coronary artery (RCA): The right coronary was very difficult to engage and we're unable to access it with either a JR 4, 3 DRC, or right Amplatz catheter. With difficulty we were able to engage with a left Amplatz 1 catheter. There is a focal 70-80% stenosis in the proximal RCA. There is a 30% lesion in the mid vessel.  Left ventriculography: Left ventricular systolic function is normal, LVEF is estimated at 55-65%, there is no significant mitral regurgitation   Final Conclusions:   1. 2 vessel obstructive coronary disease. There is a high-grade ostial left circumflex lesion and a moderate to severe lesion in the proximal RCA. The stents in the LAD are widely patent. 2. Normal  LV function  Recommendations: We'll increase patient's medical therapy with the addition of Norvasc. He has been intolerant to metoprolol in the past because of bradycardia and dizziness. He is already on a nitrate. Should consider coronary bypass surgery for revascularization. The ostial location of the circumflex lesion would make it difficult to treat percutaneously. I would not use a right radial approach in the future due to a loop in the innominate artery.  [name] [facility] 11/28/2012, 11:19 AM

## 2012-11-28 NOTE — H&P (View-Only) (Signed)
HPI Patient is a 72 yo with a history of CAD  I saw him a few years ago   He has a history od CAD with DES to LAD  He had a repeat PCTA/DES to LAD in 2010 proximal to first stent.  Echo with LVEF 40% but echo showed LVEF normal at 50 to 55% with mild diastolic dysfunction  Myoview in 2013 was negative. The patient aslo has a history of HTN, HL, lung nodule with neg pet., testicular Ca, kidneys stones, He called me on Tuesday saying that he was noticing more chest pressure  He never calls unless he is having a signif problem He was seen by Wylene Simmer  He was a difficult historian  It was recomm that he be admitted for cardiac cath.  The patient declinied due to work.  He was set up to be seen by me. Since then he has had a couple spells  He dienes symptoms at rest or any that akes otheres. No Known Allergies  Current Outpatient Prescriptions  Medication Sig Dispense Refill  . aspirin EC 81 MG tablet Take 81 mg by mouth daily.      . cholecalciferol (VITAMIN D) 1000 UNITS tablet Take 1,000 Units by mouth daily.      . clopidogrel (PLAVIX) 75 MG tablet Take 75 mg by mouth daily.      . Coenzyme Q10 (CO Q 10) 100 MG CAPS Take 2 capsules by mouth daily.       Marland Kitchen dextromethorphan (DELSYM) 30 MG/5ML liquid Take 10 mLs (60 mg total) by mouth as needed for cough.  89 mL  0  . Flaxseed, Linseed, (FLAX SEED OIL) 1000 MG CAPS Take 1 capsule by mouth daily.       . isosorbide mononitrate (IMDUR) 30 MG 24 hr tablet Take 1 tablet (30 mg total) by mouth daily.  30 tablet  5  . Multiple Vitamin (MULTIVITAMIN WITH MINERALS) TABS Take 1 tablet by mouth daily.      . nitroGLYCERIN (NITROSTAT) 0.4 MG SL tablet Place 1 tablet (0.4 mg total) under the tongue every 5 (five) minutes as needed for chest pain.  25 tablet  5  . Omega-3 Fatty Acids (FISH OIL) 1000 MG CAPS Take 2 capsules by mouth daily.      Marland Kitchen omeprazole (PRILOSEC) 20 MG capsule Take 20 mg by mouth daily.       Marland Kitchen oxybutynin (DITROPAN-XL) 5 MG 24 hr tablet  Take 5 mg by mouth daily.      . polyethylene glycol (MIRALAX / GLYCOLAX) packet Take 17 g by mouth daily.      . traMADol (ULTRAM) 50 MG tablet Take 50 mg by mouth every 6 (six) hours as needed. For pain.      . vitamin C (ASCORBIC ACID) 500 MG tablet Take 500 mg by mouth daily.       No current facility-administered medications for this visit.    Past Medical History  Diagnosis Date  . HYPERLIPIDEMIA-MIXED 07/22/2008  . HYPERTENSION, BENIGN 07/22/2008  . CAD, NATIVE VESSEL 07/22/2008     a.  s/p DES to LAD in 2009; s/p repeat DES to LAD 11/2008 (prox to previous stent).  . Edema 12/05/2008  . DIZZINESS, CHRONIC 02/27/2009  . Testicular cancer   . DJD (degenerative joint disease)   . Lung nodule     RLL lung nodule (PET CT 1/10 negative);R parotid hypermetabolic on PET 06/2008    Past Surgical History  Procedure Laterality Date  .  Appendectomy    . Tonsillectomy    . Rotator cuff repair      right  . Total hip arthroplasty      right  . Bilateral orchiectomy    . Veins stripped      Family History  Problem Relation Age of Onset  . Heart disease Mother   . Hypertension Mother   . Diabetes Mother   . COPD Father   . Aneurysm Brother   . Kidney disease Brother     History   Social History  . Marital Status: Widowed    Spouse Name: N/A    Number of Children: N/A  . Years of Education: N/A   Occupational History  . Not on file.   Social History Main Topics  . Smoking status: Never Smoker   . Smokeless tobacco: Never Used  . Alcohol Use: Yes     Comment: rare  . Drug Use: No  . Sexually Active: Not Currently   Other Topics Concern  . Not on file   Social History Narrative  . No narrative on file    Review of Systems:  All systems reviewed.  They are negative to the above problem except as previously stated.  Vital Signs: BP 119/65  Pulse 60  Ht 6' (1.829 m)  Wt 225 lb (102.059 kg)  BMI 30.51 kg/m2  Physical Exam  HEENT:  Normocephalic, atraumatic.  EOMI, PERRLA.  Neck: JVP is normal.  No bruits.  Lungs: clear to auscultation. No rales no wheezes.  Heart: Regular rate and rhythm. Normal S1, S2. No S3.   No significant murmurs. PMI not displaced.  Abdomen:  Supple, nontender. Normal bowel sounds. No masses. No hepatomegaly.  Extremities:   Good distal pulses throughout. No lower extremity edema.  Musculoskeletal :moving all extremities.  Neuro:   alert and oriented x3.  CN II-XII grossly intact.  EKG:SR 65 Assessment and Plan:  1.  Dyspnea.  Worrisome  The patient has noticed that it has come on over the past 2 months.  Accompanied sometimes by chest tightness.   I would recomm a left heart cath to redefine his anatomy. Patient understands risks/benefits.  Agrees to proceed.  2.  Accelerated idoventricular rhythm.  He is now in SR  I have reviewed with EP  They do not think it ischemic in orign  Would follow

## 2012-11-28 NOTE — OR Nursing (Signed)
Discharge instructions reviewed and signed, pt stated understanding, ambulated in hall without difficulty, site level 0, transported to friend's car via wheelchair 

## 2012-11-29 ENCOUNTER — Institutional Professional Consult (permissible substitution) (INDEPENDENT_AMBULATORY_CARE_PROVIDER_SITE_OTHER): Payer: Medicare Other | Admitting: Surgery

## 2012-11-29 ENCOUNTER — Other Ambulatory Visit: Payer: Self-pay | Admitting: *Deleted

## 2012-11-29 ENCOUNTER — Encounter: Payer: Self-pay | Admitting: Surgery

## 2012-11-29 VITALS — BP 122/75 | HR 100 | Resp 20 | Ht 72.0 in | Wt 225.0 lb

## 2012-11-29 DIAGNOSIS — I2510 Atherosclerotic heart disease of native coronary artery without angina pectoris: Secondary | ICD-10-CM

## 2012-11-29 NOTE — Progress Notes (Signed)
     [address]       [place] [address]             [phone]      PCP is DYER,CHRISTOPHER, MD  Referring Provider is Jordan, Peter M, MD  Chief Complaint   Patient presents with   .  Coronary Artery Disease     surgical eval for possible CABG, cardiac cath 11/28/12   HPI:  The patient is a 71-year-old gentleman with a history of coronary disease status post PCI with a DES to the LAD in 2009. He subsequently had a second drug-eluting stent placed in the LAD proximal to the first in June 2010. He has been on Plavix and aspirin since. His last Myoview exam in 2013 was negative. The patient now presents with a several month history of exertional substernal chest discomfort described as a squeezing discomfort associated with shortness of breath, dizziness, and fatigue. The symptoms have been progressing and he said that recently he has been having the symptoms at rest during the day as well as awakening him from sleep. He underwent cardiac catheterization yesterday through a right radial approach followed by a right femoral approach due to the significant loop in the right innominate artery. This showed severe two-vessel coronary disease with a 90% calcified ostial left circumflex stenosis that looks somewhat hazy, and a 70-80% proximal right coronary artery stenosis. The stents in the LAD are widely patent. There is no other significant disease in the LAD. There is a 80% ostial stenosis in a small diagonal branch. Left ventricular ejection fraction was 55-65% with no significant mitral regurgitation.  Past Medical History   Diagnosis  Date   .  HYPERLIPIDEMIA-MIXED  07/22/2008   .  HYPERTENSION, BENIGN  07/22/2008   .  CAD, NATIVE VESSEL  07/22/2008     a. s/p DES to LAD in 2009; s/p repeat DES to LAD 11/2008 (prox to previous stent).   .  Edema  12/05/2008   .  DIZZINESS, CHRONIC  02/27/2009   .  Testicular cancer    .  DJD (degenerative joint disease)    .  Lung nodule     RLL lung nodule (PET CT 1/10 negative);R parotid hypermetabolic on PET 06/2008    Past Surgical History   Procedure  Laterality  Date   .  Appendectomy     .  Tonsillectomy     .  Rotator cuff repair       right   .  Total hip arthroplasty       right   .  Bilateral orchiectomy     .  Veins stripped      Family History   Problem  Relation  Age of Onset   .  Heart disease  Mother    .  Hypertension  Mother    .  Diabetes  Mother    .  COPD  Father    .  Aneurysm  Brother    .  Kidney disease  Brother    Social History  History   Substance Use Topics   .  Smoking status:  Never Smoker   .  Smokeless tobacco:  Never Used   .  Alcohol Use:  Yes      Comment: rare    Current Outpatient Prescriptions   Medication  Sig  Dispense  Refill   .  aspirin EC 81 MG tablet  Take 81 mg by mouth daily.     .    cholecalciferol (VITAMIN D) 1000 UNITS tablet  Take 1,000 Units by mouth daily.     .  clopidogrel (PLAVIX) 75 MG tablet  Take 75 mg by mouth daily.     .  Coenzyme Q10 (CO Q 10) 100 MG CAPS  Take 2 capsules by mouth daily.     .  dextromethorphan (DELSYM) 30 MG/5ML liquid  Take 10 mLs (60 mg total) by mouth as needed for cough.  89 mL  0   .  Flaxseed, Linseed, (FLAX SEED OIL) 1000 MG CAPS  Take 1 capsule by mouth daily.     .  isosorbide mononitrate (IMDUR) 30 MG 24 hr tablet  Take 1 tablet (30 mg total) by mouth daily.  30 tablet  5   .  Multiple Vitamin (MULTIVITAMIN WITH MINERALS) TABS  Take 1 tablet by mouth daily.     .  nitroGLYCERIN (NITROSTAT) 0.4 MG SL tablet  Place 1 tablet (0.4 mg total) under the tongue every 5 (five) minutes as needed for chest pain.  25 tablet  5   .  Omega-3 Fatty Acids (FISH OIL) 1000 MG CAPS  Take 2 capsules by mouth daily.     .  omeprazole (PRILOSEC) 20 MG capsule  Take 20 mg by mouth daily.     .  oxybutynin (DITROPAN-XL) 5 MG 24 hr tablet  Take 5 mg by mouth daily.     .  polyethylene glycol (MIRALAX / GLYCOLAX) packet  Take 17 g by mouth daily.      .  traMADol (ULTRAM) 50 MG tablet  Take 50 mg by mouth every 6 (six) hours as needed. For pain.     .  vitamin C (ASCORBIC ACID) 500 MG tablet  Take 500 mg by mouth daily.     .  amLODipine (NORVASC) 5 MG tablet  Take 1 tablet (5 mg total) by mouth daily.  30 tablet  3    No current facility-administered medications for this visit.   No Known Allergies  Review of Systems  Constitutional: Positive for activity change and fatigue.  HENT:  Pain in teeth. Seen by dentist 09/2012  Eyes:  Floaters.  Respiratory: Positive for cough and shortness of breath.  Cardiovascular: Positive for chest pain, palpitations and leg swelling.  Long history of bilateral varicose veins and underwent bilateral vein stirpping many years ago.  Gastrointestinal: Positive for constipation.  Reflux  Endocrine: Negative.  Genitourinary: Positive for frequency.  Kidney stones. Passed 1 month ago.  Musculoskeletal: Positive for myalgias and arthralgias.  Skin: Negative.  Neurological: Positive for dizziness and headaches.  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  Hematological: Negative.  Psychiatric/Behavioral: Negative.  BP 122/75  Pulse 100  Resp 20  Ht 6' (1.829 m)  Wt 225 lb (102.059 kg)  BMI 30.51 kg/m2  SpO2 95%  Physical Exam  Constitutional: He is oriented to person, place, and time. He appears well-developed and well-nourished. No distress.  HENT:  Head: Normocephalic and atraumatic.  Mouth/Throat: Oropharynx is clear and moist.  Eyes: Conjunctivae and EOM are normal. Pupils are equal, round, and reactive to light.  Neck: Normal range of motion. Neck supple. No JVD present. No thyromegaly present.  Cardiovascular: Normal rate, regular rhythm, normal heart sounds and intact distal pulses. Exam reveals no gallop and no friction rub.  No murmur heard. Bilateral lower extremity varicosities in thighs and lower legs.  Small hematoma over right radial artery.    Pulmonary/Chest: Effort normal   and breath sounds normal. No respiratory distress. He has no rales.  Abdominal: Soft. Bowel sounds are normal. He exhibits no distension and no mass. There is no tenderness.  Musculoskeletal: Normal range of motion. He exhibits no edema.  Lymphadenopathy:  He has no cervical adenopathy.  Neurological: He is alert and oriented to person, place, and time. He has normal strength. No cranial nerve deficit or sensory deficit.  Skin: Skin is warm and dry. No erythema.  Psychiatric: He has a normal mood and affect.  Diagnostic Tests:  Procedure: Left Heart Cath, Selective Coronary Angiography, LV angiography  Indication: 71 yo WM with history of CAD s/p stenting of the proximal to mid LAD presents with increasing anginal symptoms.  Procedural details: We initially used a right radial approach. His right wrist was prepped and draped in a sterile fashion. Anesthesia with 1% lidocaine. The artery was accessed using a modified Seldinger technique and a 5 French sheath was placed in the right radial artery without difficulty. On advancing the wire into the aorta there was noted to be a significant loop in the right innominate artery.This significantly impaired our ability to manipulate the catheters and we were unable to access the right coronary from this approach despite multiple attempts with different catheters. We then switched to a femoral approach. The right groin was prepped, draped, and anesthetized with 1% lidocaine. Using modified Seldinger technique, a 5 French sheath was introduced into the right femoral artery. Standard Judkins catheters were used for coronary angiography and left ventriculography. Catheter exchanges were performed over a guidewire. There were no immediate procedural complications. The patient was transferred to the post catheterization recovery area for further monitoring.  Procedural Findings:  Hemodynamics:  AO 181/84 with a mean of 122 mmHg  LV  181/24 mmHg  Coronary angiography:  Coronary dominance: right  Left mainstem: Normal.  Left anterior descending (LAD): The LAD demonstrates a long area of the stent involving the proximal to mid vessel. The stents are widely patent. There is diffuse disease in the mid to distal vessel up to 30%. The first diagonal is a relatively small branch which has a focal 80% ostial stenosis.  Left circumflex (LCx): The left circumflex is a moderate size vessel which gives rise to a large first marginal vessel and then continues in the AV groove. There is a 90% ostial stenosis.  Right coronary artery (RCA): The right coronary was very difficult to engage and we're unable to access it with either a JR 4, 3 DRC, or right Amplatz catheter. With difficulty we were able to engage with a left Amplatz 1 catheter. There is a focal 70-80% stenosis in the proximal RCA. There is a 30% lesion in the mid vessel.  Left ventriculography: Left ventricular systolic function is normal, LVEF is estimated at 55-65%, there is no significant mitral regurgitation  Final Conclusions:  1. 2 vessel obstructive coronary disease. There is a high-grade ostial left circumflex lesion and a moderate to severe lesion in the proximal RCA. The stents in the LAD are widely patent.  2. Normal LV function  Recommendations: We'll increase patient's medical therapy with the addition of Norvasc. He has been intolerant to metoprolol in the past because of bradycardia and dizziness. He is already on a nitrate. Should consider coronary bypass surgery for revascularization. The ostial location of the circumflex lesion would make it difficult to treat percutaneously. I would not use a right radial approach in the future due to a loop in the innominate artery.    Peter JordanMD,FACC  11/28/2012, 11:19 AM  Impression/Plan:  He has severe two-vessel coronary disease with a calcified, hazy, 90% ostial left circumflex stenosis. He reports having symptoms at rest and  feeling poorly so I think it is best to proceed with surgery as quickly as possible. He took his Plavix this morning I asked him to hold it tomorrow morning and we will plan to do surgery on Friday of this week. 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 I will use both IMA's if they are suitable and if not then I may consider using his left radial artery if the arterial dopplers show that the palmar arch is normal. He had his cath yesterday through the right radial approach so that vessels is not an option. I discussed the operative procedure with the patient and family including alternatives, benefits and risks; including but not limited to bleeding, blood transfusion, infection, stroke, myocardial infarction, graft failure, heart block requiring a permanent pacemaker, organ dysfunction, and death. Delfino G Eblen understands and agrees to proceed.   

## 2012-11-30 ENCOUNTER — Encounter: Payer: Self-pay | Admitting: Surgery

## 2012-11-30 ENCOUNTER — Encounter (HOSPITAL_COMMUNITY)
Admission: RE | Admit: 2012-11-30 | Discharge: 2012-11-30 | Disposition: A | Discharge status: Home / Self Care | Payer: Medicare Other | Source: Ambulatory Visit | Attending: Surgery | Admitting: Surgery

## 2012-11-30 ENCOUNTER — Ambulatory Visit (HOSPITAL_COMMUNITY)
Admission: RE | Admit: 2012-11-30 | Discharge: 2012-11-30 | Disposition: A | Discharge status: Home / Self Care | Payer: Medicare Other | Source: Ambulatory Visit | Attending: Surgery | Admitting: Surgery

## 2012-11-30 ENCOUNTER — Encounter (HOSPITAL_COMMUNITY): Payer: Self-pay

## 2012-11-30 VITALS — BP 126/76 | HR 72 | Temp 98.1°F | Resp 20 | Ht 72.0 in | Wt 224.4 lb

## 2012-11-30 DIAGNOSIS — I2510 Atherosclerotic heart disease of native coronary artery without angina pectoris: Secondary | ICD-10-CM

## 2012-11-30 DIAGNOSIS — Z01810 Encounter for preprocedural cardiovascular examination: Secondary | ICD-10-CM

## 2012-11-30 HISTORY — DX: Shortness of breath: R06.02

## 2012-11-30 HISTORY — DX: Gastro-esophageal reflux disease without esophagitis: K21.9

## 2012-11-30 HISTORY — DX: Constipation, unspecified: K59.00

## 2012-11-30 HISTORY — DX: Cerebral infarction, unspecified: I63.9

## 2012-11-30 HISTORY — DX: Angina pectoris, unspecified: I20.9

## 2012-11-30 HISTORY — DX: Personal history of urinary calculi: Z87.442

## 2012-11-30 LAB — COMPREHENSIVE METABOLIC PANEL
AST: 24 U/L (ref 0–37)
Albumin: 3.9 g/dL (ref 3.5–5.2)
Calcium: 10 mg/dL (ref 8.4–10.5)
Chloride: 103 mEq/L (ref 96–112)
Creatinine, Ser: 0.91 mg/dL (ref 0.50–1.35)
Total Protein: 7.3 g/dL (ref 6.0–8.3)

## 2012-11-30 LAB — PROTIME-INR
INR: 0.99 (ref 0.00–1.49)
Prothrombin Time: 13 seconds (ref 11.6–15.2)

## 2012-11-30 LAB — ABO/RH: ABO/RH(D): B POS

## 2012-11-30 LAB — PULMONARY FUNCTION TEST

## 2012-11-30 LAB — URINALYSIS, ROUTINE W REFLEX MICROSCOPIC
Bilirubin Urine: NEGATIVE
Glucose, UA: NEGATIVE mg/dL
Hgb urine dipstick: NEGATIVE
Protein, ur: NEGATIVE mg/dL
Urobilinogen, UA: 0.2 mg/dL (ref 0.0–1.0)

## 2012-11-30 LAB — CBC
MCH: 31 pg (ref 26.0–34.0)
MCHC: 34 g/dL (ref 30.0–36.0)
MCV: 91.1 fL (ref 78.0–100.0)
Platelets: 232 10*3/uL (ref 150–400)
RDW: 13.9 % (ref 11.5–15.5)
WBC: 5.5 10*3/uL (ref 4.0–10.5)

## 2012-11-30 LAB — SURGICAL PCR SCREEN: Staphylococcus aureus: POSITIVE — AB

## 2012-11-30 LAB — HEMOGLOBIN A1C: Mean Plasma Glucose: 126 mg/dL — ABNORMAL HIGH (ref <117)

## 2012-11-30 MED ORDER — POTASSIUM CHLORIDE 2 MEQ/ML IV SOLN
80.0000 meq | INTRAVENOUS | Status: DC
  Filled 2012-11-30: qty 40

## 2012-11-30 MED ORDER — NITROGLYCERIN IN D5W 200-5 MCG/ML-% IV SOLN
2.0000 ug/min | INTRAVENOUS | Status: AC
  Administered 2012-12-01: 16 ug/min via INTRAVENOUS
  Filled 2012-11-30: qty 250

## 2012-11-30 MED ORDER — PHENYLEPHRINE HCL 10 MG/ML IJ SOLN
30.0000 ug/min | INTRAVENOUS | Status: AC
  Administered 2012-12-01: 50 ug/min via INTRAVENOUS
  Filled 2012-11-30: qty 2

## 2012-11-30 MED ORDER — ALBUTEROL SULFATE (5 MG/ML) 0.5% IN NEBU
2.5000 mg | INHALATION_SOLUTION | Freq: Once | RESPIRATORY_TRACT | Status: AC
  Administered 2012-11-30: 2.5 mg via RESPIRATORY_TRACT

## 2012-11-30 MED ORDER — EPINEPHRINE HCL 1 MG/ML IJ SOLN
0.5000 ug/min | INTRAVENOUS | Status: DC
  Filled 2012-11-30: qty 4

## 2012-11-30 MED ORDER — SODIUM CHLORIDE 0.9 % IV SOLN
INTRAVENOUS | Status: AC
  Administered 2012-12-01: 2.5 [IU]/h via INTRAVENOUS
  Filled 2012-11-30: qty 1

## 2012-11-30 MED ORDER — MAGNESIUM SULFATE 50 % IJ SOLN
40.0000 meq | INTRAMUSCULAR | Status: DC
  Filled 2012-11-30: qty 10

## 2012-11-30 MED ORDER — METOPROLOL TARTRATE 12.5 MG HALF TABLET
12.5000 mg | ORAL_TABLET | Freq: Once | ORAL | Status: AC
  Administered 2012-12-01: 12.5 mg via ORAL
  Filled 2012-11-30: qty 1

## 2012-11-30 MED ORDER — VANCOMYCIN HCL 10 G IV SOLR
1250.0000 mg | INTRAVENOUS | Status: AC
  Administered 2012-12-01: 1250 mg via INTRAVENOUS
  Filled 2012-11-30 (×2): qty 1250

## 2012-11-30 MED ORDER — CHLORHEXIDINE GLUCONATE 4 % EX LIQD: 30.0000 mL | CUTANEOUS | Status: DC

## 2012-11-30 MED ORDER — DEXMEDETOMIDINE HCL IN NACL 400 MCG/100ML IV SOLN
0.1000 ug/kg/h | INTRAVENOUS | Status: AC
  Administered 2012-12-01: 0.2 ug/kg/h via INTRAVENOUS
  Filled 2012-11-30: qty 100

## 2012-11-30 MED ORDER — PLASMA-LYTE 148 IV SOLN
INTRAVENOUS | Status: AC
  Administered 2012-12-01: 09:00:00
  Filled 2012-11-30: qty 2.5

## 2012-11-30 MED ORDER — DEXTROSE 5 % IV SOLN
750.0000 mg | INTRAVENOUS | Status: DC
  Filled 2012-11-30: qty 750

## 2012-11-30 MED ORDER — AMINOCAPROIC ACID 250 MG/ML IV SOLN
INTRAVENOUS | Status: AC
  Administered 2012-12-01: 69 mL/h via INTRAVENOUS
  Filled 2012-11-30: qty 40

## 2012-11-30 MED ORDER — SODIUM CHLORIDE 0.9 % IV SOLN
INTRAVENOUS | Status: DC
  Filled 2012-11-30: qty 30

## 2012-11-30 MED ORDER — DEXTROSE 5 % IV SOLN
1.5000 g | INTRAVENOUS | Status: AC
  Administered 2012-12-01: 1.5 g via INTRAVENOUS
  Administered 2012-12-01: .75 g via INTRAVENOUS
  Filled 2012-11-30 (×2): qty 1.5

## 2012-11-30 MED ORDER — DOPAMINE-DEXTROSE 3.2-5 MG/ML-% IV SOLN
2.0000 ug/kg/min | INTRAVENOUS | Status: DC
  Filled 2012-11-30: qty 250

## 2012-11-30 NOTE — Progress Notes (Signed)
VASCULAR LAB PRELIMINARY  PRELIMINARY  PRELIMINARY  PRELIMINARY  Pre-op Cardiac Surgery  Carotid Findings:  Bilateral:  Less than 39% ICA stenosis.  Vertebral artery flow is antegrade.     Upper Extremity Right Left  Brachial Pressures 118 triphasic 109 triphasic  Radial Waveforms triphasic triphasic  Ulnar Waveforms triphasic triphasic  Palmar Arch (Allen's Test) WNL WNL   Findings:  Doppler waveforms remain normal with ulnar and radial compressions bilaterally.    Lower  Extremity Right Left  Dorsalis Pedis    Anterior Tibial    Posterior Tibial    Ankle/Brachial Indices      Findings:  Palpable pedal pulses x 4.   Jamerson Vonbargen, RVT 11/30/2012, 1:40 PM

## 2012-11-30 NOTE — Progress Notes (Signed)
I spoke with Dorathy Kinsman, she does not need to see patient today- saw him last week.. Prep is prep and clip to inclede lower arms- per Alycia Rossetti.

## 2012-11-30 NOTE — Pre-Procedure Instructions (Addendum)
[facility]  11/30/2012   Your procedure is scheduled on:  Friday, June 13th.  Report to [facility] at 5:30 AM. Enter through the Main Entrance, Take [employer] to 3rd Floor- Short Stay.  Call this number if you have problems the morning of surgery: [phone]   Remember:   Do not eat food or drink liquids after midnight.   Take these medicines the morning of surgery with A SIP OF WATER: amLODipine (NORVASC),isosorbide mononitrate (IMDUR),omeprazole (PRILOSEC).  Take if needed:traMADol Janean Sark), nitroGLYCERIN (NITROSTAT),dextromethorphan (DELSYM).  Stop taking Aspirin, Coumadin, Plavix, Effient and Herbal medications.  Do not take any NSAIDs ie: Ibuprofen,  Advil,Naproxen or any medication containing Aspirin.    Do not wear jewelry, make-up or nail polish.  Do not wear lotions, powders, or perfumes. You may wear deodorant.  Do not shave 48 hours prior to surgery. Men may shave face and neck.  Do not bring valuables to the hospital.  [facility] is not responsible or any belongings or valuables.  Contacts, dentures or bridgework may not be worn into surgery.  Leave suitcase in the car. After surgery it may be brought to your room.  For patients admitted to the hospital, checkout time is 11:00 AM the day of discharge.   Special Instructions: Shower with CHG wash (Bactoshield) tonight and again in the am prior to arriving to hospital.   Please read over the following fact sheets that you were given: Pain Booklet, Coughing and Deep Breathing, Blood Transfusion Information and Surgical Site Infection Prevention

## 2012-12-01 ENCOUNTER — Encounter (HOSPITAL_COMMUNITY)
Admission: RE | Disposition: A | Discharge status: Home Health | Payer: Self-pay | Source: Ambulatory Visit | Attending: Surgery

## 2012-12-01 ENCOUNTER — Encounter (HOSPITAL_COMMUNITY): Payer: Self-pay | Admitting: Surgery

## 2012-12-01 ENCOUNTER — Ambulatory Visit (HOSPITAL_COMMUNITY): Payer: Medicare Other | Admitting: Certified Registered"

## 2012-12-01 ENCOUNTER — Inpatient Hospital Stay (HOSPITAL_COMMUNITY)
Admission: RE | Admit: 2012-12-01 | Discharge: 2012-12-05 | DRG: 236 | Disposition: A | Discharge status: Home Health | Payer: Medicare Other | Source: Ambulatory Visit | Attending: Surgery | Admitting: Surgery

## 2012-12-01 ENCOUNTER — Encounter (HOSPITAL_COMMUNITY): Payer: Self-pay | Admitting: Certified Registered"

## 2012-12-01 ENCOUNTER — Inpatient Hospital Stay (HOSPITAL_COMMUNITY): Payer: Medicare Other

## 2012-12-01 DIAGNOSIS — D62 Acute posthemorrhagic anemia: Secondary | ICD-10-CM | POA: Diagnosis not present

## 2012-12-01 DIAGNOSIS — I8390 Asymptomatic varicose veins of unspecified lower extremity: Secondary | ICD-10-CM | POA: Diagnosis present

## 2012-12-01 DIAGNOSIS — E785 Hyperlipidemia, unspecified: Secondary | ICD-10-CM | POA: Diagnosis present

## 2012-12-01 DIAGNOSIS — I2510 Atherosclerotic heart disease of native coronary artery without angina pectoris: Principal | ICD-10-CM

## 2012-12-01 DIAGNOSIS — R42 Dizziness and giddiness: Secondary | ICD-10-CM | POA: Diagnosis present

## 2012-12-01 DIAGNOSIS — Z951 Presence of aortocoronary bypass graft: Secondary | ICD-10-CM

## 2012-12-01 DIAGNOSIS — I10 Essential (primary) hypertension: Secondary | ICD-10-CM | POA: Diagnosis present

## 2012-12-01 DIAGNOSIS — I2584 Coronary atherosclerosis due to calcified coronary lesion: Secondary | ICD-10-CM | POA: Diagnosis present

## 2012-12-01 DIAGNOSIS — Z9861 Coronary angioplasty status: Secondary | ICD-10-CM

## 2012-12-01 HISTORY — PX: CORONARY ARTERY BYPASS GRAFT: SHX141

## 2012-12-01 LAB — GLUCOSE, CAPILLARY
Glucose-Capillary: 103 mg/dL — ABNORMAL HIGH (ref 70–99)
Glucose-Capillary: 138 mg/dL — ABNORMAL HIGH (ref 70–99)
Glucose-Capillary: 117 mg/dL — ABNORMAL HIGH (ref 70–99)

## 2012-12-01 LAB — POCT I-STAT 3, ART BLOOD GAS (G3+)
Acid-base deficit: 1 mmol/L (ref 0.0–2.0)
Bicarbonate: 25.4 mEq/L — ABNORMAL HIGH (ref 20.0–24.0)
Bicarbonate: 25.5 mEq/L — ABNORMAL HIGH (ref 20.0–24.0)
O2 Saturation: 100 %
Patient temperature: 33
Patient temperature: 37.7
Patient temperature: 37.7
TCO2: 26 mmol/L (ref 0–100)
TCO2: 27 mmol/L (ref 0–100)
TCO2: 27 mmol/L (ref 0–100)
TCO2: 27 mmol/L (ref 0–100)
pCO2 arterial: 37.5 mmHg (ref 35.0–45.0)
pCO2 arterial: 49.2 mmHg — ABNORMAL HIGH (ref 35.0–45.0)
pH, Arterial: 7.405 (ref 7.350–7.450)
pH, Arterial: 7.32 — ABNORMAL LOW (ref 7.350–7.450)
pH, Arterial: 7.344 — ABNORMAL LOW (ref 7.350–7.450)
pH, Arterial: 7.286 — ABNORMAL LOW (ref 7.350–7.450)
pO2, Arterial: 83 mmHg (ref 80.0–100.0)

## 2012-12-01 LAB — CBC
HCT: 27.5 % — ABNORMAL LOW (ref 39.0–52.0)
MCH: 31.8 pg (ref 26.0–34.0)
MCH: 31.2 pg (ref 26.0–34.0)
MCHC: 34.2 g/dL (ref 30.0–36.0)
MCV: 91.4 fL (ref 78.0–100.0)
Platelets: 171 10*3/uL (ref 150–400)
RBC: 2.99 MIL/uL — ABNORMAL LOW (ref 4.22–5.81)
RDW: 14.2 % (ref 11.5–15.5)
WBC: 10 10*3/uL (ref 4.0–10.5)

## 2012-12-01 LAB — POCT I-STAT, CHEM 8
BUN: 17 mg/dL (ref 6–23)
Calcium, Ion: 1.2 mmol/L (ref 1.13–1.30)
Chloride: 107 mEq/L (ref 96–112)
Glucose, Bld: 92 mg/dL (ref 70–99)
HCT: 26 % — ABNORMAL LOW (ref 39.0–52.0)
TCO2: 25 mmol/L (ref 0–100)

## 2012-12-01 LAB — POCT I-STAT 4, (NA,K, GLUC, HGB,HCT)
Glucose, Bld: 120 mg/dL — ABNORMAL HIGH (ref 70–99)
Glucose, Bld: 148 mg/dL — ABNORMAL HIGH (ref 70–99)
HCT: 30 % — ABNORMAL LOW (ref 39.0–52.0)
HCT: 26 % — ABNORMAL LOW (ref 39.0–52.0)
HCT: 29 % — ABNORMAL LOW (ref 39.0–52.0)
Hemoglobin: 10.2 g/dL — ABNORMAL LOW (ref 13.0–17.0)
Hemoglobin: 8.5 g/dL — ABNORMAL LOW (ref 13.0–17.0)
Hemoglobin: 10.2 g/dL — ABNORMAL LOW (ref 13.0–17.0)
Potassium: 4.1 mEq/L (ref 3.5–5.1)
Potassium: 4.8 mEq/L (ref 3.5–5.1)
Potassium: 4.4 mEq/L (ref 3.5–5.1)
Sodium: 141 mEq/L (ref 135–145)
Sodium: 141 mEq/L (ref 135–145)
Sodium: 137 mEq/L (ref 135–145)
Sodium: 139 mEq/L (ref 135–145)
Sodium: 141 mEq/L (ref 135–145)

## 2012-12-01 LAB — POCT I-STAT 3, VENOUS BLOOD GAS (G3P V)
Acid-base deficit: 3 mmol/L — ABNORMAL HIGH (ref 0.0–2.0)
Bicarbonate: 24 mEq/L (ref 20.0–24.0)
O2 Saturation: 72 %
Patient temperature: 33
pO2, Ven: 32 mmHg (ref 30.0–45.0)

## 2012-12-01 LAB — POCT I-STAT GLUCOSE: Glucose, Bld: 101 mg/dL — ABNORMAL HIGH (ref 70–99)

## 2012-12-01 LAB — MAGNESIUM: Magnesium: 2.7 mg/dL — ABNORMAL HIGH (ref 1.5–2.5)

## 2012-12-01 LAB — CREATININE, SERUM: GFR calc non Af Amer: >90 mL/min (ref >90)

## 2012-12-01 LAB — PROTIME-INR
INR: 1.22 (ref 0.00–1.49)
Prothrombin Time: 15.2 seconds (ref 11.6–15.2)

## 2012-12-01 LAB — HEMOGLOBIN AND HEMATOCRIT, BLOOD: Hemoglobin: 7.8 g/dL — ABNORMAL LOW (ref 13.0–17.0)

## 2012-12-01 SURGERY — CORONARY ARTERY BYPASS GRAFTING (CABG)
Anesthesia: General | Site: Chest | Wound class: Clean

## 2012-12-01 MED ORDER — ARTIFICIAL TEARS OP OINT
TOPICAL_OINTMENT | OPHTHALMIC | Status: DC | PRN
  Administered 2012-12-01: 1 via OPHTHALMIC

## 2012-12-01 MED ORDER — DEXMEDETOMIDINE HCL IN NACL 200 MCG/50ML IV SOLN
0.1000 ug/kg/h | INTRAVENOUS | Status: DC
  Filled 2012-12-01: qty 100

## 2012-12-01 MED ORDER — MUPIROCIN 2 % EX OINT
TOPICAL_OINTMENT | Freq: Two times a day (BID) | CUTANEOUS | Status: DC
  Administered 2012-12-01: 22:00:00 via NASAL
  Administered 2012-12-01: 1 via NASAL
  Administered 2012-12-02: 10:00:00 via NASAL
  Administered 2012-12-02: 1 via NASAL
  Administered 2012-12-03 – 2012-12-05 (×5): via NASAL
  Filled 2012-12-01 (×2): qty 22

## 2012-12-01 MED ORDER — MIDAZOLAM HCL 2 MG/2ML IJ SOLN: 2.0000 mg | INTRAMUSCULAR | Status: DC | PRN

## 2012-12-01 MED ORDER — THROMBIN 20000 UNITS EX SOLR
CUTANEOUS | Status: AC
  Filled 2012-12-01: qty 20000

## 2012-12-01 MED ORDER — BISACODYL 10 MG RE SUPP: 10.0000 mg | Freq: Every day | RECTAL | Status: DC

## 2012-12-01 MED ORDER — NITROGLYCERIN IN D5W 200-5 MCG/ML-% IV SOLN
0.0000 ug/min | INTRAVENOUS | Status: DC
  Administered 2012-12-01: 16.67 ug/min via INTRAVENOUS

## 2012-12-01 MED ORDER — ROCURONIUM BROMIDE 100 MG/10ML IV SOLN
INTRAVENOUS | Status: DC | PRN
  Administered 2012-12-01: 50 mg via INTRAVENOUS

## 2012-12-01 MED ORDER — HEPARIN SODIUM (PORCINE) 1000 UNIT/ML IJ SOLN
INTRAMUSCULAR | Status: DC | PRN
  Administered 2012-12-01: 37000 [IU] via INTRAVENOUS

## 2012-12-01 MED ORDER — LACTATED RINGERS IV SOLN
INTRAVENOUS | Status: DC | PRN
  Administered 2012-12-01 (×2): via INTRAVENOUS

## 2012-12-01 MED ORDER — SODIUM CHLORIDE 0.9 % IV SOLN
250.0000 mL | INTRAVENOUS | Status: DC
  Administered 2012-12-01: 250 mL via INTRAVENOUS

## 2012-12-01 MED ORDER — MORPHINE SULFATE 2 MG/ML IJ SOLN
1.0000 mg | INTRAMUSCULAR | Status: AC | PRN
  Administered 2012-12-01 (×3): 2 mg via INTRAVENOUS
  Filled 2012-12-01 (×2): qty 1

## 2012-12-01 MED ORDER — PANTOPRAZOLE SODIUM 40 MG PO TBEC: 40.0000 mg | DELAYED_RELEASE_TABLET | Freq: Every day | ORAL | Status: DC

## 2012-12-01 MED ORDER — GELATIN ABSORBABLE MT POWD
OROMUCOSAL | Status: DC | PRN
  Administered 2012-12-01: 09:00:00 via TOPICAL

## 2012-12-01 MED ORDER — ALBUMIN HUMAN 5 % IV SOLN
INTRAVENOUS | Status: DC | PRN
  Administered 2012-12-01: 12:00:00 via INTRAVENOUS

## 2012-12-01 MED ORDER — PHENYLEPHRINE HCL 10 MG/ML IJ SOLN
0.0000 ug/min | INTRAVENOUS | Status: DC
  Filled 2012-12-01 (×2): qty 2

## 2012-12-01 MED ORDER — POTASSIUM CHLORIDE 10 MEQ/50ML IV SOLN: 10.0000 meq | INTRAVENOUS | Status: AC

## 2012-12-01 MED ORDER — ASPIRIN EC 325 MG PO TBEC
325.0000 mg | DELAYED_RELEASE_TABLET | Freq: Every day | ORAL | Status: DC
  Administered 2012-12-02 – 2012-12-03 (×2): 325 mg via ORAL
  Filled 2012-12-01 (×2): qty 1

## 2012-12-01 MED ORDER — METOPROLOL TARTRATE 12.5 MG HALF TABLET
12.5000 mg | ORAL_TABLET | Freq: Two times a day (BID) | ORAL | Status: DC
  Administered 2012-12-02 – 2012-12-05 (×5): 12.5 mg via ORAL
  Filled 2012-12-01 (×9): qty 1

## 2012-12-01 MED ORDER — OXYBUTYNIN CHLORIDE ER 5 MG PO TB24
5.0000 mg | ORAL_TABLET | Freq: Every day | ORAL | Status: DC
  Administered 2012-12-02 – 2012-12-05 (×4): 5 mg via ORAL
  Filled 2012-12-01 (×5): qty 1

## 2012-12-01 MED ORDER — LACTATED RINGERS IV SOLN
INTRAVENOUS | Status: DC | PRN
  Administered 2012-12-01 (×2): via INTRAVENOUS

## 2012-12-01 MED ORDER — ASPIRIN 81 MG PO CHEW: 324.0000 mg | CHEWABLE_TABLET | Freq: Every day | ORAL | Status: DC

## 2012-12-01 MED ORDER — 0.9 % SODIUM CHLORIDE (POUR BTL) OPTIME
TOPICAL | Status: DC | PRN
  Administered 2012-12-01: 6000 mL

## 2012-12-01 MED ORDER — BISACODYL 5 MG PO TBEC
10.0000 mg | DELAYED_RELEASE_TABLET | Freq: Every day | ORAL | Status: DC
  Administered 2012-12-02 – 2012-12-04 (×3): 10 mg via ORAL
  Filled 2012-12-01 (×4): qty 2

## 2012-12-01 MED ORDER — OXYCODONE HCL 5 MG PO TABS
5.0000 mg | ORAL_TABLET | ORAL | Status: DC | PRN
  Administered 2012-12-02 (×2): 10 mg via ORAL
  Administered 2012-12-02: 5 mg via ORAL
  Administered 2012-12-03 – 2012-12-04 (×5): 10 mg via ORAL
  Filled 2012-12-01 (×6): qty 2
  Filled 2012-12-01: qty 1
  Filled 2012-12-01 (×2): qty 2

## 2012-12-01 MED ORDER — PROPOFOL 10 MG/ML IV BOLUS
INTRAVENOUS | Status: DC | PRN
  Administered 2012-12-01: 50 mg via INTRAVENOUS

## 2012-12-01 MED ORDER — MAGNESIUM SULFATE 40 MG/ML IJ SOLN
4.0000 g | Freq: Once | INTRAMUSCULAR | Status: AC
  Administered 2012-12-01: 4 g via INTRAVENOUS

## 2012-12-01 MED ORDER — METOPROLOL TARTRATE 1 MG/ML IV SOLN: 2.5000 mg | INTRAVENOUS | Status: DC | PRN

## 2012-12-01 MED ORDER — SODIUM CHLORIDE 0.9 % IV SOLN: INTRAVENOUS | Status: DC

## 2012-12-01 MED ORDER — ACETAMINOPHEN 500 MG PO TABS
1000.0000 mg | ORAL_TABLET | Freq: Four times a day (QID) | ORAL | Status: DC
  Administered 2012-12-02 – 2012-12-04 (×11): 1000 mg via ORAL
  Filled 2012-12-01 (×18): qty 2

## 2012-12-01 MED ORDER — VECURONIUM BROMIDE 10 MG IV SOLR
INTRAVENOUS | Status: DC | PRN
  Administered 2012-12-01: 6 mg via INTRAVENOUS
  Administered 2012-12-01 (×2): 4 mg via INTRAVENOUS

## 2012-12-01 MED ORDER — THROMBIN 20000 UNITS EX SOLR
OROMUCOSAL | Status: DC | PRN
  Administered 2012-12-01: 09:00:00 via TOPICAL

## 2012-12-01 MED ORDER — SODIUM CHLORIDE 0.45 % IV SOLN
INTRAVENOUS | Status: DC
  Administered 2012-12-01: 13:00:00 via INTRAVENOUS

## 2012-12-01 MED ORDER — ACETAMINOPHEN 160 MG/5ML PO SOLN: 975.0000 mg | Freq: Four times a day (QID) | ORAL | Status: DC

## 2012-12-01 MED ORDER — ALBUMIN HUMAN 5 % IV SOLN
250.0000 mL | INTRAVENOUS | Status: AC | PRN
  Administered 2012-12-01 (×2): 250 mL via INTRAVENOUS
  Filled 2012-12-01: qty 500

## 2012-12-01 MED ORDER — MAGNESIUM SULFATE 40 MG/ML IJ SOLN
INTRAMUSCULAR | Status: AC
  Administered 2012-12-01: 4 g
  Filled 2012-12-01: qty 100

## 2012-12-01 MED ORDER — METOCLOPRAMIDE HCL 5 MG/ML IJ SOLN
10.0000 mg | Freq: Four times a day (QID) | INTRAMUSCULAR | Status: DC
  Filled 2012-12-01 (×4): qty 2

## 2012-12-01 MED ORDER — ACETAMINOPHEN 10 MG/ML IV SOLN
1000.0000 mg | Freq: Once | INTRAVENOUS | Status: AC
  Administered 2012-12-01: 1000 mg via INTRAVENOUS
  Filled 2012-12-01: qty 100

## 2012-12-01 MED ORDER — HEMOSTATIC AGENTS (NO CHARGE) OPTIME
TOPICAL | Status: DC | PRN
  Administered 2012-12-01: 1 via TOPICAL

## 2012-12-01 MED ORDER — FENTANYL CITRATE 0.05 MG/ML IJ SOLN
INTRAMUSCULAR | Status: DC | PRN
  Administered 2012-12-01: 250 ug via INTRAVENOUS
  Administered 2012-12-01: 50 ug via INTRAVENOUS
  Administered 2012-12-01: 250 ug via INTRAVENOUS
  Administered 2012-12-01: 100 ug via INTRAVENOUS
  Administered 2012-12-01: 200 ug via INTRAVENOUS
  Administered 2012-12-01: 50 ug via INTRAVENOUS
  Administered 2012-12-01 (×3): 250 ug via INTRAVENOUS
  Administered 2012-12-01: 100 ug via INTRAVENOUS

## 2012-12-01 MED ORDER — PROTAMINE SULFATE 10 MG/ML IV SOLN
INTRAVENOUS | Status: DC | PRN
  Administered 2012-12-01: 350 mg via INTRAVENOUS

## 2012-12-01 MED ORDER — PANTOPRAZOLE SODIUM 40 MG PO TBEC
40.0000 mg | DELAYED_RELEASE_TABLET | Freq: Every day | ORAL | Status: DC
  Administered 2012-12-03: 40 mg via ORAL
  Filled 2012-12-01: qty 1

## 2012-12-01 MED ORDER — METOPROLOL TARTRATE 25 MG/10 ML ORAL SUSPENSION
12.5000 mg | Freq: Two times a day (BID) | ORAL | Status: DC
  Filled 2012-12-01 (×3): qty 5

## 2012-12-01 MED ORDER — CLOPIDOGREL BISULFATE 75 MG PO TABS
75.0000 mg | ORAL_TABLET | Freq: Every day | ORAL | Status: DC
  Administered 2012-12-02 – 2012-12-05 (×4): 75 mg via ORAL
  Filled 2012-12-01 (×4): qty 1

## 2012-12-01 MED ORDER — ONDANSETRON HCL 4 MG/2ML IJ SOLN: 4.0000 mg | Freq: Four times a day (QID) | INTRAMUSCULAR | Status: DC | PRN

## 2012-12-01 MED ORDER — DEXTROSE 5 % IV SOLN
1.5000 g | Freq: Two times a day (BID) | INTRAVENOUS | Status: AC
  Administered 2012-12-01 – 2012-12-03 (×4): 1.5 g via INTRAVENOUS
  Filled 2012-12-01 (×4): qty 1.5

## 2012-12-01 MED ORDER — VITAMIN D3 25 MCG (1000 UNIT) PO TABS
1000.0000 [IU] | ORAL_TABLET | Freq: Every day | ORAL | Status: DC
  Administered 2012-12-02 – 2012-12-05 (×4): 1000 [IU] via ORAL
  Filled 2012-12-01 (×4): qty 1

## 2012-12-01 MED ORDER — INSULIN REGULAR BOLUS VIA INFUSION
0.0000 [IU] | Freq: Three times a day (TID) | INTRAVENOUS | Status: DC
  Filled 2012-12-01: qty 10

## 2012-12-01 MED ORDER — LACTATED RINGERS IV SOLN
INTRAVENOUS | Status: DC
  Administered 2012-12-01: 13:00:00 via INTRAVENOUS

## 2012-12-01 MED ORDER — METOCLOPRAMIDE HCL 5 MG/ML IJ SOLN
10.0000 mg | Freq: Four times a day (QID) | INTRAMUSCULAR | Status: AC
  Administered 2012-12-01 – 2012-12-02 (×4): 10 mg via INTRAVENOUS
  Filled 2012-12-01 (×4): qty 2

## 2012-12-01 MED ORDER — SODIUM CHLORIDE 0.9 % IV SOLN
INTRAVENOUS | Status: DC
  Administered 2012-12-01: 0.5 [IU]/h via INTRAVENOUS
  Filled 2012-12-01 (×3): qty 1

## 2012-12-01 MED ORDER — SODIUM CHLORIDE 0.9 % IJ SOLN
3.0000 mL | Freq: Two times a day (BID) | INTRAMUSCULAR | Status: DC
  Administered 2012-12-02 – 2012-12-04 (×5): 3 mL via INTRAVENOUS

## 2012-12-01 MED ORDER — FAMOTIDINE IN NACL 20-0.9 MG/50ML-% IV SOLN
20.0000 mg | Freq: Two times a day (BID) | INTRAVENOUS | Status: AC
  Administered 2012-12-01: 20 mg via INTRAVENOUS

## 2012-12-01 MED ORDER — LACTATED RINGERS IV SOLN: 500.0000 mL | Freq: Once | INTRAVENOUS | Status: AC | PRN

## 2012-12-01 MED ORDER — LACTATED RINGERS IV SOLN
INTRAVENOUS | Status: DC | PRN
  Administered 2012-12-01: 07:00:00 via INTRAVENOUS

## 2012-12-01 MED ORDER — MIDAZOLAM HCL 5 MG/5ML IJ SOLN
INTRAMUSCULAR | Status: DC | PRN
  Administered 2012-12-01: 2 mg via INTRAVENOUS
  Administered 2012-12-01: 3 mg via INTRAVENOUS
  Administered 2012-12-01: 4 mg via INTRAVENOUS
  Administered 2012-12-01: 3 mg via INTRAVENOUS
  Administered 2012-12-01: 2 mg via INTRAVENOUS

## 2012-12-01 MED ORDER — DOCUSATE SODIUM 100 MG PO CAPS
200.0000 mg | ORAL_CAPSULE | Freq: Every day | ORAL | Status: DC
  Administered 2012-12-02 – 2012-12-05 (×4): 200 mg via ORAL
  Filled 2012-12-01 (×4): qty 2

## 2012-12-01 MED ORDER — SODIUM CHLORIDE 0.9 % IJ SOLN: 3.0000 mL | INTRAMUSCULAR | Status: DC | PRN

## 2012-12-01 MED ORDER — VANCOMYCIN HCL IN DEXTROSE 1-5 GM/200ML-% IV SOLN
1000.0000 mg | Freq: Once | INTRAVENOUS | Status: AC
  Administered 2012-12-01: 1000 mg via INTRAVENOUS
  Filled 2012-12-01: qty 200

## 2012-12-01 MED ORDER — MORPHINE SULFATE 2 MG/ML IJ SOLN
2.0000 mg | INTRAMUSCULAR | Status: DC | PRN
  Administered 2012-12-02 (×2): 2 mg via INTRAVENOUS
  Filled 2012-12-01: qty 1
  Filled 2012-12-01: qty 2

## 2012-12-01 SURGICAL SUPPLY — 107 items
ATTRACTOMAT 16X20 MAGNETIC DRP (DRAPES) ×3 IMPLANT
BAG DECANTER FOR FLEXI CONT (MISCELLANEOUS) ×3 IMPLANT
BANDAGE ELASTIC 4 VELCRO ST LF (GAUZE/BANDAGES/DRESSINGS) IMPLANT
BANDAGE ELASTIC 6 VELCRO ST LF (GAUZE/BANDAGES/DRESSINGS) IMPLANT
BANDAGE GAUZE ELAST BULKY 4 IN (GAUZE/BANDAGES/DRESSINGS) IMPLANT
BASKET HEART (ORDER IN 25'S) (MISCELLANEOUS) ×1
BASKET HEART (ORDER IN 25S) (MISCELLANEOUS) ×2 IMPLANT
BLADE STERNUM SYSTEM 6 (BLADE) ×3 IMPLANT
BLADE SURG 15 STRL LF DISP TIS (BLADE) ×2 IMPLANT
BLADE SURG 15 STRL SS (BLADE) ×1
CANISTER SUCTION 2500CC (MISCELLANEOUS) ×3 IMPLANT
CANNULA VENOUS LOW PROF 34X46 (CANNULA) ×3 IMPLANT
CATH ROBINSON RED A/P 18FR (CATHETERS) ×6 IMPLANT
CATH THORACIC 28FR (CATHETERS) ×3 IMPLANT
CATH THORACIC 28FR RT ANG (CATHETERS) IMPLANT
CATH THORACIC 36FR (CATHETERS) ×3 IMPLANT
CATH THORACIC 36FR RT ANG (CATHETERS) ×3 IMPLANT
CLIP TI MEDIUM 24 (CLIP) IMPLANT
CLIP TI WIDE RED SMALL 24 (CLIP) ×6 IMPLANT
CLOTH BEACON ORANGE TIMEOUT ST (SAFETY) ×3 IMPLANT
COVER MAYO STAND STRL (DRAPES) ×3 IMPLANT
COVER SURGICAL LIGHT HANDLE (MISCELLANEOUS) ×3 IMPLANT
CRADLE DONUT ADULT HEAD (MISCELLANEOUS) ×3 IMPLANT
DRAPE CARDIOVASCULAR INCISE (DRAPES) ×1
DRAPE EXTREMITY T 121X128X90 (DRAPE) ×3 IMPLANT
DRAPE PROXIMA HALF (DRAPES) ×3 IMPLANT
DRAPE SLUSH MACHINE 52X66 (DRAPES) IMPLANT
DRAPE SLUSH/WARMER DISC (DRAPES) ×3 IMPLANT
DRAPE SRG 135X102X78XABS (DRAPES) ×2 IMPLANT
DRSG COVADERM 4X14 (GAUZE/BANDAGES/DRESSINGS) ×3 IMPLANT
ELECT CAUTERY BLADE 6.4 (BLADE) ×6 IMPLANT
ELECT REM PT RETURN 9FT ADLT (ELECTROSURGICAL) ×6
ELECTRODE REM PT RTRN 9FT ADLT (ELECTROSURGICAL) ×4 IMPLANT
GLOVE BIO SURGEON STRL SZ 6 (GLOVE) ×9 IMPLANT
GLOVE BIO SURGEON STRL SZ 6.5 (GLOVE) ×18 IMPLANT
GLOVE BIO SURGEON STRL SZ7 (GLOVE) IMPLANT
GLOVE BIO SURGEON STRL SZ7.5 (GLOVE) IMPLANT
GLOVE BIOGEL PI IND STRL 6 (GLOVE) IMPLANT
GLOVE BIOGEL PI IND STRL 6.5 (GLOVE) ×8 IMPLANT
GLOVE BIOGEL PI IND STRL 7.0 (GLOVE) IMPLANT
GLOVE BIOGEL PI INDICATOR 6 (GLOVE)
GLOVE BIOGEL PI INDICATOR 6.5 (GLOVE) ×4
GLOVE BIOGEL PI INDICATOR 7.0 (GLOVE)
GLOVE EUDERMIC 7 POWDERFREE (GLOVE) ×6 IMPLANT
GLOVE ORTHO TXT STRL SZ7.5 (GLOVE) IMPLANT
GOWN PREVENTION PLUS XLARGE (GOWN DISPOSABLE) ×6 IMPLANT
GOWN STRL NON-REIN LRG LVL3 (GOWN DISPOSABLE) ×18 IMPLANT
HARMONIC SHEARS 14CM COAG (MISCELLANEOUS) ×3 IMPLANT
HEMOSTAT POWDER SURGIFOAM 1G (HEMOSTASIS) ×9 IMPLANT
HEMOSTAT SURGICEL 2X14 (HEMOSTASIS) ×3 IMPLANT
INSERT FOGARTY 61MM (MISCELLANEOUS) IMPLANT
INSERT FOGARTY XLG (MISCELLANEOUS) IMPLANT
KIT BASIN OR (CUSTOM PROCEDURE TRAY) ×3 IMPLANT
KIT CATH CPB BARTLE (MISCELLANEOUS) ×3 IMPLANT
KIT ROOM TURNOVER OR (KITS) ×3 IMPLANT
KIT SUCTION CATH 14FR (SUCTIONS) ×3 IMPLANT
KIT VASOVIEW W/TROCAR VH 2000 (KITS) IMPLANT
NS IRRIG 1000ML POUR BTL (IV SOLUTION) ×18 IMPLANT
PACK OPEN HEART (CUSTOM PROCEDURE TRAY) ×3 IMPLANT
PAD ARMBOARD 7.5X6 YLW CONV (MISCELLANEOUS) ×12 IMPLANT
PAD ELECT DEFIB RADIOL ZOLL (MISCELLANEOUS) ×3 IMPLANT
PENCIL BUTTON HOLSTER BLD 10FT (ELECTRODE) ×3 IMPLANT
PUNCH AORTIC ROTATE 4.0MM (MISCELLANEOUS) IMPLANT
PUNCH AORTIC ROTATE 4.5MM 8IN (MISCELLANEOUS) ×3 IMPLANT
PUNCH AORTIC ROTATE 5MM 8IN (MISCELLANEOUS) IMPLANT
SET CARDIOPLEGIA MPS 5001102 (MISCELLANEOUS) ×3 IMPLANT
SPONGE GAUZE 4X4 12PLY (GAUZE/BANDAGES/DRESSINGS) ×6 IMPLANT
SPONGE INTESTINAL PEANUT (DISPOSABLE) IMPLANT
SPONGE LAP 18X18 X RAY DECT (DISPOSABLE) IMPLANT
SPONGE LAP 4X18 X RAY DECT (DISPOSABLE) ×3 IMPLANT
SUT BONE WAX W31G (SUTURE) ×3 IMPLANT
SUT MNCRL AB 4-0 PS2 18 (SUTURE) IMPLANT
SUT PROLENE 3 0 SH DA (SUTURE) IMPLANT
SUT PROLENE 3 0 SH1 36 (SUTURE) ×3 IMPLANT
SUT PROLENE 4 0 RB 1 (SUTURE)
SUT PROLENE 4 0 SH DA (SUTURE) IMPLANT
SUT PROLENE 4-0 RB1 .5 CRCL 36 (SUTURE) IMPLANT
SUT PROLENE 5 0 C 1 36 (SUTURE) IMPLANT
SUT PROLENE 6 0 C 1 30 (SUTURE) IMPLANT
SUT PROLENE 7 0 BV 1 (SUTURE) IMPLANT
SUT PROLENE 7 0 BV1 MDA (SUTURE) ×3 IMPLANT
SUT PROLENE 8 0 BV175 6 (SUTURE) ×6 IMPLANT
SUT SILK  1 MH (SUTURE) ×1
SUT SILK 1 MH (SUTURE) ×2 IMPLANT
SUT SILK 2 0 SH CR/8 (SUTURE) ×3 IMPLANT
SUT STEEL STERNAL CCS#1 18IN (SUTURE) IMPLANT
SUT STEEL SZ 6 DBL 3X14 BALL (SUTURE) ×9 IMPLANT
SUT VIC AB 1 CTX 36 (SUTURE) ×2
SUT VIC AB 1 CTX36XBRD ANBCTR (SUTURE) ×4 IMPLANT
SUT VIC AB 2-0 CT1 27 (SUTURE)
SUT VIC AB 2-0 CT1 TAPERPNT 27 (SUTURE) IMPLANT
SUT VIC AB 2-0 CTX 27 (SUTURE) IMPLANT
SUT VIC AB 3-0 SH 27 (SUTURE)
SUT VIC AB 3-0 SH 27X BRD (SUTURE) IMPLANT
SUT VIC AB 3-0 X1 27 (SUTURE) ×3 IMPLANT
SUT VICRYL 4-0 PS2 18IN ABS (SUTURE) IMPLANT
SUTURE E-PAK OPEN HEART (SUTURE) ×3 IMPLANT
SYR 50ML SLIP (SYRINGE) ×3 IMPLANT
SYSTEM SAHARA CHEST DRAIN ATS (WOUND CARE) ×3 IMPLANT
TAPE CLOTH SURG 4X10 WHT LF (GAUZE/BANDAGES/DRESSINGS) ×3 IMPLANT
TOWEL OR 17X24 6PK STRL BLUE (TOWEL DISPOSABLE) ×3 IMPLANT
TOWEL OR 17X26 10 PK STRL BLUE (TOWEL DISPOSABLE) ×3 IMPLANT
TRAY FOLEY IC TEMP SENS 14FR (CATHETERS) ×3 IMPLANT
TUBE SUCT INTRACARD DLP 20F (MISCELLANEOUS) ×3 IMPLANT
TUBING INSUFFLATION 10FT LAP (TUBING) ×3 IMPLANT
UNDERPAD 30X30 INCONTINENT (UNDERPADS AND DIAPERS) ×6 IMPLANT
WATER STERILE IRR 1000ML POUR (IV SOLUTION) ×6 IMPLANT

## 2012-12-01 NOTE — Op Note (Signed)
CARDIOVASCULAR SURGERY OPERATIVE NOTE  12/01/2012 [name] [id number]  Surgeon:  Alleen Borne, MD  First Assistant: Lowella Dandy, [facility]   Preoperative Diagnosis:  Severe multi-vessel coronary artery disease   Postoperative Diagnosis:  Same   Procedure:  1. Median Sternotomy 2. Extracorporeal circulation 3.   Coronary artery bypass grafting x 2   Left internal mammary graft to the OM  Right internal mammary graft to the RCA    Anesthesia:  General Endotracheal   Clinical History/Surgical Indication:  The patient is a 72 year old gentleman with a history of coronary disease status post PCI with a DES to the LAD in 2009. He subsequently had a second drug-eluting stent placed in the LAD proximal to the first in June 2010. He has been on Plavix and aspirin since. His last Myoview exam in 2013 was negative. The patient now presents with a several month history of exertional substernal chest discomfort described as a squeezing discomfort associated with shortness of breath, dizziness, and fatigue. The symptoms have been progressing and he said that recently he has been having the symptoms at rest during the day as well as awakening him from sleep. He underwent cardiac catheterization yesterday through a right radial approach followed by a right femoral approach due to the significant loop in the right innominate artery. This showed severe two-vessel coronary disease with a 90% calcified ostial left circumflex stenosis that looks somewhat hazy, and a 70-80% proximal right coronary artery stenosis. The stents in the LAD are widely patent. There is no other significant disease in the LAD. There is a 80% ostial stenosis in a small diagonal branch. Left ventricular ejection fraction was 55-65% with no significant mitral regurgitation. Unfortunately he has had prior bilateral saphenous vein  strippings and has severe bilateral residual varicose vein disease in perforating branches. I will use both IMA's if they are suitable and if not then I may consider using his left radial artery if the arterial dopplers show that the palmar arch is normal. He had his cath yesterday through the right radial approach so that vessels is not an option. I discussed the operative procedure with the patient and family including alternatives, benefits and risks; including but not limited to bleeding, blood transfusion, infection, stroke, myocardial infarction, graft failure, heart block requiring a permanent pacemaker, organ dysfunction, and death. [name] understands and agrees to proceed.     Preparation:  The patient was seen in the preoperative holding area and the correct patient, correct operation were confirmed with the patient after reviewing the medical record and catheterization. The consent was signed by me. Preoperative antibiotics were given. A pulmonary arterial line and radial arterial line were placed by the anesthesia team. The patient was taken back to the operating room and positioned supine on the operating room table. After being placed under general endotracheal anesthesia by the anesthesia team a foley catheter was placed. The neck, chest, abdomen, and both legs were prepped with betadine soap and solution and draped in the usual sterile manner. A surgical time-out was taken and the correct patient and operative procedure were confirmed with the nursing and anesthesia staff.   Cardiopulmonary Bypass:  A median sternotomy was performed. The pericardium was opened in the midline. Right ventricular function appeared normal. The ascending aorta was of normal size and had no palpable plaque. There were no contraindications to aortic cannulation or cross-clamping. The patient was fully systemically heparinized and the ACT was maintained > 400 sec. The proximal aortic arch  was cannulated  with a 22 F aortic cannula for arterial inflow. Venous cannulation was performed via the right atrial appendage using a two-staged venous cannula. An antegrade cardioplegia/vent cannula was inserted into the mid-ascending aorta. Aortic occlusion was performed with a single cross-clamp. Systemic cooling to 32 degrees Centigrade and topical cooling of the heart with iced saline were used. Hyperkalemic antegrade cold blood cardioplegia was used to induce diastolic arrest and was then given at about 20 minute intervals throughout the period of arrest to maintain myocardial temperature at or below 10 degrees centigrade. A temperature probe was inserted into the interventricular septum and an insulating pad was placed in the pericardium.   Left internal mammary harvest:  The left side of the sternum was retracted using the Rultract retractor. The left internal mammary artery was harvested as a pedicle graft. All side branches were clipped. It was a medium-sized vessel of good quality with excellent blood flow. It was ligated distally and divided. It was sprayed with topical papaverine solution to prevent vasospasm.   Coronary arteries:  The coronary arteries were examined.   LAD:  Previously stented. Distal vessel intramyocardial.  LCX:  Large OM branch with mild distal disease  RCA:  Large vessel with no distal disease   Grafts:  1. LIMA to the OM: 1.75 mm. It was sewn end to side using 8-0 prolene continuous suture. 2. RIMA to the RCA:  2.5 mm. It was sewn end to side using 8-0 prolene continuous suture.   Completion:  The patient was rewarmed to 37 degrees Centigrade. The clamp was removed from the LIMA pedicle and there was rapid warming of the septum and return of ventricular fibrillation. The crossclamp was removed with a time of 47 minutes. There was spontaneous return of sinus rhythm. The distal and proximal anastomoses were checked for hemostasis. The position of the grafts was  satisfactory. Two temporary epicardial pacing wires were placed on the right atrium and two on the right ventricle. The patient was weaned from CPB without difficulty on no inotropes. CPB time was 67 minutes. Cardiac output was 6 LPM. Heparin was fully reversed with protamine and the aortic and venous cannulas removed. Hemostasis was achieved. Mediastinal and left pleural drainage tubes were placed. The sternum was closed with double #6 stainless steel wires. The fascia was closed with continuous # 1 vicryl suture. The subcutaneous tissue was closed with 2-0 vicryl continuous suture. The skin was closed with 3-0 vicryl subcuticular suture. All sponge, needle, and instrument counts were reported correct at the end of the case. Dry sterile dressings were placed over the incisions and around the chest tubes which were connected to pleurevac suction. The patient was then transported to the surgical intensive care unit in critical but stable condition.

## 2012-12-01 NOTE — Anesthesia Postprocedure Evaluation (Signed)
Anesthesia Post Note  Patient: [name]  Procedure(s) Performed: Procedure(s) (LRB): CORONARY ARTERY BYPASS GRAFTING (CABG) (N/A)  Anesthesia type: General  Patient location: ICU  Post pain: Pain level controlled  Post assessment: Post-op Vital signs reviewed  Last Vitals:  Filed Vitals:   12/01/12 1316  BP: 123/61  Pulse: 80  Temp: 35 C  Resp: 13    Post vital signs: stable  Level of consciousness: Patient remains intubated per anesthesia plan  Complications: No apparent anesthesia complications

## 2012-12-01 NOTE — Interval H&P Note (Signed)
History and Physical Interval Note:  12/01/2012 7:17 AM  [name]  has presented today for surgery, with the diagnosis of cad  The various methods of treatment have been discussed with the patient and family. After consideration of risks, benefits and other options for treatment, the patient has consented to  Procedure(s) with comments: CORONARY ARTERY BYPASS GRAFTING (CABG) (N/A) - NO EVH RADIAL ARTERY HARVEST (Bilateral) as a surgical intervention .  The patient's history has been reviewed, patient examined, no change in status, stable for surgery.  I have reviewed the patient's chart and labs.  Questions were answered to the patient's satisfaction.    Upper extremity arterial dopplers show normal palmar arch bilaterally. Will insert A-line on the right and save the left radial artery in case it is needed.  Alleen Borne

## 2012-12-01 NOTE — Brief Op Note (Signed)
12/01/2012  11:21 AM  PATIENT:  [name]  72 y.o. male  PRE-OPERATIVE DIAGNOSIS:  cad  POST-OPERATIVE DIAGNOSIS:  Coronary Artery Disease  PROCEDURE:  Procedure(s):  CORONARY ARTERY BYPASS GRAFTING x 2 -LIMA to OM -RIMA to RCA  SURGEON:  Surgeon(s) and Role:    * Alleen Borne, MD - Primary  PHYSICIAN ASSISTANT: Kallen Mccrystal PA-C  ANESTHESIA:   general  EBL:  Total I/O In: 1700 [I.V.:1700] Out: 455 [Urine:455]  BLOOD ADMINISTERED: CELLSAVER  DRAINS: Right and Left Pleural Chest tube, Mediastinal Chest drains   LOCAL MEDICATIONS USED:  NONE  SPECIMEN:  No Specimen  DISPOSITION OF SPECIMEN:  N/A  COUNTS:  YES  TOURNIQUET:  * No tourniquets in log *  DICTATION: .Dragon Dictation  PLAN OF CARE: Admit to inpatient   PATIENT DISPOSITION:  ICU - intubated and hemodynamically stable.   Delay start of Pharmacological VTE agent (>24hrs) due to surgical blood loss or risk of bleeding: yes

## 2012-12-01 NOTE — Progress Notes (Signed)
UR Completed.  [name] Jane [phone] 12/01/2012  

## 2012-12-01 NOTE — Transfer of Care (Signed)
Immediate Anesthesia Transfer of Care Note  Patient: [name]  Procedure(s) Performed: Procedure(s): CORONARY ARTERY BYPASS GRAFTING (CABG) (N/A)  Patient Location: PACU and SICU  Anesthesia Type:General  Level of Consciousness: Patient remains intubated per anesthesia plan  Airway & Oxygen Therapy: Patient remains intubated per anesthesia plan  Post-op Assessment: Report given to SICU RN  Post vital signs: Reviewed and stable  Complications: No apparent anesthesia complications

## 2012-12-01 NOTE — Progress Notes (Signed)
T[facility] BRIEF SICU PROGRESS NOTE  Day of Surgery  S/P Procedure(s) (LRB): CORONARY ARTERY BYPASS GRAFTING (CABG) (N/A)   Extubated uneventfully AV paced w/ stable hemodynamics Chest tube output low UOP excellent Labs okay  Plan: Continue routine early postop  [name] H 12/01/2012 6:20 PM

## 2012-12-01 NOTE — Anesthesia Procedure Notes (Signed)
Procedure Name: Intubation Date/Time: 12/01/2012 8:01 AM Performed by: Sherie Don Pre-anesthesia Checklist: Patient identified, Emergency Drugs available, Suction available, Patient being monitored and Timeout performed Patient Re-evaluated:Patient Re-evaluated prior to inductionOxygen Delivery Method: Circle system utilized Preoxygenation: Pre-oxygenation with 100% oxygen Intubation Type: IV induction Ventilation: Mask ventilation without difficulty Laryngoscope Size: Mac and 4 Grade View: Grade I Tube type: Oral Tube size: 8.0 mm Number of attempts: 1 Airway Equipment and Method: Stylet Placement Confirmation: ETT inserted through vocal cords under direct vision,  positive ETCO2 and breath sounds checked- equal and bilateral Secured at: 22 cm Tube secured with: Tape Dental Injury: Teeth and Oropharynx as per pre-operative assessment

## 2012-12-01 NOTE — Procedures (Signed)
Extubation Procedure Note  Patient Details:   Name: [name] DOB: April 20, 1941 MRN: [id number]   Airway Documentation:  Airway 8 mm (Active)  Secured at (cm) 22 cm 12/01/2012  1:00 PM  Measured From Lips 12/01/2012  1:00 PM  Secured Location Right 12/01/2012  1:00 PM  Secured By Pink Tape 12/01/2012  1:00 PM    Evaluation  O2 sats: 97 Complications: No apparent complications Patient did tolerate procedure well. BBS Clr   Yes. Pt is awake and can follow commands. NIF -25, VC 850. Pt has good cough/gag. Positive cuff leak. Extubated pt to 4L [place]. BBS clr . No stridor. IS on hold due to pt in pain-RN aware  Kandis Nab 12/01/2012, 6:14 PM

## 2012-12-01 NOTE — Preoperative (Signed)
Beta Blockers   Reason not to administer Beta Blockers:taken this am

## 2012-12-01 NOTE — Anesthesia Preprocedure Evaluation (Signed)
Anesthesia Evaluation  Patient identified by MRN, date of birth, ID band Patient awake    Reviewed: Allergy & Precautions, H&P , NPO status , Patient's Chart, lab work & pertinent test results  Airway Mallampati: II      Dental   Pulmonary shortness of breath and with exertion,          Cardiovascular hypertension, + angina + CAD     Neuro/Psych    GI/Hepatic Neg liver ROS, GERD-  ,  Endo/Other  negative endocrine ROS  Renal/GU negative Renal ROS     Musculoskeletal   Abdominal   Peds  Hematology   Anesthesia Other Findings   Reproductive/Obstetrics                           Anesthesia Physical Anesthesia Plan  ASA: IV  Anesthesia Plan: General   Post-op Pain Management:    Induction: Intravenous  Airway Management Planned: Oral ETT  Additional Equipment: PA Cath and Arterial line  Intra-op Plan:   Post-operative Plan: Post-operative intubation/ventilation  Informed Consent: I have reviewed the patients History and Physical, chart, labs and discussed the procedure including the risks, benefits and alternatives for the proposed anesthesia with the patient or authorized representative who has indicated his/her understanding and acceptance.   Dental advisory given  Plan Discussed with: CRNA, Anesthesiologist and Surgeon  Anesthesia Plan Comments:         Anesthesia Quick Evaluation

## 2012-12-01 NOTE — H&P (Signed)
[address]       [place] [address]             [phone]      PCP is Floyde Parkins, MD  Referring Provider is [place], Peter M, MD  Chief Complaint   Patient presents with   .  Coronary Artery Disease     surgical eval for possible CABG, cardiac cath 11/28/12   HPI:  The patient is a 72 year old gentleman with a history of coronary disease status post PCI with a DES to the LAD in 2009. He subsequently had a second drug-eluting stent placed in the LAD proximal to the first in June 2010. He has been on Plavix and aspirin since. His last Myoview exam in 2013 was negative. The patient now presents with a several month history of exertional substernal chest discomfort described as a squeezing discomfort associated with shortness of breath, dizziness, and fatigue. The symptoms have been progressing and he said that recently he has been having the symptoms at rest during the day as well as awakening him from sleep. He underwent cardiac catheterization yesterday through a right radial approach followed by a right femoral approach due to the significant loop in the right innominate artery. This showed severe two-vessel coronary disease with a 90% calcified ostial left circumflex stenosis that looks somewhat hazy, and a 70-80% proximal right coronary artery stenosis. The stents in the LAD are widely patent. There is no other significant disease in the LAD. There is a 80% ostial stenosis in a small diagonal branch. Left ventricular ejection fraction was 55-65% with no significant mitral regurgitation.  Past Medical History   Diagnosis  Date   .  HYPERLIPIDEMIA-MIXED  07/22/2008   .  HYPERTENSION, BENIGN  07/22/2008   .  CAD, NATIVE VESSEL  07/22/2008     a. s/p DES to LAD in 2009; s/p repeat DES to LAD 11/2008 (prox to previous stent).   .  Edema  12/05/2008   .  DIZZINESS, CHRONIC  02/27/2009   .  Testicular cancer    .  DJD (degenerative joint disease)    .  Lung nodule     RLL lung nodule (PET CT 1/10 negative);R parotid hypermetabolic on PET 06/2008    Past Surgical History   Procedure  Laterality  Date   .  Appendectomy     .  Tonsillectomy     .  Rotator cuff repair       right   .  Total hip arthroplasty       right   .  Bilateral orchiectomy     .  Veins stripped      Family History   Problem  Relation  Age of Onset   .  Heart disease  Mother    .  Hypertension  Mother    .  Diabetes  Mother    .  COPD  Father    .  Aneurysm  Brother    .  Kidney disease  Brother    Social History  History   Substance Use Topics   .  Smoking status:  Never Smoker   .  Smokeless tobacco:  Never Used   .  Alcohol Use:  Yes      Comment: rare    Current Outpatient Prescriptions   Medication  Sig  Dispense  Refill   .  aspirin EC 81 MG tablet  Take 81 mg by mouth daily.     Marland Kitchen  cholecalciferol (VITAMIN D) 1000 UNITS tablet  Take 1,000 Units by mouth daily.     .  clopidogrel (PLAVIX) 75 MG tablet  Take 75 mg by mouth daily.     .  Coenzyme Q10 (CO Q 10) 100 MG CAPS  Take 2 capsules by mouth daily.     Marland Kitchen  dextromethorphan (DELSYM) 30 MG/5ML liquid  Take 10 mLs (60 mg total) by mouth as needed for cough.  89 mL  0   .  Flaxseed, Linseed, (FLAX SEED OIL) 1000 MG CAPS  Take 1 capsule by mouth daily.     .  isosorbide mononitrate (IMDUR) 30 MG 24 hr tablet  Take 1 tablet (30 mg total) by mouth daily.  30 tablet  5   .  Multiple Vitamin (MULTIVITAMIN WITH MINERALS) TABS  Take 1 tablet by mouth daily.     .  nitroGLYCERIN (NITROSTAT) 0.4 MG SL tablet  Place 1 tablet (0.4 mg total) under the tongue every 5 (five) minutes as needed for chest pain.  25 tablet  5   .  Omega-3 Fatty Acids (FISH OIL) 1000 MG CAPS  Take 2 capsules by mouth daily.     Marland Kitchen  omeprazole (PRILOSEC) 20 MG capsule  Take 20 mg by mouth daily.     Marland Kitchen  oxybutynin (DITROPAN-XL) 5 MG 24 hr tablet  Take 5 mg by mouth daily.     .  polyethylene glycol (MIRALAX / GLYCOLAX) packet  Take 17 g by mouth daily.      .  traMADol (ULTRAM) 50 MG tablet  Take 50 mg by mouth every 6 (six) hours as needed. For pain.     .  vitamin C (ASCORBIC ACID) 500 MG tablet  Take 500 mg by mouth daily.     Marland Kitchen  amLODipine (NORVASC) 5 MG tablet  Take 1 tablet (5 mg total) by mouth daily.  30 tablet  3    No current facility-administered medications for this visit.   No Known Allergies  Review of Systems  Constitutional: Positive for activity change and fatigue.  HENT:  Pain in teeth. Seen by dentist 09/2012  Eyes:  Floaters.  Respiratory: Positive for cough and shortness of breath.  Cardiovascular: Positive for chest pain, palpitations and leg swelling.  Long history of bilateral varicose veins and underwent bilateral vein stirpping many years ago.  Gastrointestinal: Positive for constipation.  Reflux  Endocrine: Negative.  Genitourinary: Positive for frequency.  Kidney stones. Passed 1 month ago.  Musculoskeletal: Positive for myalgias and arthralgias.  Skin: Negative.  Neurological: Positive for dizziness and headaches.  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  Hematological: Negative.  Psychiatric/Behavioral: Negative.  BP 122/75  Pulse 100  Resp 20  Ht 6' (1.829 m)  Wt 225 lb (102.059 kg)  BMI 30.51 kg/m2  SpO2 95%  Physical Exam  Constitutional: He is oriented to person, place, and time. He appears well-developed and well-nourished. No distress.  HENT:  Head: Normocephalic and atraumatic.  Mouth/Throat: Oropharynx is clear and moist.  Eyes: Conjunctivae and EOM are normal. Pupils are equal, round, and reactive to light.  Neck: Normal range of motion. Neck supple. No JVD present. No thyromegaly present.  Cardiovascular: Normal rate, regular rhythm, normal heart sounds and intact distal pulses. Exam reveals no gallop and no friction rub.  No murmur heard. Bilateral lower extremity varicosities in thighs and lower legs.  Small hematoma over right radial artery.    Pulmonary/Chest: Effort normal  and breath sounds normal. No respiratory distress. He has no rales.  Abdominal: Soft. Bowel sounds are normal. He exhibits no distension and no mass. There is no tenderness.  Musculoskeletal: Normal range of motion. He exhibits no edema.  Lymphadenopathy:  He has no cervical adenopathy.  Neurological: He is alert and oriented to person, place, and time. He has normal strength. No cranial nerve deficit or sensory deficit.  Skin: Skin is warm and dry. No erythema.  Psychiatric: He has a normal mood and affect.  Diagnostic Tests:  Procedure: Left Heart Cath, Selective Coronary Angiography, LV angiography  Indication: 72 yo WM with history of CAD s/p stenting of the proximal to mid LAD presents with increasing anginal symptoms.  Procedural details: We initially used a right radial approach. His right wrist was prepped and draped in a sterile fashion. Anesthesia with 1% lidocaine. The artery was accessed using a modified Seldinger technique and a 5 French sheath was placed in the right radial artery without difficulty. On advancing the wire into the aorta there was noted to be a significant loop in the right innominate artery.This significantly impaired our ability to manipulate the catheters and we were unable to access the right coronary from this approach despite multiple attempts with different catheters. We then switched to a femoral approach. The right groin was prepped, draped, and anesthetized with 1% lidocaine. Using modified Seldinger technique, a 5 French sheath was introduced into the right femoral artery. Standard Judkins catheters were used for coronary angiography and left ventriculography. Catheter exchanges were performed over a guidewire. There were no immediate procedural complications. The patient was transferred to the post catheterization recovery area for further monitoring.  Procedural Findings:  Hemodynamics:  AO 181/84 with a mean of 122 mmHg  LV  181/24 mmHg  Coronary angiography:  Coronary dominance: right  Left mainstem: Normal.  Left anterior descending (LAD): The LAD demonstrates a long area of the stent involving the proximal to mid vessel. The stents are widely patent. There is diffuse disease in the mid to distal vessel up to 30%. The first diagonal is a relatively small branch which has a focal 80% ostial stenosis.  Left circumflex (LCx): The left circumflex is a moderate size vessel which gives rise to a large first marginal vessel and then continues in the AV groove. There is a 90% ostial stenosis.  Right coronary artery (RCA): The right coronary was very difficult to engage and we're unable to access it with either a JR 4, 3 DRC, or right Amplatz catheter. With difficulty we were able to engage with a left Amplatz 1 catheter. There is a focal 70-80% stenosis in the proximal RCA. There is a 30% lesion in the mid vessel.  Left ventriculography: Left ventricular systolic function is normal, LVEF is estimated at 55-65%, there is no significant mitral regurgitation  Final Conclusions:  1. 2 vessel obstructive coronary disease. There is a high-grade ostial left circumflex lesion and a moderate to severe lesion in the proximal RCA. The stents in the LAD are widely patent.  2. Normal LV function  Recommendations: We'll increase patient's medical therapy with the addition of Norvasc. He has been intolerant to metoprolol in the past because of bradycardia and dizziness. He is already on a nitrate. Should consider coronary bypass surgery for revascularization. The ostial location of the circumflex lesion would make it difficult to treat percutaneously. I would not use a right radial approach in the future due to a loop in the innominate artery.  [name] [facility]  11/28/2012, 11:19 AM  Impression/Plan:  He has severe two-vessel coronary disease with a calcified, hazy, 90% ostial left circumflex stenosis. He reports having symptoms at rest and  feeling poorly so I think it is best to proceed with surgery as quickly as possible. He took his Plavix this morning I asked him to hold it tomorrow morning and we will plan to do surgery on Friday of this week. 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 I will use both IMA's if they are suitable and if not then I may consider using his left radial artery if the arterial dopplers show that the palmar arch is normal. He had his cath yesterday through the right radial approach so that vessels is not an option. I discussed the operative procedure with the patient and family including alternatives, benefits and risks; including but not limited to bleeding, blood transfusion, infection, stroke, myocardial infarction, graft failure, heart block requiring a permanent pacemaker, organ dysfunction, and death. Josepha Pigg understands and agrees to proceed.

## 2012-12-02 ENCOUNTER — Inpatient Hospital Stay (HOSPITAL_COMMUNITY): Payer: Medicare Other

## 2012-12-02 DIAGNOSIS — I2510 Atherosclerotic heart disease of native coronary artery without angina pectoris: Principal | ICD-10-CM

## 2012-12-02 LAB — PREPARE PLATELET PHERESIS: Unit division: 0

## 2012-12-02 LAB — GLUCOSE, CAPILLARY
Glucose-Capillary: 118 mg/dL — ABNORMAL HIGH (ref 70–99)
Glucose-Capillary: 114 mg/dL — ABNORMAL HIGH (ref 70–99)
Glucose-Capillary: 105 mg/dL — ABNORMAL HIGH (ref 70–99)
Glucose-Capillary: 102 mg/dL — ABNORMAL HIGH (ref 70–99)

## 2012-12-02 LAB — BASIC METABOLIC PANEL
CO2: 25 mEq/L (ref 19–32)
Calcium: 7.9 mg/dL — ABNORMAL LOW (ref 8.4–10.5)
Creatinine, Ser: 0.69 mg/dL (ref 0.50–1.35)
Glucose, Bld: 114 mg/dL — ABNORMAL HIGH (ref 70–99)

## 2012-12-02 LAB — CBC
Hemoglobin: 9.4 g/dL — ABNORMAL LOW (ref 13.0–17.0)
MCH: 30.7 pg (ref 26.0–34.0)
MCHC: 33.5 g/dL (ref 30.0–36.0)
MCV: 91.5 fL (ref 78.0–100.0)
RBC: 3.06 MIL/uL — ABNORMAL LOW (ref 4.22–5.81)
RDW: 14.3 % (ref 11.5–15.5)

## 2012-12-02 LAB — MAGNESIUM: Magnesium: 2 mg/dL (ref 1.5–2.5)

## 2012-12-02 LAB — POCT I-STAT, CHEM 8
BUN: 11 mg/dL (ref 6–23)
Calcium, Ion: 1.24 mmol/L (ref 1.13–1.30)
Chloride: 100 mEq/L (ref 96–112)
HCT: 33 % — ABNORMAL LOW (ref 39.0–52.0)
Sodium: 136 mEq/L (ref 135–145)
TCO2: 27 mmol/L (ref 0–100)

## 2012-12-02 LAB — CREATININE, SERUM
Creatinine, Ser: 0.72 mg/dL (ref 0.50–1.35)
GFR calc non Af Amer: >90 mL/min (ref >90)

## 2012-12-02 MED ORDER — POTASSIUM CHLORIDE 10 MEQ/50ML IV SOLN
10.0000 meq | INTRAVENOUS | Status: AC
  Administered 2012-12-02 (×3): 10 meq via INTRAVENOUS

## 2012-12-02 MED ORDER — MORPHINE SULFATE 2 MG/ML IJ SOLN
2.0000 mg | INTRAMUSCULAR | Status: DC | PRN
  Administered 2012-12-02 – 2012-12-03 (×6): 2 mg via INTRAVENOUS
  Filled 2012-12-02 (×6): qty 1

## 2012-12-02 MED ORDER — POTASSIUM CHLORIDE 10 MEQ/100ML IV SOLN
INTRAVENOUS | Status: AC
  Filled 2012-12-02: qty 300

## 2012-12-02 MED ORDER — INSULIN ASPART 100 UNIT/ML ~~LOC~~ SOLN
0.0000 [IU] | SUBCUTANEOUS | Status: DC
  Administered 2012-12-02: 2 [IU] via SUBCUTANEOUS

## 2012-12-02 MED ORDER — INSULIN ASPART 100 UNIT/ML ~~LOC~~ SOLN: 0.0000 [IU] | SUBCUTANEOUS | Status: DC

## 2012-12-02 MED ORDER — FUROSEMIDE 10 MG/ML IJ SOLN
20.0000 mg | Freq: Four times a day (QID) | INTRAMUSCULAR | Status: AC
  Administered 2012-12-02 (×3): 20 mg via INTRAVENOUS
  Filled 2012-12-02 (×3): qty 2

## 2012-12-02 MED ORDER — BIOTENE DRY MOUTH MT LIQD
15.0000 mL | Freq: Two times a day (BID) | OROMUCOSAL | Status: DC
  Administered 2012-12-02 (×2): 15 mL via OROMUCOSAL

## 2012-12-02 MED ORDER — INSULIN ASPART 100 UNIT/ML ~~LOC~~ SOLN: 0.0000 [IU] | SUBCUTANEOUS | Status: AC

## 2012-12-02 NOTE — Plan of Care (Signed)
Problem: Phase II Progression Outcomes Goal: Patient extubated within - Outcome: Completed/Met Date Met:  12/02/12 6hrs

## 2012-12-02 NOTE — Progress Notes (Signed)
      [address]       [place] [address]             [phone]        CARDIOTHORACIC SURGERY PROGRESS NOTE   R1 Day Post-Op Procedure(s) (LRB): CORONARY ARTERY BYPASS GRAFTING (CABG) (N/A)  Subjective: Looks good.  Mild soreness in chest. No nausea.  Objective: Vital signs: BP Readings from Last 1 Encounters:  12/02/12 98/55   Pulse Readings from Last 1 Encounters:  12/02/12 79   Resp Readings from Last 1 Encounters:  12/02/12 14   Temp Readings from Last 1 Encounters:  12/02/12 98.6 F (37 C) Oral    Hemodynamics: PAP: (18-49)/(8-24) 39/17 mmHg CO:  [4.4 L/min-7.6 L/min] 6.7 L/min CI:  [1.9 L/min/m2-3.4 L/min/m2] 3 L/min/m2  Physical Exam:  Rhythm:   sinus  Breath sounds: clear  Heart sounds:  RRR  Incisions:  Dressings dry, intact  Abdomen:  Soft, non-distended, non-tender  Extremities:  Warm, well-perfused    Intake/Output from previous day: 06/13 0701 - 06/14 0700 In: 5952.1 [P.O.:480; I.V.:3939.1; Blood:483; IV Piggyback:1050] Out: 3805 [Urine:3105; Chest Tube:700] Intake/Output this shift: Total I/O In: 20 [I.V.:20] Out: 40 [Chest Tube:40]  Lab Results:  Recent Labs  12/01/12 1800 12/02/12 0430  WBC 7.1 8.9  HGB 9.4* 9.4*  HCT 27.5* 28.0*  PLT 171 171   BMET:  Recent Labs  11/30/12 1548  12/01/12 1755 12/01/12 1800 12/02/12 0430  NA 139  < > 142  --  137  K 5.0  < > 3.9  --  3.8  CL 103  --  107  --  107  CO2 27  --   --   --  25  GLUCOSE 85  < > 92  --  114*  BUN 26*  --  17  --  15  CREATININE 0.91  --  0.70 0.72 0.69  CALCIUM 10.0  --   --   --  7.9*  < > = values in this interval not displayed.  CBG (last 3)   Recent Labs  12/02/12 0204 12/02/12 0337 12/02/12 0737  GLUCAP 114* 109* 129*   ABG    Component Value Date/Time   PHART 7.286* 12/01/2012 2014   HCO3 25.7* 12/01/2012 2014   TCO2 27 12/01/2012 2014   ACIDBASEDEF 1.0 12/01/2012 2014   O2SAT 96.0 12/01/2012 2014   CXR: *RADIOLOGY  REPORT*  Clinical Data: Postop CABG  PORTABLE CHEST - 1 VIEW  Comparison: Prior chest x-ray 12/01/2012  Findings: The patient has and extubated and nasogastric tube  removed. Right IJ vascular sheath conveys a Swan-Ganz catheter  into the heart. The tip of the Swan overlies the main pulmonary  outflow tract. Bilateral thoracostomy tubes in stable position. No  pneumothorax. Very low inspiratory volumes with bibasilar  atelectasis. There may be small bilateral pleural effusions.  Stable cardiomegaly. Changes of median sternotomy for CABG.  IMPRESSION:  Decreased inspiratory volumes following extubation with increased  bibasilar atelectasis.  Other support apparatus in stable and satisfactory position.  Original Report Authenticated By: Malachy Moan, M.D.   Assessment/Plan: S/P Procedure(s) (LRB): CORONARY ARTERY BYPASS GRAFTING (CABG) (N/A)  Doing well POD1 Maintaining NSR w/ stable hemodynamics off drips Expected post op acute blood loss anemia, mild, stable Expected post op volume excess, mild   Mobilize  Diuresis  D/C tubes and lines  Routine care   OWEN,CLARENCE H 12/02/2012 8:59 AM

## 2012-12-02 NOTE — Progress Notes (Signed)
TCTS BRIEF SICU PROGRESS NOTE  1 Day Post-Op  S/P Procedure(s) (LRB): CORONARY ARTERY BYPASS GRAFTING (CABG) (N/A)   Stable day NSR w/ stable BP O2 sats 96% on 2 L/min Diuresing well Labs okay  Plan: Continue routine care  OWEN,CLARENCE H 12/02/2012 6:53 PM

## 2012-12-03 ENCOUNTER — Inpatient Hospital Stay (HOSPITAL_COMMUNITY): Payer: Medicare Other

## 2012-12-03 LAB — GLUCOSE, CAPILLARY: Glucose-Capillary: 97 mg/dL (ref 70–99)

## 2012-12-03 LAB — CBC
Hemoglobin: 10.4 g/dL — ABNORMAL LOW (ref 13.0–17.0)
Platelets: 164 10*3/uL (ref 150–400)
RBC: 3.35 MIL/uL — ABNORMAL LOW (ref 4.22–5.81)
WBC: 9 10*3/uL (ref 4.0–10.5)

## 2012-12-03 LAB — BASIC METABOLIC PANEL
Calcium: 8.5 mg/dL (ref 8.4–10.5)
GFR calc Af Amer: >90 mL/min (ref >90)
GFR calc non Af Amer: >90 mL/min (ref >90)
Glucose, Bld: 112 mg/dL — ABNORMAL HIGH (ref 70–99)
Potassium: 3.9 mEq/L (ref 3.5–5.1)
Sodium: 136 mEq/L (ref 135–145)

## 2012-12-03 MED ORDER — PANTOPRAZOLE SODIUM 40 MG PO TBEC
40.0000 mg | DELAYED_RELEASE_TABLET | Freq: Every day | ORAL | Status: DC
  Administered 2012-12-04 – 2012-12-05 (×2): 40 mg via ORAL
  Filled 2012-12-03: qty 1

## 2012-12-03 MED ORDER — SODIUM CHLORIDE 0.9 % IJ SOLN
3.0000 mL | Freq: Two times a day (BID) | INTRAMUSCULAR | Status: DC
  Administered 2012-12-03: 3 mL via INTRAVENOUS

## 2012-12-03 MED ORDER — POTASSIUM CHLORIDE CRYS ER 20 MEQ PO TBCR
20.0000 meq | EXTENDED_RELEASE_TABLET | Freq: Every day | ORAL | Status: DC
  Administered 2012-12-04 – 2012-12-05 (×2): 20 meq via ORAL
  Filled 2012-12-03 (×2): qty 1
  Filled 2012-12-03: qty 2

## 2012-12-03 MED ORDER — ASPIRIN EC 81 MG PO TBEC: 81.0000 mg | DELAYED_RELEASE_TABLET | Freq: Every day | ORAL | Status: DC

## 2012-12-03 MED ORDER — TRAMADOL HCL 50 MG PO TABS
50.0000 mg | ORAL_TABLET | ORAL | Status: DC | PRN
  Administered 2012-12-03 – 2012-12-05 (×3): 100 mg via ORAL
  Filled 2012-12-03 (×4): qty 2

## 2012-12-03 MED ORDER — ALPRAZOLAM 0.25 MG PO TABS: 0.2500 mg | ORAL_TABLET | Freq: Four times a day (QID) | ORAL | Status: DC | PRN

## 2012-12-03 MED ORDER — FUROSEMIDE 40 MG PO TABS
40.0000 mg | ORAL_TABLET | Freq: Every day | ORAL | Status: DC
  Administered 2012-12-03: 40 mg via ORAL
  Filled 2012-12-03 (×2): qty 1

## 2012-12-03 MED ORDER — MAGNESIUM HYDROXIDE 400 MG/5ML PO SUSP
30.0000 mL | Freq: Four times a day (QID) | ORAL | Status: DC | PRN
  Administered 2012-12-03: 30 mL via ORAL
  Filled 2012-12-03: qty 30

## 2012-12-03 MED ORDER — MOVING RIGHT ALONG BOOK
Freq: Once | Status: AC
  Administered 2012-12-03: 11:00:00
  Filled 2012-12-03: qty 1

## 2012-12-03 MED ORDER — MIDAZOLAM HCL 2 MG/2ML IJ SOLN
2.0000 mg | Freq: Once | INTRAMUSCULAR | Status: AC
  Administered 2012-12-03: 2 mg via INTRAVENOUS
  Filled 2012-12-03: qty 2

## 2012-12-03 MED ORDER — ASPIRIN EC 81 MG PO TBEC
81.0000 mg | DELAYED_RELEASE_TABLET | Freq: Every day | ORAL | Status: DC
  Administered 2012-12-04 – 2012-12-05 (×2): 81 mg via ORAL
  Filled 2012-12-03 (×2): qty 1

## 2012-12-03 MED ORDER — SODIUM CHLORIDE 0.9 % IV SOLN: 250.0000 mL | INTRAVENOUS | Status: DC | PRN

## 2012-12-03 MED ORDER — SODIUM CHLORIDE 0.9 % IJ SOLN: 3.0000 mL | INTRAMUSCULAR | Status: DC | PRN

## 2012-12-03 NOTE — Progress Notes (Signed)
      [address]       [place] [address]             [phone]        CARDIOTHORACIC SURGERY PROGRESS NOTE   R2 Days Post-Op Procedure(s) (LRB): CORONARY ARTERY BYPASS GRAFTING (CABG) (N/A)  Subjective: Doing well.  Sore in chest and back but o/w okay  Objective: Vital signs: BP Readings from Last 1 Encounters:  12/03/12 97/38   Pulse Readings from Last 1 Encounters:  12/03/12 81   Resp Readings from Last 1 Encounters:  12/03/12 20   Temp Readings from Last 1 Encounters:  12/03/12 99.1 F (37.3 C) Oral    Hemodynamics: PAP: (38-44)/(15-18) 39/16 mmHg  Physical Exam:  Rhythm:   sinus  Breath sounds: clear  Heart sounds:  RRR  Incisions:  Clean and dry  Abdomen:  soft  Extremities:  warm   Intake/Output from previous day: 06/14 0701 - 06/15 0700 In: 880 [I.V.:680; IV Piggyback:200] Out: 4370 [Urine:3900; Chest Tube:470] Intake/Output this shift: Total I/O In: 40 [I.V.:40] Out: 200 [Urine:200]  Lab Results:  Recent Labs  12/02/12 1610 12/03/12 0440  WBC 10.3 9.0  HGB 10.8*  11.2* 10.4*  HCT 32.2*  33.0* 31.1*  PLT 188 164   BMET:  Recent Labs  12/02/12 0430 12/02/12 1610 12/03/12 0440  NA 137 136 136  K 3.8 4.2 3.9  CL 107 100 101  CO2 25  --  27  GLUCOSE 114* 132* 112*  BUN 15 11 11   CREATININE 0.69 0.72  0.90 0.72  CALCIUM 7.9*  --  8.5    CBG (last 3)   Recent Labs  12/02/12 2350 12/03/12 0436 12/03/12 0728  GLUCAP 86 97 113*   ABG    Component Value Date/Time   PHART 7.286* 12/01/2012 2014   HCO3 25.7* 12/01/2012 2014   TCO2 27 12/02/2012 1610   ACIDBASEDEF 1.0 12/01/2012 2014   O2SAT 96.0 12/01/2012 2014   CXR: *RADIOLOGY REPORT*  Clinical Data: Postop.  PORTABLE CHEST - 1 VIEW  Comparison: And 12/02/2012 peri  Findings: Swan-Ganz catheter has been removed. Introducer catheter  at the level of the proximal superior vena cava.  Bilateral chest tubes are in place (two on the left and one on the    right) as well as mediastinal drain. Questionable medially located  left-sided pneumothorax.  Cardiomegaly.  Pleural effusion greater on the left. Basilar atelectasis.  Central pulmonary vascular prominence.  Calcified mildly tortuous aorta.  IMPRESSION:  Swan-Ganz catheter has been removed. Introducer catheter at the  level of the proximal superior vena cava.  Bilateral chest tubes are in place as well as mediastinal drain.  Questionable medially located left-sided pneumothorax.  Cardiomegaly.  Pleural effusion greater on the left. Basilar atelectasis.  Central pulmonary vascular prominence.  Calcified mildly tortuous aorta.  Original Report Authenticated By: Lacy Duverney, M.D.   Assessment/Plan: S/P Procedure(s) (LRB): CORONARY ARTERY BYPASS GRAFTING (CABG) (N/A)  Doing well POD2 Maintaining NSR w/ stable hemodynamics  Expected post op acute blood loss anemia, mild, stable  Expected post op volume excess, mild  Mobilize  Diuresis  D/C tubes and lines  Transfer 2000   OWEN,CLARENCE H 12/03/2012 10:12 AM

## 2012-12-04 ENCOUNTER — Encounter (HOSPITAL_COMMUNITY): Payer: Self-pay | Admitting: Surgery

## 2012-12-04 ENCOUNTER — Inpatient Hospital Stay (HOSPITAL_COMMUNITY): Payer: Medicare Other

## 2012-12-04 DIAGNOSIS — Z951 Presence of aortocoronary bypass graft: Secondary | ICD-10-CM

## 2012-12-04 LAB — BASIC METABOLIC PANEL
BUN: 15 mg/dL (ref 6–23)
CO2: 29 mEq/L (ref 19–32)
Chloride: 99 mEq/L (ref 96–112)
Creatinine, Ser: 0.82 mg/dL (ref 0.50–1.35)
GFR calc Af Amer: >90 mL/min (ref >90)
Potassium: 3.7 mEq/L (ref 3.5–5.1)

## 2012-12-04 LAB — TYPE AND SCREEN
ABO/RH(D): B POS
Antibody Screen: NEGATIVE

## 2012-12-04 LAB — CBC
HCT: 32.7 % — ABNORMAL LOW (ref 39.0–52.0)
Hemoglobin: 10.9 g/dL — ABNORMAL LOW (ref 13.0–17.0)
MCV: 92.1 fL (ref 78.0–100.0)
RBC: 3.55 MIL/uL — ABNORMAL LOW (ref 4.22–5.81)
RDW: 13.8 % (ref 11.5–15.5)
WBC: 7.5 10*3/uL (ref 4.0–10.5)

## 2012-12-04 MED ORDER — ATORVASTATIN CALCIUM 20 MG PO TABS
20.0000 mg | ORAL_TABLET | Freq: Every day | ORAL | Status: DC
  Administered 2012-12-04: 20 mg via ORAL
  Filled 2012-12-04 (×2): qty 1

## 2012-12-04 MED FILL — Magnesium Sulfate Inj 50%: INTRAMUSCULAR | Qty: 10 | Status: AC

## 2012-12-04 MED FILL — Sodium Chloride IV Soln 0.9%: INTRAVENOUS | Qty: 1000 | Status: AC

## 2012-12-04 MED FILL — Sodium Bicarbonate IV Soln 8.4%: INTRAVENOUS | Qty: 50 | Status: AC

## 2012-12-04 MED FILL — Potassium Chloride Inj 2 mEq/ML: INTRAVENOUS | Qty: 40 | Status: AC

## 2012-12-04 MED FILL — Electrolyte-R (PH 7.4) Solution: INTRAVENOUS | Qty: 4000 | Status: AC

## 2012-12-04 MED FILL — Sodium Chloride Irrigation Soln 0.9%: Qty: 3000 | Status: AC

## 2012-12-04 MED FILL — Lidocaine HCl IV Inj 20 MG/ML: INTRAVENOUS | Qty: 5 | Status: AC

## 2012-12-04 MED FILL — Heparin Sodium (Porcine) Inj 1000 Unit/ML: INTRAMUSCULAR | Qty: 30 | Status: AC

## 2012-12-04 MED FILL — Mannitol IV Soln 20%: INTRAVENOUS | Qty: 500 | Status: AC

## 2012-12-04 NOTE — Progress Notes (Signed)
CARDIAC REHAB PHASE I   PRE:  Rate/Rhythm: 81 SR  BP:  Supine: 100/50  Sitting:   Standing:    SaO2: 98 RA  MODE:  Ambulation: 890 ft   POST:  Rate/Rhythm: 82  BP:  Supine:   Sitting: 104/50  Standing:    SaO2: 94 RA 0840-0925 Pt tolerated ambulation well without c/o. Gait steady without walker. VS stable Pt to recliner after walk with call light in reach. Pt states that his lady friend is now going to be able to come from [facility] to stay with him at discharge. Encouraged him to watch recovering  from heart surgery video.  Melina Copa RN 12/04/2012 9:24 AM

## 2012-12-04 NOTE — Care Management Note (Unsigned)
    Page 1 of 1   12/04/2012     2:03:15 PM   CARE MANAGEMENT NOTE 12/04/2012  Patient:  [name], [name]   Account Number:  [id number]  Date Initiated:  12/01/2012  Documentation initiated by:  Avie Arenas  Subjective/Objective Assessment:   post op CABG x2     Action/Plan:   Anticipated DC Date:  12/06/2012   Anticipated DC Plan:  HOME W HOME HEALTH SERVICES      DC Planning Services  CM consult      Choice offered to / List presented to:             Status of service:  In process, will continue to follow Medicare Important Message given?   (If response is "NO", the following Medicare IM given date fields will be blank) Date Medicare IM given:   Date Additional Medicare IM given:    Discharge Disposition:    Per UR Regulation:  Reviewed for med. necessity/level of care/duration of stay  If discussed at Long Length of Stay Meetings, dates discussed:    Comments:  ContactAvelardo, Reesman Son [phone]                 [facility] Significant other [phone]  12/04/12 Darcel Zick,RN,BSN [phone] PT S/P CABG X 2  ON 12/01/12.  PTA, PT LIVES ALONE, BUT GIRLFRIEND WILL PROVIDE 24HR CARE AT DC.  WILL FOLLOW FOR HOME NEEDS AS PT PROGRESSES.

## 2012-12-04 NOTE — Progress Notes (Addendum)
      [address]       [place] [address]             [phone]     3 Days Post-Op Procedure(s) (LRB): CORONARY ARTERY BYPASS GRAFTING (CABG) (N/A)  Subjective:  [name] states he isn't feeling all that well this morning.  He states he has been experiencing some dizziness when he gets up.  He also complains of pain in his shoulder blades.  He also states he feels weak and somewhat unsteady on his feet. +BM  Objective: Vital signs in last 24 hours: Temp:  [98.2 F (36.8 C)-98.9 F (37.2 C)] 98.2 F (36.8 C) (06/16 0451) Pulse Rate:  [72-97] 86 (06/16 0451) Cardiac Rhythm:  [-] Normal sinus rhythm (06/15 1930) Resp:  [16-21] 18 (06/16 0451) BP: (94-140)/(38-81) 104/66 mmHg (06/16 0451) SpO2:  [92 %-100 %] 96 % (06/16 0451) Weight:  [220 lb 7.4 oz (100 kg)] 220 lb 7.4 oz (100 kg) (06/16 0451)  Intake/Output from previous day: 06/15 0701 - 06/16 0700 In: 40 [I.V.:40] Out: 1815 [Urine:1815]  General appearance: alert, cooperative and no distress Heart: regular rate and rhythm Lungs: diminished breath sounds bibasilar Abdomen: soft, non-tender; bowel sounds normal; no masses,  no organomegaly Extremities: extremities normal, atraumatic, no cyanosis or edema Wound: clean and dry  Lab Results:  Recent Labs  12/03/12 0440 12/04/12 0500  WBC 9.0 7.5  HGB 10.4* 10.9*  HCT 31.1* 32.7*  PLT 164 177   BMET:  Recent Labs  12/03/12 0440 12/04/12 0500  NA 136 137  K 3.9 3.7  CL 101 99  CO2 27 29  GLUCOSE 112* 136*  BUN 11 15  CREATININE 0.72 0.82  CALCIUM 8.5 8.7    PT/INR:  Recent Labs  12/01/12 1305  LABPROT 15.2  INR 1.22   ABG    Component Value Date/Time   PHART 7.286* 12/01/2012 2014   HCO3 25.7* 12/01/2012 2014   TCO2 27 12/02/2012 1610   ACIDBASEDEF 1.0 12/01/2012 2014   O2SAT 96.0 12/01/2012 2014   CBG (last 3)   Recent Labs  12/02/12 2350 12/03/12 0436 12/03/12 0728  GLUCAP 86 97 113*    Assessment/Plan: S/P  Procedure(s) (LRB): CORONARY ARTERY BYPASS GRAFTING (CABG) (N/A)  1. CV- NSR rate and pressure controlled, continue Lopressor 12.5 mg BID 2. Pulm- no acute issues, encouraged use of IS 3. Renal- creatinine normal, volume status is below baseline, patient with dizziness will d/c diuresis 4. Deconditioning- will get PT consult, patient may need SNF at discharge 5. Dispo- patient stable, will d/c EPW today likely d/c in next 24-48 hrs    LOS: 3 days    [name] 12/04/2012  I have seen and examined the patient and agree with the assessment and plan as outlined.  [name] 12/04/2012 8:42 AM

## 2012-12-04 NOTE — Progress Notes (Signed)
D/C EPW at 1045 per protocol. All wires intact upon removal. No arrythmias noted. Vital signs stable. Bed rest for one hour. Call bell in reach. Dion Saucier

## 2012-12-04 NOTE — Progress Notes (Signed)
Patient ID: [name], male   DOB: [birth year], 72 y.o.   MRN: [id number].    Subjective:  Denies SSCP, palpitations or Dyspnea S/P CABG  Objective:  Filed Vitals:   12/03/12 1300 12/03/12 1437 12/03/12 2016 12/04/12 0451  BP: 139/59 94/61 140/81 104/66  Pulse: 92 95 97 86  Temp:  98.7 F (37.1 C) 98.9 F (37.2 C) 98.2 F (36.8 C)  TempSrc:  Oral Oral Oral  Resp: 21 20 19 18   Height:      Weight:    220 lb 7.4 oz (100 kg)  SpO2: 100% 92% 97% 96%    Intake/Output from previous day:  Intake/Output Summary (Last 24 hours) at 12/04/12 0731 Last data filed at 12/04/12 0500  Gross per 24 hour  Intake     40 ml  Output   1815 ml  Net  -1775 ml    Physical Exam: Affect appropriate Healthy:  appears stated age HEENT: normal Neck supple with no adenopathy JVP normal no bruits no thyromegaly Lungs clear with no wheezing and good diaphragmatic motion Heart:  S1/S2 no murmur, no rub, gallop or click sternum stable PMI normal Abdomen: benighn, BS positve, no tenderness, no AAA no bruit.  No HSM or HJR Distal pulses intact with no bruits No edema Neuro non-focal Skin warm and dry No muscular weakness   Lab Results: Basic Metabolic Panel:  Recent Labs  78/29/56 0430 12/02/12 1610 12/03/12 0440 12/04/12 0500  NA 137 136 136 137  K 3.8 4.2 3.9 3.7  CL 107 100 101 99  CO2 25  --  27 29  GLUCOSE 114* 132* 112* 136*  BUN 15 11 11 15   CREATININE 0.69 0.72  0.90 0.72 0.82  CALCIUM 7.9*  --  8.5 8.7  MG 2.2 2.0  --   --    CBC:  Recent Labs  12/03/12 0440 12/04/12 0500  WBC 9.0 7.5  HGB 10.4* 10.9*  HCT 31.1* 32.7*  MCV 92.8 92.1  PLT 164 177    Imaging: Imaging results have been reviewed and Dg Chest Port 1 View  12/03/2012   *RADIOLOGY REPORT*  Clinical Data: Postop.  PORTABLE CHEST - 1 VIEW  Comparison: And 12/02/2012 peri  Findings: Swan-Ganz catheter has been removed.  Introducer catheter at the level of the proximal superior vena cava.   Bilateral chest tubes are in place (two on the left and one on the right) as well as mediastinal drain.  Questionable medially located left-sided pneumothorax.  Cardiomegaly.  Pleural effusion greater on the left.  Basilar atelectasis.  Central pulmonary vascular prominence.  Calcified mildly tortuous aorta.  IMPRESSION:  Swan-Ganz catheter has been removed.  Introducer catheter at the level of the proximal superior vena cava.  Bilateral chest tubes are in place as well as mediastinal drain. Questionable medially located left-sided pneumothorax.  Cardiomegaly.  Pleural effusion greater on the left.  Basilar atelectasis.  Central pulmonary vascular prominence.  Calcified mildly tortuous aorta.   Original Report Authenticated By: Lacy Duverney, M.D.    Cardiac Studies:  ECG: 6/14 SR rate 65 Q in lead 3    Telemetry:  NSR no afib  Echo:   Medications:   . acetaminophen  1,000 mg Oral Q6H  . aspirin EC  81 mg Oral Daily  . bisacodyl  10 mg Oral Daily   Or  . bisacodyl  10 mg Rectal Daily  . cholecalciferol  1,000 Units Oral Daily  . clopidogrel  75 mg Oral Daily  .  docusate sodium  200 mg Oral Daily  . furosemide  40 mg Oral Daily  . metoprolol tartrate  12.5 mg Oral BID  . mupirocin ointment   Nasal BID  . oxybutynin  5 mg Oral Daily  . pantoprazole  40 mg Oral Daily  . potassium chloride  20 mEq Oral Daily  . sodium chloride  3 mL Intravenous Q12H  . sodium chloride  3 mL Intravenous Q12H       Assessment/Plan:  CAD:  Previous stents S/P CABG doing well Will arrange outpatient f/u with Dr Tenny Craw Volume: continue lasix Chol:  ? Start statin  Charlton Haws 12/04/2012, 7:31 AM

## 2012-12-04 NOTE — Discharge Summary (Signed)
Physician Discharge Summary  Patient ID: KORD MONETTE MRN: [id number] DOB/AGE: 1941-03-25 72 y.o.  Admit date: 12/01/2012 Discharge date: 12/04/2012  Admission Diagnoses:  Patient Active Problem List   Diagnosis Date Noted  . DIZZINESS, CHRONIC 02/27/2009  . ABDOMINAL PAIN 02/27/2009  . ABDOMINAL PAIN, GENERALIZED 02/27/2009  . EDEMA 12/05/2008  . CHEST PAIN 11/25/2008  . NECK PAIN 11/08/2008  . Shortness of breath 11/08/2008  . HYPERLIPIDEMIA-MIXED 07/22/2008  . HYPERTENSION, BENIGN 07/22/2008  . CAD, NATIVE VESSEL 07/22/2008   Discharge Diagnoses:   Patient Active Problem List   Diagnosis Date Noted  . 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 DIZZINESS, CHRONIC 02/27/2009  . ABDOMINAL PAIN 02/27/2009  . ABDOMINAL PAIN, GENERALIZED 02/27/2009  . EDEMA 12/05/2008  . CHEST PAIN 11/25/2008  . NECK PAIN 11/08/2008  . Shortness of breath 11/08/2008  . HYPERLIPIDEMIA-MIXED 07/22/2008  . HYPERTENSION, BENIGN 07/22/2008  . CAD, NATIVE VESSEL 07/22/2008   Discharged Condition: good  History of Present Illness:   Mr. Milligan is a 72 yo white male with known history of CAD.  He underwent PCI with placement of a drug eluting stent to his LAD in 2009.  Then again in June 2010 he underwent placement of another drug eluting stent to his LAD proximal to the previous.  The patient has done well since then.  He underwent Myoview study in 2013 which was negative.  However over the past several months the patient has been experiencing exertional chest discomfort.  The patient describes the pain as a "squeezing" discomfort associated with shortness of breath, dizziness, and fatigue.  These symptoms have been slowing progressing that they have been developing at rest.  He underwent cardiac catheterization which showed severe 2 vessel CAD with a preserved EF.  It was felt Coronary Bypass Grafting would be his best treatment option.  He was referred to [facility] and evaluated by Dr. Laneta Simmers on 11/29/2012 at which time  it was felt the patient would be a candidate for surgery.  The patient has a history of vein stripping therefore saphenous vein grafts would not be utilized.  The risks and benefits of the procedure were explained to the patient and he was agreeable to proceed with surgery.   Hospital Course:   Mr. Fitzhenry presented to [facility] on December 01, 2012.  He was taken to the operating room and underwent CABG x 2 utilizing LIMA to OM and RIMA to RCA.  He tolerated the procedure well and was taken to the SICU in stable condition.  The patient was extubated the evening of surgery.  During his stay in the ICU he was weaned off all cardiac drips.  His chest tubes and arterial lines were removed without difficulty.  The patient was medically stable and transferred to the telemetry unit in stable condition.  The patient continues to do well.  He is maintaining NSR and his pacing wires have been removed.  He does complain of some dizziness upon standing.  This is a chronic problem for him.  However his weight is back to admission and he has no lower extremity edema and he will be taken of diuretics.  The patient is ambulating without difficulty and tolerating his diet.  Should no further issues arise we anticipate discharge home in the next 24-48 hours.  He will follow up with Dr. Laneta Simmers on 12/27/2012 with a CXR prior to his appointment.  He will also need to follow up with Dr. [place]    Significant Diagnostic Studies:  angiography:   Hemodynamics:  AO 181/84 with a mean of 122 mmHg  LV 181/24 mmHg  Coronary angiography:  Coronary dominance: right  Left mainstem: Normal.  Left anterior descending (LAD): The LAD demonstrates a long area of the stent involving the proximal to mid vessel. The stents are widely patent. There is diffuse disease in the mid to distal vessel up to 30%. The first diagonal is a relatively small branch which has a focal 80% ostial stenosis.  Left circumflex (LCx): The left circumflex is a  moderate size vessel which gives rise to a large first marginal vessel and then continues in the AV groove. There is a 90% ostial stenosis.  Right coronary artery (RCA): The right coronary was very difficult to engage and we're unable to access it with either a JR 4, 3 DRC, or right Amplatz catheter. With difficulty we were able to engage with a left Amplatz 1 catheter. There is a focal 70-80% stenosis in the proximal RCA. There is a 30% lesion in the mid vessel.  Left ventriculography: Left ventricular systolic function is normal, LVEF is estimated at 55-65%, there is no significant mitral regurgitation   Treatments: surgery:   1. Median Sternotomy 2. Extracorporeal circulation       3. Coronary artery bypass grafting x 2  Left internal mammary graft to the OM  Right internal mammary graft to the RCA  Disposition: 01-Home or Self Care   Future Appointments Provider Department Dept Phone   12/27/2012 1:00 PM Alleen Borne, MD [facility] [phone]     Discharge Medications:     Medication List    STOP taking these medications       isosorbide mononitrate 30 MG 24 hr tablet  Commonly known as:  IMDUR     nitroGLYCERIN 0.4 MG SL tablet  Commonly known as:  NITROSTAT      TAKE these medications       amLODipine 5 MG tablet  Commonly known as:  NORVASC  Take 1 tablet (5 mg total) by mouth daily.     aspirin EC 81 MG tablet  Take 81 mg by mouth daily.     atorvastatin 20 MG tablet  Commonly known as:  LIPITOR  Take 1 tablet (20 mg total) by mouth daily at 6 PM.     cholecalciferol 1000 UNITS tablet  Commonly known as:  VITAMIN D  Take 1,000 Units by mouth daily.     clopidogrel 75 MG tablet  Commonly known as:  PLAVIX  Take 75 mg by mouth daily.     Co Q 10 100 MG Caps  Take 2 capsules by mouth daily.     DSS 100 MG Caps  Take 200 mg by mouth 2 (two) times daily as needed for constipation.     Fish Oil 1000 MG Caps   Take 2 capsules by mouth daily.     Flax Seed Oil 1000 MG Caps  Take 1 capsule by mouth daily.     metoprolol tartrate 25 MG tablet  Commonly known as:  LOPRESSOR  Take 0.5 tablets (12.5 mg total) by mouth 2 (two) times daily.     multivitamin with minerals Tabs  Take 1 tablet by mouth daily.     omeprazole 20 MG capsule  Commonly known as:  PRILOSEC  Take 20 mg by mouth daily.     oxybutynin 5 MG 24 hr tablet  Commonly known as:  DITROPAN-XL  Take 5 mg by  mouth daily.     oxyCODONE 5 MG immediate release tablet  Commonly known as:  Oxy IR/ROXICODONE  Take 1-2 tablets (5-10 mg total) by mouth every 4 (four) hours as needed.     polyethylene glycol packet  Commonly known as:  MIRALAX / GLYCOLAX  Take 17 g by mouth daily as needed. constipation     traMADol 50 MG tablet  Commonly known as:  ULTRAM  Take 50 mg by mouth every 6 (six) hours as needed. For pain.     vitamin C 500 MG tablet  Commonly known as:  ASCORBIC ACID  Take 500 mg by mouth daily.         The patient has been discharged on:   1.Beta Blocker:  Yes [  x ]                              No   [   ]                              If No, reason:  2.Ace Inhibitor/ARB: Yes [   ]                                     No  [ x   ]                                     If No, reason: Labile blood pressure  3.Statin:   Yes [  x ]                  No  [   ]                  If No, reason:  4.Ecasa:  Yes  [ x  ]                  No   [   ]                  If No, reason:     Signed: Jaleiah Asay 12/04/2012, 12:49 PM

## 2012-12-04 NOTE — Evaluation (Signed)
Physical Therapy Evaluation Patient Details Name: [name] MRN: [id number] DOB: 06-26-1940 Today's Date: 12/04/2012 Time: 1217-1232 PT Time Calculation (min): 15 min  PT Assessment / Plan / Recommendation Clinical Impression  Patient is a 72 yo male s/p CABG.  Patient is independent with mobility and gait.  Scored 23/24 on DGI balance assessment.  Completed education on sternal precautions.  No other acute PT needs identified - PT will sign off.  Patient active with cardiac rehab, and is planning to attend Phase II program as OP here.    PT Assessment  Patent does not need any further PT services    Follow Up Recommendations  No PT follow up;Supervision/Assistance - 24 hour (Will participate in Cardiac Rehab Phase II)    Does the patient have the potential to tolerate intense rehabilitation      Barriers to Discharge        Equipment Recommendations  None recommended by PT    Recommendations for Other Services     Frequency      Precautions / Restrictions Precautions Precautions: Sternal Precaution Comments: Reviewed precautions with patient Restrictions Weight Bearing Restrictions: No   Pertinent Vitals/Pain       Mobility  Bed Mobility Bed Mobility: Supine to Sit;Sitting - Scoot to Edge of Bed Supine to Sit: 4: Min guard;With rails;HOB flat Sitting - Scoot to Edge of Bed: 5: Supervision Details for Bed Mobility Assistance: Verbal cues for technique to maintain precautions. Transfers Transfers: Sit to Stand;Stand to Sit Sit to Stand: 5: Supervision;Without upper extremity assist;From bed Stand to Sit: 5: Supervision;Without upper extremity assist;To bed Details for Transfer Assistance: Cues for technique, maintaining precautions Ambulation/Gait Ambulation/Gait Assistance: 5: Supervision Ambulation Distance (Feet): 300 Feet Assistive device: None Ambulation/Gait Assistance Details: Patient with good gait pattern and balance.  No dyspnea noted Gait Pattern:  Within Functional Limits Gait velocity: WFL Stairs: Yes Stairs Assistance: 5: Supervision Stairs Assistance Details (indicate cue type and reason): Instructed patient on step-to technique Stair Management Technique: One rail Right;Step to pattern;Forwards Number of Stairs: 5      PT Goals  N/A  Visit Information  Last PT Received On: 12/04/12 Assistance Needed: +1    Subjective Data  Subjective: "I'm going to (cardiac) rehab when I leave here" Patient Stated Goal: To go home   Prior Functioning  Home Living Lives With: Alone Available Help at Discharge: Available 24 hours/day;Other (Comment) (Significant other) Type of Home: House Home Access: Stairs to enter [employer] of Steps: 1 Entrance Stairs-Rails: None Home Layout: One level Bathroom Shower/Tub: Engineer, manufacturing systems: Standard Home Adaptive Equipment: Straight cane Prior Function Level of Independence: Independent Able to Take Stairs?: Yes Driving: Yes Vocation: Retired Research officer, political party) Musician: No difficulties    Copywriter, advertising Arousal/Alertness: Awake/alert Behavior During Therapy: WFL for tasks assessed/performed Overall Cognitive Status: Within Functional Limits for tasks assessed    Extremity/Trunk Assessment Right Upper Extremity Assessment RUE ROM/Strength/Tone: WFL for tasks assessed RUE Sensation: WFL - Light Touch Left Upper Extremity Assessment LUE ROM/Strength/Tone: WFL for tasks assessed LUE Sensation: WFL - Light Touch Right Lower Extremity Assessment RLE ROM/Strength/Tone: WFL for tasks assessed RLE Sensation: WFL - Light Touch Left Lower Extremity Assessment LLE ROM/Strength/Tone: WFL for tasks assessed LLE Sensation: WFL - Light Touch   Balance Balance Balance Assessed: Yes Standardized Balance Assessment Standardized Balance Assessment: Dynamic Gait Index Dynamic Gait Index Level Surface: Normal Change in Gait Speed: Normal Gait  with Horizontal Head Turns: Normal Gait with Vertical Head Turns: Normal  Gait and Pivot Turn: Normal Step Over Obstacle: Normal Step Around Obstacles: Normal Steps: Mild Impairment Total Score: 23  End of Session PT - End of Session Activity Tolerance: Patient tolerated treatment well Patient left: in bed;with call bell/phone within reach;with family/visitor present (sitting on EOB) Nurse Communication: Mobility status (Encouraged ambulation in hallway with nursing/family)  GP     [name] 12/04/2012, 1:07 PM Durenda Hurt. Renaldo Fiddler, [facility] Acute Rehab Services Pager [phone]

## 2012-12-05 MED ORDER — ATORVASTATIN CALCIUM 20 MG PO TABS: 20.0000 mg | ORAL_TABLET | Freq: Every day | ORAL | Status: DC

## 2012-12-05 MED ORDER — DSS 100 MG PO CAPS: 200.0000 mg | ORAL_CAPSULE | Freq: Two times a day (BID) | ORAL | Status: DC | PRN

## 2012-12-05 MED ORDER — METOPROLOL TARTRATE 25 MG PO TABS: 12.5000 mg | ORAL_TABLET | Freq: Two times a day (BID) | ORAL | Status: DC

## 2012-12-05 MED ORDER — OXYCODONE HCL 5 MG PO TABS: 5.0000 mg | ORAL_TABLET | ORAL | Status: DC | PRN

## 2012-12-05 NOTE — Progress Notes (Signed)
[id number] Cardiac Rehab Completed discharge education with pt. He voices understanding. Pt agrees to Outpt. CRP in [place], will send referral. Melina Copa RN

## 2012-12-05 NOTE — Progress Notes (Signed)
Pt discharged per MD order and protocol. Discharge instructions reviewed with patient and all questions answered. Pt given prescriptions and aware of all follow up appointments.

## 2012-12-05 NOTE — Progress Notes (Signed)
      [address]       [place] [address]             [phone]      4 Days Post-Op Procedure(s) (LRB): CORONARY ARTERY BYPASS GRAFTING (CABG) (N/A)  Subjective:  Mr. Dilone is feeling better this morning. His dizziness has improved.  He was evaluated by PT who felt he was safe to be discharged home.   +BM  Objective: Vital signs in last 24 hours: Temp:  [99.2 F (37.3 C)-100.5 F (38.1 C)] 99.4 F (37.4 C) (06/17 0300) Pulse Rate:  [74-84] 74 (06/17 0300) Cardiac Rhythm:  [-] Normal sinus rhythm (06/16 0828) Resp:  [18] 18 (06/17 0300) BP: (102-134)/(63-71) 134/67 mmHg (06/17 0300) SpO2:  [96 %-99 %] 96 % (06/17 0300) Weight:  [219 lb 9.3 oz (99.6 kg)] 219 lb 9.3 oz (99.6 kg) (06/17 0300)  Intake/Output from previous day: 06/16 0701 - 06/17 0700 In: 480 [P.O.:480] Out: 350 [Urine:350]  General appearance: alert, cooperative and no distress Neurologic: intact Heart: regular rate and rhythm Lungs: clear to auscultation bilaterally Abdomen: soft, non-tender; bowel sounds normal; no masses,  no organomegaly Extremities: extremities normal, atraumatic, no cyanosis or edema Wound: clean and dry  Lab Results:  Recent Labs  12/03/12 0440 12/04/12 0500  WBC 9.0 7.5  HGB 10.4* 10.9*  HCT 31.1* 32.7*  PLT 164 177   BMET:  Recent Labs  12/03/12 0440 12/04/12 0500  NA 136 137  K 3.9 3.7  CL 101 99  CO2 27 29  GLUCOSE 112* 136*  BUN 11 15  CREATININE 0.72 0.82  CALCIUM 8.5 8.7    PT/INR: No results found for this basename: LABPROT, INR,  in the last 72 hours ABG    Component Value Date/Time   PHART 7.286* 12/01/2012 2014   HCO3 25.7* 12/01/2012 2014   TCO2 27 12/02/2012 1610   ACIDBASEDEF 1.0 12/01/2012 2014   O2SAT 96.0 12/01/2012 2014   CBG (last 3)   Recent Labs  12/02/12 2350 12/03/12 0436 12/03/12 0728  GLUCAP 86 97 113*    Assessment/Plan: S/P Procedure(s) (LRB): CORONARY ARTERY BYPASS GRAFTING (CABG) (N/A)  1. CV- NSR  rate controlled, pressure mildly elevated will resume home Norvasc at discharge 2. Pulm- no acute issues, encouraged continue IS use at discharge 3. Renal- creatinine WNL, weight is at baseline, will continue to hold diuresis 4. Dispo- patient doing very well, will d/c home today   LOS: 4 days    Raford Pitcher, Denny Peon 12/05/2012

## 2012-12-05 NOTE — Progress Notes (Signed)
CT sutures removed per MD order and protocol. Sterri Strips applied to sites. Pt instructed to leave sterri strips in place until they fall off themselves, Pt voiced understanding. Will continue to monitor.

## 2012-12-25 ENCOUNTER — Other Ambulatory Visit: Payer: Self-pay | Admitting: *Deleted

## 2012-12-25 DIAGNOSIS — I2510 Atherosclerotic heart disease of native coronary artery without angina pectoris: Secondary | ICD-10-CM

## 2012-12-27 ENCOUNTER — Ambulatory Visit: Payer: Self-pay | Admitting: Surgery

## 2012-12-27 ENCOUNTER — Encounter (INDEPENDENT_AMBULATORY_CARE_PROVIDER_SITE_OTHER): Payer: Medicare Other

## 2012-12-27 ENCOUNTER — Ambulatory Visit: Payer: Medicare Other | Admitting: Surgery

## 2012-12-27 DIAGNOSIS — I6529 Occlusion and stenosis of unspecified carotid artery: Secondary | ICD-10-CM

## 2012-12-28 ENCOUNTER — Encounter (HOSPITAL_COMMUNITY)
Admission: RE | Admit: 2012-12-28 | Discharge: 2012-12-28 | Disposition: A | Discharge status: Home / Self Care | Payer: Medicare Other | Source: Ambulatory Visit | Attending: Internal Medicine | Admitting: Internal Medicine

## 2012-12-28 DIAGNOSIS — E785 Hyperlipidemia, unspecified: Secondary | ICD-10-CM | POA: Insufficient documentation

## 2012-12-28 DIAGNOSIS — Z5189 Encounter for other specified aftercare: Secondary | ICD-10-CM | POA: Insufficient documentation

## 2012-12-28 DIAGNOSIS — I2510 Atherosclerotic heart disease of native coronary artery without angina pectoris: Secondary | ICD-10-CM | POA: Insufficient documentation

## 2012-12-28 DIAGNOSIS — I10 Essential (primary) hypertension: Secondary | ICD-10-CM | POA: Insufficient documentation

## 2012-12-28 DIAGNOSIS — Z950 Presence of cardiac pacemaker: Secondary | ICD-10-CM | POA: Insufficient documentation

## 2012-12-28 DIAGNOSIS — I2584 Coronary atherosclerosis due to calcified coronary lesion: Secondary | ICD-10-CM | POA: Insufficient documentation

## 2012-12-28 NOTE — Progress Notes (Signed)
Cardiac Rehab Medication Review by a Pharmacist  Does the patient  feel that his/her medications are working for him/her?  yes  Has the patient been experiencing any side effects to the medications prescribed?  no  Does the patient measure his/her own blood pressure or blood glucose at home?  no   Does the patient have any problems obtaining medications due to transportation or finances?   no  Understanding of regimen: good Understanding of indications: fair Potential of compliance: good   [name] 12/28/2012 8:54 AM

## 2013-01-01 ENCOUNTER — Encounter (HOSPITAL_COMMUNITY)
Admission: RE | Admit: 2013-01-01 | Discharge: 2013-01-01 | Disposition: A | Discharge status: Home / Self Care | Payer: Medicare Other | Source: Ambulatory Visit | Attending: Internal Medicine | Admitting: Internal Medicine

## 2013-01-01 ENCOUNTER — Encounter (HOSPITAL_COMMUNITY): Payer: Medicare Other

## 2013-01-03 ENCOUNTER — Encounter (HOSPITAL_COMMUNITY): Payer: Medicare Other

## 2013-01-03 ENCOUNTER — Encounter (HOSPITAL_COMMUNITY)
Admission: RE | Admit: 2013-01-03 | Discharge: 2013-01-03 | Disposition: A | Discharge status: Home / Self Care | Payer: Medicare Other | Source: Ambulatory Visit | Attending: Internal Medicine | Admitting: Internal Medicine

## 2013-01-05 ENCOUNTER — Encounter (HOSPITAL_COMMUNITY): Payer: Medicare Other

## 2013-01-05 ENCOUNTER — Encounter (HOSPITAL_COMMUNITY)
Admission: RE | Admit: 2013-01-05 | Discharge: 2013-01-05 | Disposition: A | Discharge status: Home / Self Care | Payer: Medicare Other | Source: Ambulatory Visit | Attending: Internal Medicine | Admitting: Internal Medicine

## 2013-01-08 ENCOUNTER — Encounter (HOSPITAL_COMMUNITY)
Admission: RE | Admit: 2013-01-08 | Discharge: 2013-01-08 | Disposition: A | Discharge status: Home / Self Care | Payer: Medicare Other | Source: Ambulatory Visit | Attending: Internal Medicine | Admitting: Internal Medicine

## 2013-01-08 ENCOUNTER — Encounter (HOSPITAL_COMMUNITY): Payer: Medicare Other

## 2013-01-08 ENCOUNTER — Ambulatory Visit (INDEPENDENT_AMBULATORY_CARE_PROVIDER_SITE_OTHER): Payer: Self-pay | Admitting: Physician Assistant

## 2013-01-08 ENCOUNTER — Ambulatory Visit
Admission: RE | Admit: 2013-01-08 | Discharge: 2013-01-08 | Disposition: A | Discharge status: Home / Self Care | Payer: Medicare Other | Source: Ambulatory Visit | Attending: Surgery | Admitting: Surgery

## 2013-01-08 VITALS — BP 150/86 | HR 64 | Resp 20 | Ht 72.0 in | Wt 215.0 lb

## 2013-01-08 DIAGNOSIS — Z951 Presence of aortocoronary bypass graft: Secondary | ICD-10-CM

## 2013-01-08 DIAGNOSIS — I2510 Atherosclerotic heart disease of native coronary artery without angina pectoris: Secondary | ICD-10-CM

## 2013-01-08 MED ORDER — OXYCODONE HCL 5 MG PO TABS: 5.0000 mg | ORAL_TABLET | ORAL | Status: DC | PRN

## 2013-01-08 NOTE — Progress Notes (Signed)
[address]       [name] [address]             [phone]          HPI: Patient returns for routine postoperative follow-up having undergone CABG x 2 on 12/01/2012 by Dr. Laneta Simmers. The patient's postoperative course was generally uneventful, and he was discharged home on 6/06/27/2012 in good condition.  Since hospital discharge, the patient has progressed well.  He is still having a lot of soreness at the IMA harvest sites bilaterally and is taking Oxycodone at night to help him sleep.  He has started outpatient cardiac rehab and is doing well.  He denies shortness of breath or lower extremity edema.  He has not seen his cardiologist yet.   Current Outpatient Prescriptions  Medication Sig Dispense Refill  . amLODipine (NORVASC) 5 MG tablet Take 1 tablet (5 mg total) by mouth daily.  30 tablet  3  . aspirin EC 81 MG tablet Take 81 mg by mouth daily.      Marland Kitchen atorvastatin (LIPITOR) 20 MG tablet Take 1 tablet (20 mg total) by mouth daily at 6 PM.  30 tablet  3  . cholecalciferol (VITAMIN D) 1000 UNITS tablet Take 1,000 Units by mouth daily.      . clopidogrel (PLAVIX) 75 MG tablet Take 75 mg by mouth daily.      . Coenzyme Q10 (CO Q 10) 100 MG CAPS Take 2 capsules by mouth daily.       . Flaxseed, Linseed, (FLAX SEED OIL) 1000 MG CAPS Take 1 capsule by mouth daily.       . metoprolol tartrate (LOPRESSOR) 25 MG tablet Take 0.5 tablets (12.5 mg total) by mouth 2 (two) times daily.  30 tablet  3  . Multiple Vitamin (MULTIVITAMIN WITH MINERALS) TABS Take 1 tablet by mouth daily.      . Omega-3 Fatty Acids (FISH OIL) 1000 MG CAPS Take 2 capsules by mouth daily.      Marland Kitchen omeprazole (PRILOSEC) 20 MG capsule Take 20 mg by mouth daily.       Marland Kitchen oxybutynin (DITROPAN-XL) 5 MG 24 hr tablet Take 5 mg by mouth daily.      Marland Kitchen oxyCODONE (OXY IR/ROXICODONE) 5 MG immediate release tablet Take 1-2 tablets (5-10 mg total) by mouth every 4 (four) hours as needed.  30 tablet  0  .  polyethylene glycol (MIRALAX / GLYCOLAX) packet Take 17 g by mouth daily as needed. constipation      . traMADol (ULTRAM) 50 MG tablet Take 50 mg by mouth every 6 (six) hours as needed. For pain.      . vitamin C (ASCORBIC ACID) 500 MG tablet Take 500 mg by mouth daily.       No current facility-administered medications for this visit.     Physical Exam: BP 150/86 HR 64 Resp 20 Wounds: Sternal wound has healed well and sternum is stable Heart: regular rate and rhythm Lungs: Clear to auscultation Extremities: No lower extremity edema   Diagnostic Tests: Chest xray:  Findings: Sternotomy wires are noted. Cardiomediastinal silhouette appears normal. No acute pulmonary disease is noted. No pleural effusion or pneumothorax is noted.  IMPRESSION:  No acute cardiopulmonary abnormality seen.    Assessment/Plan: The patient is doing well status post CABG x 2.  His blood pressure is elevated in the office, but he states that it has been low every time they check it at cardiac  rehab.  He will need to arrange followup as soon as possible with Dr. [place] for cardiac management.  He is doing well from a surgical standpoint.  He may begin driving and increasing his activity as tolerated.  I have refilled his Oxycodone #30 today, and he is only taking it at night at this point.  We will see him back as needed.

## 2013-01-10 ENCOUNTER — Encounter (HOSPITAL_COMMUNITY): Payer: Medicare Other

## 2013-01-10 ENCOUNTER — Encounter (HOSPITAL_COMMUNITY)
Admission: RE | Admit: 2013-01-10 | Discharge: 2013-01-10 | Disposition: A | Discharge status: Home / Self Care | Payer: Medicare Other | Source: Ambulatory Visit | Attending: Internal Medicine | Admitting: Internal Medicine

## 2013-01-10 NOTE — Progress Notes (Signed)
Reviewed home exercise with pt today.  Pt plans to participate in [employer]ne[employer] program and continue walking outside for exercise.  Reviewed THR, pulse, RPE, sign and symptoms, NTG use, and when to call 911 or MD.  Pt voiced understanding.  Noni Saupe, Academic Intern  Cristy Hilts, MS, ACSM CES

## 2013-01-12 ENCOUNTER — Encounter (HOSPITAL_COMMUNITY): Payer: Medicare Other

## 2013-01-12 ENCOUNTER — Encounter (HOSPITAL_COMMUNITY)
Admission: RE | Admit: 2013-01-12 | Discharge: 2013-01-12 | Disposition: A | Discharge status: Home / Self Care | Payer: Medicare Other | Source: Ambulatory Visit | Attending: Internal Medicine | Admitting: Internal Medicine

## 2013-01-15 ENCOUNTER — Encounter (HOSPITAL_COMMUNITY): Payer: Medicare Other

## 2013-01-15 ENCOUNTER — Telehealth (HOSPITAL_COMMUNITY): Payer: Self-pay | Admitting: Unknown Physician Specialty

## 2013-01-17 ENCOUNTER — Encounter (HOSPITAL_COMMUNITY)
Admission: RE | Admit: 2013-01-17 | Discharge: 2013-01-17 | Disposition: A | Discharge status: Home / Self Care | Payer: Medicare Other | Source: Ambulatory Visit | Attending: Internal Medicine | Admitting: Internal Medicine

## 2013-01-17 ENCOUNTER — Encounter (HOSPITAL_COMMUNITY): Payer: Medicare Other

## 2013-01-19 ENCOUNTER — Encounter (HOSPITAL_COMMUNITY)
Admission: RE | Admit: 2013-01-19 | Discharge: 2013-01-19 | Disposition: A | Discharge status: Home / Self Care | Payer: Medicare Other | Source: Ambulatory Visit | Attending: Internal Medicine | Admitting: Internal Medicine

## 2013-01-19 ENCOUNTER — Encounter (HOSPITAL_COMMUNITY): Payer: Medicare Other

## 2013-01-19 DIAGNOSIS — Z950 Presence of cardiac pacemaker: Secondary | ICD-10-CM | POA: Insufficient documentation

## 2013-01-19 DIAGNOSIS — E785 Hyperlipidemia, unspecified: Secondary | ICD-10-CM | POA: Insufficient documentation

## 2013-01-19 DIAGNOSIS — I2510 Atherosclerotic heart disease of native coronary artery without angina pectoris: Secondary | ICD-10-CM | POA: Insufficient documentation

## 2013-01-19 DIAGNOSIS — I10 Essential (primary) hypertension: Secondary | ICD-10-CM | POA: Insufficient documentation

## 2013-01-19 DIAGNOSIS — Z5189 Encounter for other specified aftercare: Secondary | ICD-10-CM | POA: Insufficient documentation

## 2013-01-19 DIAGNOSIS — I2584 Coronary atherosclerosis due to calcified coronary lesion: Secondary | ICD-10-CM | POA: Insufficient documentation

## 2013-01-22 ENCOUNTER — Encounter (HOSPITAL_COMMUNITY): Payer: Medicare Other

## 2013-01-24 ENCOUNTER — Encounter (HOSPITAL_COMMUNITY): Payer: Medicare Other

## 2013-01-26 ENCOUNTER — Encounter (HOSPITAL_COMMUNITY): Payer: Medicare Other

## 2013-01-29 ENCOUNTER — Encounter (HOSPITAL_COMMUNITY): Payer: Medicare Other

## 2013-01-31 ENCOUNTER — Encounter (HOSPITAL_COMMUNITY): Payer: Medicare Other

## 2013-02-02 ENCOUNTER — Encounter (HOSPITAL_COMMUNITY): Payer: Medicare Other

## 2013-02-05 ENCOUNTER — Encounter (HOSPITAL_COMMUNITY): Payer: Medicare Other

## 2013-02-07 ENCOUNTER — Encounter (HOSPITAL_COMMUNITY): Payer: Medicare Other

## 2013-02-07 ENCOUNTER — Other Ambulatory Visit: Payer: Self-pay | Admitting: *Deleted

## 2013-02-07 ENCOUNTER — Encounter (HOSPITAL_COMMUNITY)
Admission: RE | Admit: 2013-02-07 | Discharge: 2013-02-07 | Disposition: A | Discharge status: Home / Self Care | Payer: Medicare Other | Source: Ambulatory Visit | Attending: Internal Medicine | Admitting: Internal Medicine

## 2013-02-07 NOTE — Progress Notes (Signed)
Pt returned to cardiac rehab today after 2 week vacation.  Pt weight up 3.6 kg.  Pt denies dyspnea or edema.  Pt lungs clear, O2 sat-100%.  Pt states he had increased caloric intake this past week. Will continue to monitor

## 2013-02-09 ENCOUNTER — Encounter (HOSPITAL_COMMUNITY): Payer: Medicare Other

## 2013-02-09 ENCOUNTER — Encounter (HOSPITAL_COMMUNITY)
Admission: RE | Admit: 2013-02-09 | Discharge: 2013-02-09 | Disposition: A | Discharge status: Home / Self Care | Payer: Medicare Other | Source: Ambulatory Visit | Attending: Internal Medicine | Admitting: Internal Medicine

## 2013-02-12 ENCOUNTER — Encounter (HOSPITAL_COMMUNITY): Payer: Medicare Other

## 2013-02-12 ENCOUNTER — Encounter (HOSPITAL_COMMUNITY)
Admission: RE | Admit: 2013-02-12 | Discharge: 2013-02-12 | Disposition: A | Discharge status: Home / Self Care | Payer: Medicare Other | Source: Ambulatory Visit | Attending: Internal Medicine | Admitting: Internal Medicine

## 2013-02-14 ENCOUNTER — Encounter (HOSPITAL_COMMUNITY): Payer: Medicare Other

## 2013-02-14 ENCOUNTER — Encounter (HOSPITAL_COMMUNITY)
Admission: RE | Admit: 2013-02-14 | Discharge: 2013-02-14 | Disposition: A | Discharge status: Home / Self Care | Payer: Medicare Other | Source: Ambulatory Visit | Attending: Internal Medicine | Admitting: Internal Medicine

## 2013-02-16 ENCOUNTER — Encounter (HOSPITAL_COMMUNITY)
Admission: RE | Admit: 2013-02-16 | Discharge: 2013-02-16 | Disposition: A | Discharge status: Home / Self Care | Payer: Medicare Other | Source: Ambulatory Visit | Attending: Internal Medicine | Admitting: Internal Medicine

## 2013-02-16 ENCOUNTER — Encounter (HOSPITAL_COMMUNITY): Payer: Medicare Other

## 2013-02-19 ENCOUNTER — Encounter (HOSPITAL_COMMUNITY): Payer: Medicare Other

## 2013-02-19 DIAGNOSIS — E785 Hyperlipidemia, unspecified: Secondary | ICD-10-CM | POA: Insufficient documentation

## 2013-02-19 DIAGNOSIS — I10 Essential (primary) hypertension: Secondary | ICD-10-CM | POA: Insufficient documentation

## 2013-02-19 DIAGNOSIS — Z5189 Encounter for other specified aftercare: Secondary | ICD-10-CM | POA: Insufficient documentation

## 2013-02-19 DIAGNOSIS — Z950 Presence of cardiac pacemaker: Secondary | ICD-10-CM | POA: Insufficient documentation

## 2013-02-19 DIAGNOSIS — I2510 Atherosclerotic heart disease of native coronary artery without angina pectoris: Secondary | ICD-10-CM | POA: Insufficient documentation

## 2013-02-19 DIAGNOSIS — I2584 Coronary atherosclerosis due to calcified coronary lesion: Secondary | ICD-10-CM | POA: Insufficient documentation

## 2013-02-21 ENCOUNTER — Encounter (HOSPITAL_COMMUNITY)
Admission: RE | Admit: 2013-02-21 | Discharge: 2013-02-21 | Disposition: A | Discharge status: Home / Self Care | Payer: Medicare Other | Source: Ambulatory Visit | Attending: Internal Medicine | Admitting: Internal Medicine

## 2013-02-21 ENCOUNTER — Encounter (HOSPITAL_COMMUNITY): Payer: Medicare Other

## 2013-02-23 ENCOUNTER — Encounter (HOSPITAL_COMMUNITY): Payer: Medicare Other

## 2013-02-23 ENCOUNTER — Encounter (HOSPITAL_COMMUNITY): Admission: RE | Admit: 2013-02-23 | Payer: Medicare Other | Source: Ambulatory Visit

## 2013-02-26 ENCOUNTER — Encounter (HOSPITAL_COMMUNITY)
Admission: RE | Admit: 2013-02-26 | Discharge: 2013-02-26 | Disposition: A | Discharge status: Home / Self Care | Payer: Medicare Other | Source: Ambulatory Visit | Attending: Internal Medicine | Admitting: Internal Medicine

## 2013-02-26 ENCOUNTER — Encounter (HOSPITAL_COMMUNITY): Payer: Medicare Other

## 2013-02-28 ENCOUNTER — Encounter (HOSPITAL_COMMUNITY): Payer: Medicare Other

## 2013-02-28 ENCOUNTER — Encounter (HOSPITAL_COMMUNITY)
Admission: RE | Admit: 2013-02-28 | Discharge: 2013-02-28 | Disposition: A | Discharge status: Home / Self Care | Payer: Medicare Other | Source: Ambulatory Visit | Attending: Internal Medicine | Admitting: Internal Medicine

## 2013-02-28 ENCOUNTER — Ambulatory Visit: Payer: Medicare Other | Admitting: Nurse Practitioner

## 2013-03-02 ENCOUNTER — Encounter (HOSPITAL_COMMUNITY): Payer: Medicare Other

## 2013-03-05 ENCOUNTER — Encounter (HOSPITAL_COMMUNITY): Payer: Medicare Other

## 2013-03-05 ENCOUNTER — Encounter (HOSPITAL_COMMUNITY)
Admission: RE | Admit: 2013-03-05 | Discharge: 2013-03-05 | Disposition: A | Discharge status: Home / Self Care | Payer: Medicare Other | Source: Ambulatory Visit | Attending: Internal Medicine | Admitting: Internal Medicine

## 2013-03-05 NOTE — Progress Notes (Signed)
Pt arrived at cardiac rehab this morning reporting he was hospitalized on Friday for dehydration r/t vomiting from food poisoning.  Pt reports he was treated with IV fluids at [place] in [place].  Pt states he has been asymptomatic since Friday evening. Pt tolerated light exercise today without difficulty. Pt reminded to drink plenty of water with exercise today.  Understanding verbalized

## 2013-03-07 ENCOUNTER — Encounter (HOSPITAL_COMMUNITY): Payer: Medicare Other

## 2013-03-07 ENCOUNTER — Encounter (HOSPITAL_COMMUNITY)
Admission: RE | Admit: 2013-03-07 | Discharge: 2013-03-07 | Disposition: A | Discharge status: Home / Self Care | Payer: Medicare Other | Source: Ambulatory Visit | Attending: Internal Medicine | Admitting: Internal Medicine

## 2013-03-09 ENCOUNTER — Encounter (HOSPITAL_COMMUNITY)
Admission: RE | Admit: 2013-03-09 | Discharge: 2013-03-09 | Disposition: A | Discharge status: Home / Self Care | Payer: Medicare Other | Source: Ambulatory Visit | Attending: Internal Medicine | Admitting: Internal Medicine

## 2013-03-09 ENCOUNTER — Encounter (HOSPITAL_COMMUNITY): Payer: Medicare Other

## 2013-03-12 ENCOUNTER — Encounter (HOSPITAL_COMMUNITY)
Admission: RE | Admit: 2013-03-12 | Discharge: 2013-03-12 | Disposition: A | Discharge status: Home / Self Care | Payer: Medicare Other | Source: Ambulatory Visit | Attending: Internal Medicine | Admitting: Internal Medicine

## 2013-03-12 ENCOUNTER — Encounter (HOSPITAL_COMMUNITY): Payer: Medicare Other

## 2013-03-14 ENCOUNTER — Encounter (HOSPITAL_COMMUNITY): Payer: Medicare Other

## 2013-03-14 ENCOUNTER — Encounter (HOSPITAL_COMMUNITY)
Admission: RE | Admit: 2013-03-14 | Discharge: 2013-03-14 | Disposition: A | Discharge status: Home / Self Care | Payer: Medicare Other | Source: Ambulatory Visit | Attending: Internal Medicine | Admitting: Internal Medicine

## 2013-03-16 ENCOUNTER — Encounter (HOSPITAL_COMMUNITY): Payer: Medicare Other

## 2013-03-16 ENCOUNTER — Encounter (HOSPITAL_COMMUNITY)
Admission: RE | Admit: 2013-03-16 | Discharge: 2013-03-16 | Disposition: A | Discharge status: Home / Self Care | Payer: Medicare Other | Source: Ambulatory Visit | Attending: Internal Medicine | Admitting: Internal Medicine

## 2013-03-19 ENCOUNTER — Encounter (HOSPITAL_COMMUNITY)
Admission: RE | Admit: 2013-03-19 | Discharge: 2013-03-19 | Disposition: A | Discharge status: Home / Self Care | Payer: Medicare Other | Source: Ambulatory Visit | Attending: Internal Medicine | Admitting: Internal Medicine

## 2013-03-19 ENCOUNTER — Encounter (HOSPITAL_COMMUNITY): Payer: Medicare Other

## 2013-03-21 ENCOUNTER — Encounter (HOSPITAL_COMMUNITY): Payer: Medicare Other

## 2013-03-23 ENCOUNTER — Encounter (HOSPITAL_COMMUNITY): Payer: Medicare Other

## 2013-03-26 ENCOUNTER — Encounter (HOSPITAL_COMMUNITY): Payer: Medicare Other

## 2013-03-28 ENCOUNTER — Encounter (HOSPITAL_COMMUNITY)
Admission: RE | Admit: 2013-03-28 | Discharge: 2013-03-28 | Disposition: A | Discharge status: Home / Self Care | Payer: Medicare Other | Source: Ambulatory Visit | Attending: Internal Medicine | Admitting: Internal Medicine

## 2013-03-28 ENCOUNTER — Encounter (HOSPITAL_COMMUNITY): Payer: Medicare Other

## 2013-03-28 DIAGNOSIS — E785 Hyperlipidemia, unspecified: Secondary | ICD-10-CM | POA: Insufficient documentation

## 2013-03-28 DIAGNOSIS — I2584 Coronary atherosclerosis due to calcified coronary lesion: Secondary | ICD-10-CM | POA: Insufficient documentation

## 2013-03-28 DIAGNOSIS — Z950 Presence of cardiac pacemaker: Secondary | ICD-10-CM | POA: Insufficient documentation

## 2013-03-28 DIAGNOSIS — I2510 Atherosclerotic heart disease of native coronary artery without angina pectoris: Secondary | ICD-10-CM | POA: Insufficient documentation

## 2013-03-28 DIAGNOSIS — I10 Essential (primary) hypertension: Secondary | ICD-10-CM | POA: Insufficient documentation

## 2013-03-28 DIAGNOSIS — Z5189 Encounter for other specified aftercare: Secondary | ICD-10-CM | POA: Insufficient documentation

## 2013-03-30 ENCOUNTER — Encounter (HOSPITAL_COMMUNITY)
Admission: RE | Admit: 2013-03-30 | Discharge: 2013-03-30 | Disposition: A | Discharge status: Home / Self Care | Payer: Medicare Other | Source: Ambulatory Visit | Attending: Internal Medicine | Admitting: Internal Medicine

## 2013-03-30 ENCOUNTER — Encounter (HOSPITAL_COMMUNITY): Payer: Medicare Other

## 2013-03-30 NOTE — Progress Notes (Signed)
Pt arrived at cardiac rehab today reporting he frequently has itching in his lower extremities.  Denies edema. Pt is concerned this could be r/t circulation.  On exam, no redness or swelling present.  Trace bilateral ankle edema present.  Pt is treating with eucerin cream which is improving sx.  Pt also states he is going on international mission trip next month.  After reviewing pt records, it appears pt has not had cardiology office f/u since his surgery.  Pt scheduled to see Dr. Tenny Craw 04/16/2013 @ 2:15.  Written and verbal instructions given. Pt states he was recently evaluated by his pcp.   Pt advised if sx worsen will seek office appointment sooner.  Understanding verbalized

## 2013-03-31 ENCOUNTER — Other Ambulatory Visit: Payer: Self-pay | Admitting: Cardiology

## 2013-03-31 ENCOUNTER — Other Ambulatory Visit (HOSPITAL_COMMUNITY): Payer: Self-pay | Admitting: Physician Assistant

## 2013-04-02 ENCOUNTER — Encounter (HOSPITAL_COMMUNITY): Payer: Medicare Other

## 2013-04-04 ENCOUNTER — Encounter (HOSPITAL_COMMUNITY): Payer: Medicare Other

## 2013-04-04 ENCOUNTER — Telehealth (HOSPITAL_COMMUNITY): Payer: Self-pay | Admitting: Unknown Physician Specialty

## 2013-04-06 ENCOUNTER — Encounter (HOSPITAL_COMMUNITY)
Admission: RE | Admit: 2013-04-06 | Discharge: 2013-04-06 | Disposition: A | Discharge status: Home / Self Care | Payer: Medicare Other | Source: Ambulatory Visit | Attending: Internal Medicine | Admitting: Internal Medicine

## 2013-04-06 ENCOUNTER — Encounter (HOSPITAL_COMMUNITY): Payer: Medicare Other

## 2013-04-09 ENCOUNTER — Encounter (HOSPITAL_COMMUNITY)
Admission: RE | Admit: 2013-04-09 | Discharge: 2013-04-09 | Disposition: A | Discharge status: Home / Self Care | Payer: Medicare Other | Source: Ambulatory Visit | Attending: Internal Medicine | Admitting: Internal Medicine

## 2013-04-09 ENCOUNTER — Other Ambulatory Visit: Payer: Self-pay | Admitting: *Deleted

## 2013-04-09 MED ORDER — METOPROLOL TARTRATE 25 MG PO TABS: 12.5000 mg | ORAL_TABLET | Freq: Two times a day (BID) | ORAL | Status: DC

## 2013-04-09 MED ORDER — ATORVASTATIN CALCIUM 20 MG PO TABS: 20.0000 mg | ORAL_TABLET | Freq: Every day | ORAL | Status: DC

## 2013-04-09 NOTE — Progress Notes (Signed)
[name] 72 y.o. male Nutrition Note Spoke with pt. Pt well-known to this Clinical research associate from previous admission. Nutrition Plan and Nutrition Survey goals reviewed with pt. Pt is not following the Therapeutic Lifestyle Changes diet. Pt has made "some changes" since starting Cardiac Rehab. Pt wants to lose wt. Pt has not been actively trying to lose wt. Pt has previously done Weight Watcher's "and lost 50 lbs in 2 months." Per pt, his wt is "back up to 222 lbs, which I like to blame on constipation." Pt c/o having a bowel movement every 3-4 days. Ways to manage constipation with diet discussed and wt loss tips reviewed. Pt is pre-diabetic according to his last A1c. Pre-diabetes discussed. Pt encouraged to discuss pre-diabetes further with his PCP. Pt expressed understanding of the information reviewed. Pt aware of nutrition education classes offered and reports attending nutrition classes during previous admission.   Nutrition Diagnosis   Food-and nutrition-related knowledge deficit related to lack of exposure to information as related to diagnosis of: ? CVD ? Pre-DM (A1c 6.0) ?   Overweight related to excessive energy intake as evidenced by a BMI of 29.3  Nutrition RX/ Estimated Daily Nutrition Needs for: wt loss  1650-2150 Kcal, 45-60 gm fat, 11-14 gm sat fat, 1.6-2.2 gm trans-fat, <1500 mg sodium   Nutrition Intervention   Pt's individual nutrition plan including cholesterol goals reviewed with pt.   Benefits of adopting Therapeutic Lifestyle Changes discussed when Medficts reviewed.   Pt to attend the Portion Distortion class   Pt given handouts for: ? Nutrition I class ? Nutrition II class   Continue client-centered nutrition education by RD, as part of interdisciplinary care.  Goal(s)   Pt to identify and limit food sources of saturated fat, trans fat, and cholesterol   Pt to identify food quantities necessary to achieve: ? wt loss to a goal wt of 191-209 lb (87.0-95.2 kg) at graduation  from cardiac rehab.   Monitor and Evaluate progress toward nutrition goal with team. Nutrition Risk:  Low   Mickle Plumb, M.Ed, RD, LDN, CDE 04/09/2013 9:33 AM

## 2013-04-09 NOTE — Telephone Encounter (Signed)
Dr Tenny Craw called pt/ pt needs refills//completed.

## 2013-04-11 ENCOUNTER — Encounter (HOSPITAL_COMMUNITY)
Admission: RE | Admit: 2013-04-11 | Discharge: 2013-04-11 | Disposition: A | Discharge status: Home / Self Care | Payer: Medicare Other | Source: Ambulatory Visit | Attending: Internal Medicine | Admitting: Internal Medicine

## 2013-04-13 ENCOUNTER — Encounter (HOSPITAL_COMMUNITY): Payer: Medicare Other

## 2013-04-16 ENCOUNTER — Ambulatory Visit (INDEPENDENT_AMBULATORY_CARE_PROVIDER_SITE_OTHER): Payer: Medicare Other | Admitting: Internal Medicine

## 2013-04-16 ENCOUNTER — Encounter (HOSPITAL_COMMUNITY)
Admission: RE | Admit: 2013-04-16 | Discharge: 2013-04-16 | Disposition: A | Discharge status: Home / Self Care | Payer: Medicare Other | Source: Ambulatory Visit | Attending: Internal Medicine | Admitting: Internal Medicine

## 2013-04-16 ENCOUNTER — Encounter: Payer: Self-pay | Admitting: Internal Medicine

## 2013-04-16 VITALS — BP 114/70 | HR 61 | Wt 224.0 lb

## 2013-04-16 DIAGNOSIS — Z951 Presence of aortocoronary bypass graft: Secondary | ICD-10-CM

## 2013-04-16 LAB — LIPID PANEL
HDL: 49.9 mg/dL (ref >39.00)
LDL Cholesterol: 72 mg/dL (ref 0–99)
Total CHOL/HDL Ratio: 3
Triglycerides: 109 mg/dL (ref 0.0–149.0)

## 2013-04-16 LAB — CBC WITH DIFFERENTIAL/PLATELET
Basophils Relative: 0.6 % (ref 0.0–3.0)
Eosinophils Relative: 5.4 % — ABNORMAL HIGH (ref 0.0–5.0)
Hemoglobin: 11.2 g/dL — ABNORMAL LOW (ref 13.0–17.0)
Lymphocytes Relative: 29.6 % (ref 12.0–46.0)
MCV: 87.9 fl (ref 78.0–100.0)
Neutrophils Relative %: 47.3 % (ref 43.0–77.0)
RBC: 3.84 Mil/uL — ABNORMAL LOW (ref 4.22–5.81)
WBC: 5.4 10*3/uL (ref 4.5–10.5)

## 2013-04-16 NOTE — Patient Instructions (Signed)
Your physician wants you to follow-up in: 6 MONTHS WITH DR ROSS You will receive a reminder letter in the mail two months in advance. If you don't receive a letter, please call our office to schedule the follow-up appointment.   Your physician recommends that you HAVE LAB WORK TODAY 

## 2013-04-16 NOTE — Progress Notes (Signed)
Pt has appt with Dr. Tenny Craw today.   Rehab report given to patient to take with him to appt.

## 2013-04-18 ENCOUNTER — Encounter (HOSPITAL_COMMUNITY)
Admission: RE | Admit: 2013-04-18 | Discharge: 2013-04-18 | Disposition: A | Discharge status: Home / Self Care | Payer: Medicare Other | Source: Ambulatory Visit | Attending: Internal Medicine | Admitting: Internal Medicine

## 2013-04-18 ENCOUNTER — Telehealth: Payer: Self-pay | Admitting: *Deleted

## 2013-04-18 DIAGNOSIS — D649 Anemia, unspecified: Secondary | ICD-10-CM

## 2013-04-18 NOTE — Progress Notes (Signed)
HPI [name] is a 72 yo white male with known history of CAD. He underwent PCI with placement of a drug eluting stent to his LAD in 2009. Then again in June 2010 he underwent placement of another drug eluting stent to his LAD proximal to the previous. The patient has done well since then. He underwent Myoview study in 2013 which was negative.  The patient was admitted earlier this summer for squeezing sensation of chest.  Had cardiac cathe that showed severe 2V CAD. He  underwent CABG x 2 utilizing LIMA to OM and RIMA to RCA. He was discharged and has finished cardiac rehab. Since d/c he has not had any recurrent squeezing in chest  Energy is better than right before CABG but still not at its best. Remains active Concerned he may have sleep apnea  Girlfriend has observed him.   No Known Allergies  Current Outpatient Prescriptions  Medication Sig Dispense Refill  . amLODipine (NORVASC) 5 MG tablet TAKE 1 TABLET BY MOUTH EVERY DAY  30 tablet  6  . aspirin EC 81 MG tablet Take 81 mg by mouth daily.      Marland Kitchen atorvastatin (LIPITOR) 20 MG tablet Take 1 tablet (20 mg total) by mouth daily at 6 PM.  30 tablet  3  . cholecalciferol (VITAMIN D) 1000 UNITS tablet Take 1,000 Units by mouth daily.      . clopidogrel (PLAVIX) 75 MG tablet Take 75 mg by mouth daily.      . Coenzyme Q10 (CO Q 10) 100 MG CAPS Take 2 capsules by mouth daily.       . Flaxseed, Linseed, (FLAX SEED OIL) 1000 MG CAPS Take 1 capsule by mouth daily.       . metoprolol tartrate (LOPRESSOR) 25 MG tablet Take 0.5 tablets (12.5 mg total) by mouth 2 (two) times daily.  30 tablet  3  . metoprolol tartrate (LOPRESSOR) 25 MG tablet Take 0.5 tablets (12.5 mg total) by mouth 2 (two) times daily.  30 tablet  3  . Multiple Vitamin (MULTIVITAMIN WITH MINERALS) TABS Take 1 tablet by mouth daily.      . Omega-3 Fatty Acids (FISH OIL) 1000 MG CAPS Take 2 capsules by mouth daily.      Marland Kitchen omeprazole (PRILOSEC) 20 MG capsule Take 20 mg by mouth daily.        Marland Kitchen oxybutynin (DITROPAN-XL) 5 MG 24 hr tablet Take 5 mg by mouth daily.      . polyethylene glycol (MIRALAX / GLYCOLAX) packet Take 17 g by mouth daily as needed. constipation      . vitamin C (ASCORBIC ACID) 500 MG tablet Take 500 mg by mouth daily.       No current facility-administered medications for this visit.    Past Medical History  Diagnosis Date  . HYPERLIPIDEMIA-MIXED 07/22/2008  . CAD, NATIVE VESSEL 07/22/2008     a.  s/p DES to LAD in 2009; s/p repeat DES to LAD 11/2008 (prox to previous stent).  . Edema 12/05/2008  . DIZZINESS, CHRONIC 02/27/2009  . Testicular cancer   . DJD (degenerative joint disease)   . Lung nodule     RLL lung nodule (PET CT 1/10 negative);R parotid hypermetabolic on PET 06/2008  . HYPERTENSION, BENIGN 07/22/2008  . Anginal pain     pressure- last time 11/23/12 saw Dr Tenny Craw  . Shortness of breath     with exertion  . Stroke 1998ish    TIA  , Left hand stinging ,  hurts to flex  . History of kidney stones   . GERD (gastroesophageal reflux disease)   . Constipation     Past Surgical History  Procedure Laterality Date  . Appendectomy    . Tonsillectomy    . Rotator cuff repair      right  . Total hip arthroplasty Left   . Bilateral orchiectomy    . Veins stripped    . Coronary artery bypass graft N/A 12/01/2012    Procedure: CORONARY ARTERY BYPASS GRAFTING (CABG);  Surgeon: Alleen Borne, MD;  Location: [facility] OR;  Service: Open Heart Surgery;  Laterality: N/A;    Family History  Problem Relation Age of Onset  . Heart disease Mother   . Hypertension Mother   . Diabetes Mother   . COPD Father   . Aneurysm Brother   . Kidney disease Brother     History   Social History  . Marital Status: Widowed    Spouse Name: N/A    Number of Children: N/A  . Years of Education: N/A   Occupational History  . Not on file.   Social History Main Topics  . Smoking status: Never Smoker   . Smokeless tobacco: Never Used  . Alcohol Use: Yes     Comment:  rare  . Drug Use: No  . Sexual Activity: Not Currently   Other Topics Concern  . Not on file   Social History Narrative  . No narrative on file    Review of Systems:  All systems reviewed.  They are negative to the above problem except as previously stated.  Vital Signs: BP 114/70  Pulse 61  Wt 224 lb (101.606 kg)  BMI 30.37 kg/m2  SpO2 96%  Physical Exam  HEENT:  Normocephalic, atraumatic. EOMI, PERRLA.  Neck: JVP is normal.  No bruits.  Lungs: clear to auscultation. No rales no wheezes.  Heart: Regular rate and rhythm. Normal S1, S2. No S3.   No significant murmurs. PMI not displaced.  Abdomen:  Supple, nontender. Normal bowel sounds. No masses. No hepatomegaly.  Extremities:   Good distal pulses throughout. No lower extremity edema.  Musculoskeletal :moving all extremities.  Neuro:   alert and oriented x3.  CN II-XII grossly intact.  SR 61 bpm  First degree AV block  212 msec.   Assessment and Plan:    1.  CAD  Doing well post CABG  Check labs  Would keep on Plavix given LAD stents.    2.  HL  Check lipids.  3.  ? Sleep apnea  Will see about sleep study  Set f/u for spring   Patient to go on mission trip to [place]  Should do OK.

## 2013-04-18 NOTE — Telephone Encounter (Signed)
lmptcb go over lab results 

## 2013-04-20 ENCOUNTER — Encounter (HOSPITAL_COMMUNITY): Payer: Medicare Other

## 2013-04-20 NOTE — Telephone Encounter (Signed)
lmom labs good , very mild anemia however improved since June, repeat cbc end of march 2015. Continue same regimen including plavix per Dr. Tenny Craw

## 2013-04-23 ENCOUNTER — Encounter (HOSPITAL_COMMUNITY): Payer: Medicare Other

## 2013-04-25 ENCOUNTER — Telehealth: Payer: Self-pay | Admitting: *Deleted

## 2013-04-25 ENCOUNTER — Encounter (HOSPITAL_COMMUNITY): Payer: Medicare Other

## 2013-04-25 NOTE — Telephone Encounter (Signed)
Per dr Nino Parsley be set up for home sleep study when returns from [place]. Patient always conceerned on cost Check coverage.  Left message for pt to call

## 2013-04-27 ENCOUNTER — Encounter (HOSPITAL_COMMUNITY): Payer: Medicare Other

## 2013-04-30 ENCOUNTER — Encounter (HOSPITAL_COMMUNITY): Payer: Medicare Other

## 2013-05-02 ENCOUNTER — Encounter (HOSPITAL_COMMUNITY): Payer: Medicare Other

## 2013-05-04 ENCOUNTER — Encounter (HOSPITAL_COMMUNITY): Payer: Medicare Other

## 2013-05-22 ENCOUNTER — Encounter (HOSPITAL_COMMUNITY): Payer: Self-pay

## 2013-05-23 ENCOUNTER — Encounter (HOSPITAL_COMMUNITY): Payer: Self-pay

## 2013-05-24 ENCOUNTER — Encounter (HOSPITAL_COMMUNITY): Payer: Self-pay

## 2013-05-25 NOTE — Telephone Encounter (Signed)
Spoke with pt, [place] and would like to have the testing done. Fax sent to [facility]

## 2013-05-29 ENCOUNTER — Encounter (HOSPITAL_COMMUNITY): Payer: Self-pay

## 2013-05-30 ENCOUNTER — Encounter (HOSPITAL_COMMUNITY): Payer: Self-pay

## 2013-05-31 ENCOUNTER — Encounter (HOSPITAL_COMMUNITY): Payer: Self-pay

## 2013-06-05 ENCOUNTER — Encounter (HOSPITAL_COMMUNITY): Payer: Self-pay

## 2013-06-06 ENCOUNTER — Encounter (HOSPITAL_COMMUNITY): Payer: Self-pay

## 2013-06-07 ENCOUNTER — Encounter (HOSPITAL_COMMUNITY): Payer: Self-pay

## 2013-06-12 ENCOUNTER — Encounter (HOSPITAL_COMMUNITY): Payer: Self-pay

## 2013-06-13 ENCOUNTER — Encounter (HOSPITAL_COMMUNITY): Payer: Self-pay

## 2013-06-19 ENCOUNTER — Encounter (HOSPITAL_COMMUNITY): Payer: Self-pay

## 2013-06-20 ENCOUNTER — Encounter (HOSPITAL_COMMUNITY): Payer: Self-pay

## 2013-06-26 ENCOUNTER — Encounter (HOSPITAL_COMMUNITY): Payer: Self-pay | Attending: Unknown Physician Specialty

## 2013-06-27 ENCOUNTER — Encounter (HOSPITAL_COMMUNITY): Payer: Self-pay

## 2013-06-28 ENCOUNTER — Encounter (HOSPITAL_COMMUNITY): Payer: Self-pay

## 2013-07-03 ENCOUNTER — Encounter (HOSPITAL_COMMUNITY): Payer: Self-pay

## 2013-07-04 ENCOUNTER — Encounter (HOSPITAL_COMMUNITY): Payer: Self-pay

## 2013-07-05 ENCOUNTER — Encounter (HOSPITAL_COMMUNITY): Payer: Self-pay

## 2013-07-10 ENCOUNTER — Encounter (HOSPITAL_COMMUNITY): Payer: Self-pay

## 2013-07-11 ENCOUNTER — Encounter (HOSPITAL_COMMUNITY): Payer: Self-pay

## 2013-07-12 ENCOUNTER — Encounter (HOSPITAL_COMMUNITY): Payer: Self-pay

## 2013-07-17 ENCOUNTER — Encounter (HOSPITAL_COMMUNITY): Payer: Self-pay

## 2013-07-18 ENCOUNTER — Encounter (HOSPITAL_COMMUNITY): Payer: Self-pay

## 2013-07-19 ENCOUNTER — Encounter (HOSPITAL_COMMUNITY): Payer: Self-pay

## 2013-07-24 ENCOUNTER — Encounter (HOSPITAL_COMMUNITY): Payer: Self-pay

## 2013-07-25 ENCOUNTER — Encounter (HOSPITAL_COMMUNITY): Payer: Self-pay

## 2013-07-26 ENCOUNTER — Encounter (HOSPITAL_COMMUNITY): Payer: Self-pay

## 2013-07-31 ENCOUNTER — Other Ambulatory Visit: Payer: Self-pay | Admitting: *Deleted

## 2013-07-31 ENCOUNTER — Encounter (HOSPITAL_COMMUNITY): Payer: Self-pay

## 2013-07-31 DIAGNOSIS — R0683 Snoring: Secondary | ICD-10-CM

## 2013-08-01 ENCOUNTER — Encounter (HOSPITAL_COMMUNITY): Payer: Self-pay

## 2013-08-02 ENCOUNTER — Encounter (HOSPITAL_COMMUNITY): Payer: Self-pay

## 2013-08-07 ENCOUNTER — Encounter (HOSPITAL_COMMUNITY): Payer: Self-pay

## 2013-08-08 ENCOUNTER — Other Ambulatory Visit: Payer: Self-pay | Admitting: Internal Medicine

## 2013-08-08 ENCOUNTER — Other Ambulatory Visit: Payer: Self-pay | Admitting: Cardiology

## 2013-08-08 ENCOUNTER — Encounter (HOSPITAL_COMMUNITY): Payer: Self-pay

## 2013-08-09 ENCOUNTER — Encounter (HOSPITAL_COMMUNITY): Payer: Self-pay

## 2013-08-09 ENCOUNTER — Telehealth: Payer: Self-pay | Admitting: Internal Medicine

## 2013-08-09 MED ORDER — METOPROLOL TARTRATE 25 MG PO TABS: 12.5000 mg | ORAL_TABLET | Freq: Two times a day (BID) | ORAL | Status: DC

## 2013-08-09 MED ORDER — ISOSORBIDE MONONITRATE ER 30 MG PO TB24: 30.0000 mg | ORAL_TABLET | Freq: Every day | ORAL | Status: DC

## 2013-08-09 MED ORDER — ATORVASTATIN CALCIUM 20 MG PO TABS: 20.0000 mg | ORAL_TABLET | Freq: Every day | ORAL | Status: DC

## 2013-08-09 NOTE — Telephone Encounter (Signed)
Spoke with pt, aware scripts sent to the pharm.

## 2013-08-09 NOTE — Telephone Encounter (Signed)
New problem   Pt need to speak to nurse concerning 3 meds that he doesn't have refills for. He need to know if he need to still take them. Please advise pt. 1. Isosorbide Mononigrage 30mg  2.metropolol tartrate 25mg  3. Atorvastatrin 20mg .

## 2013-08-14 ENCOUNTER — Encounter (HOSPITAL_COMMUNITY): Payer: Self-pay

## 2013-08-15 ENCOUNTER — Encounter (HOSPITAL_COMMUNITY): Payer: Self-pay

## 2013-08-16 ENCOUNTER — Encounter (HOSPITAL_COMMUNITY): Payer: Self-pay

## 2013-08-18 ENCOUNTER — Emergency Department (INDEPENDENT_AMBULATORY_CARE_PROVIDER_SITE_OTHER)
Admission: EM | Admit: 2013-08-18 | Discharge: 2013-08-18 | Disposition: A | Discharge status: Home / Self Care | Payer: Medicare HMO | Source: Home / Self Care | Attending: Emergency Medicine | Admitting: Emergency Medicine

## 2013-08-18 ENCOUNTER — Encounter (HOSPITAL_COMMUNITY): Payer: Self-pay | Admitting: Emergency Medicine

## 2013-08-18 DIAGNOSIS — J019 Acute sinusitis, unspecified: Secondary | ICD-10-CM

## 2013-08-18 MED ORDER — AMOXICILLIN 875 MG PO TABS: 875.0000 mg | ORAL_TABLET | Freq: Two times a day (BID) | ORAL | Status: DC

## 2013-08-18 NOTE — ED Provider Notes (Signed)
CSN: [id number]     Arrival date & time 08/18/13  1250 History   First MD Initiated Contact with Patient 08/18/13 1542     Chief Complaint  Patient presents with  . URI   (Consider location/radiation/quality/duration/timing/severity/associated sxs/prior Treatment) HPI Comments: Pt reports yellow thick nasal discharge  Patient is a 73 y.o. male presenting with URI. The history is provided by the patient.  URI Presenting symptoms: congestion, cough and fatigue   Presenting symptoms: no ear pain, no facial pain, no fever, no rhinorrhea and no sore throat   Severity:  Moderate Onset quality:  Gradual Duration:  5 days Timing:  Constant Progression:  Worsening Chronicity:  New Relieved by:  None tried Worsened by:  Nothing tried Ineffective treatments:  None tried Associated symptoms: sinus pain   Associated symptoms: no wheezing   Risk factors: being elderly     Past Medical History  Diagnosis Date  . HYPERLIPIDEMIA-MIXED 07/22/2008  . CAD, NATIVE VESSEL 07/22/2008     a.  s/p DES to LAD in 2009; s/p repeat DES to LAD 11/2008 (prox to previous stent).  . Edema 12/05/2008  . DIZZINESS, CHRONIC 02/27/2009  . Testicular cancer   . DJD (degenerative joint disease)   . Lung nodule     RLL lung nodule (PET CT 1/10 negative);R parotid hypermetabolic on PET 11/7891  . HYPERTENSION, BENIGN 07/22/2008  . Anginal pain     pressure- last time 11/23/12 saw Dr Harrington Challenger  . Shortness of breath     with exertion  . Stroke 1998ish    TIA  , Left hand stinging , hurts to flex  . History of kidney stones   . GERD (gastroesophageal reflux disease)   . Constipation    Past Surgical History  Procedure Laterality Date  . Appendectomy    . Tonsillectomy    . Rotator cuff repair      right  . Total hip arthroplasty Left   . Bilateral orchiectomy    . Veins stripped    . Coronary artery bypass graft N/A 12/01/2012    Procedure: CORONARY ARTERY BYPASS GRAFTING (CABG);  Surgeon: Gaye Pollack, MD;   Location: [place];  Service: Open Heart Surgery;  Laterality: N/A;   Family History  Problem Relation Age of Onset  . Heart disease Mother   . Hypertension Mother   . Diabetes Mother   . COPD Father   . Aneurysm Brother   . Kidney disease Brother    History  Substance Use Topics  . Smoking status: Never Smoker   . Smokeless tobacco: Never Used  . Alcohol Use: Yes     Comment: rare    Review of Systems  Constitutional: Positive for chills and fatigue. Negative for fever.  HENT: Positive for congestion, postnasal drip and sinus pressure. Negative for ear pain, rhinorrhea and sore throat.   Respiratory: Positive for cough. Negative for wheezing.     Allergies  Review of patient's allergies indicates no known allergies.  Home Medications   Current Outpatient Rx  Name  Route  Sig  Dispense  Refill  . amLODipine (NORVASC) 5 MG tablet      TAKE 1 TABLET BY MOUTH EVERY DAY   30 tablet   6   . amoxicillin (AMOXIL) 875 MG tablet   Oral   Take 1 tablet (875 mg total) by mouth 2 (two) times daily.   20 tablet   0   . aspirin EC 81 MG tablet   Oral  Take 81 mg by mouth daily.         Marland Kitchen atorvastatin (LIPITOR) 20 MG tablet   Oral   Take 1 tablet (20 mg total) by mouth daily at 6 PM.   30 tablet   12   . cholecalciferol (VITAMIN D) 1000 UNITS tablet   Oral   Take 1,000 Units by mouth daily.         . clopidogrel (PLAVIX) 75 MG tablet   Oral   Take 75 mg by mouth daily.         . Coenzyme Q10 (CO Q 10) 100 MG CAPS   Oral   Take 2 capsules by mouth daily.          . Flaxseed, Linseed, (FLAX SEED OIL) 1000 MG CAPS   Oral   Take 1 capsule by mouth daily.          . isosorbide mononitrate (IMDUR) 30 MG 24 hr tablet   Oral   Take 1 tablet (30 mg total) by mouth daily.   90 tablet   3   . metoprolol tartrate (LOPRESSOR) 25 MG tablet   Oral   Take 0.5 tablets (12.5 mg total) by mouth 2 (two) times daily.   30 tablet   12   . Multiple Vitamin  (MULTIVITAMIN WITH MINERALS) TABS   Oral   Take 1 tablet by mouth daily.         . Omega-3 Fatty Acids (FISH OIL) 1000 MG CAPS   Oral   Take 2 capsules by mouth daily.         Marland Kitchen omeprazole (PRILOSEC) 20 MG capsule   Oral   Take 20 mg by mouth daily.          Marland Kitchen oxybutynin (DITROPAN-XL) 5 MG 24 hr tablet   Oral   Take 5 mg by mouth daily.         . polyethylene glycol (MIRALAX / GLYCOLAX) packet   Oral   Take 17 g by mouth daily as needed. constipation         . vitamin C (ASCORBIC ACID) 500 MG tablet   Oral   Take 500 mg by mouth daily.          BP 123/74  Pulse 66  Temp(Src) 98.7 F (37.1 C) (Oral)  Resp 20  SpO2 97% Physical Exam  Constitutional: He appears well-developed and well-nourished. He appears ill. No distress.  HENT:  Right Ear: Tympanic membrane, external ear and ear canal normal.  Left Ear: Tympanic membrane, external ear and ear canal normal.  Nose: Mucosal edema present. Right sinus exhibits maxillary sinus tenderness. Right sinus exhibits no frontal sinus tenderness. Left sinus exhibits maxillary sinus tenderness. Left sinus exhibits no frontal sinus tenderness.  Mouth/Throat: Oropharynx is clear and moist and mucous membranes are normal.  Cardiovascular: Normal rate and regular rhythm.   Pulmonary/Chest: Effort normal and breath sounds normal.  No coughing observed during H&P    ED Course  Procedures (including critical care time) Labs Review Labs Reviewed - No data to display Imaging Review No results found.   MDM   1. Sinusitis, acute   rx amoxicillin 875mg  BID #20. Pt to f/u with pcp to make sure he is getting better; can return here if not improving.      Carvel Getting, NP 08/18/13 1606

## 2013-08-18 NOTE — ED Provider Notes (Signed)
Medical screening examination/treatment/procedure(s) were performed by non-physician practitioner and as supervising physician I was immediately available for consultation/collaboration.  Philipp Deputy, M.D.  Harden Mo, MD 08/18/13 [phone]

## 2013-08-18 NOTE — Discharge Instructions (Signed)
Follow up with Dr. Harrington Challenger next week to make sure you are getting better. If you are getting worse, see Dr. Harrington Challenger sooner or return here.    Sinusitis Sinusitis is redness, soreness, and swelling (inflammation) of the paranasal sinuses. Paranasal sinuses are air pockets within the bones of your face (beneath the eyes, the middle of the forehead, or above the eyes). In healthy paranasal sinuses, mucus is able to drain out, and air is able to circulate through them by way of your nose. However, when your paranasal sinuses are inflamed, mucus and air can become trapped. This can allow bacteria and other germs to grow and cause infection. Sinusitis can develop quickly and last only a short time (acute) or continue over a long period (chronic). Sinusitis that lasts for more than 12 weeks is considered chronic.  CAUSES  Causes of sinusitis include:  Allergies.  Structural abnormalities, such as displacement of the cartilage that separates your nostrils (deviated septum), which can decrease the air flow through your nose and sinuses and affect sinus drainage.  Functional abnormalities, such as when the small hairs (cilia) that line your sinuses and help remove mucus do not work properly or are not present. SYMPTOMS  Symptoms of acute and chronic sinusitis are the same. The primary symptoms are pain and pressure around the affected sinuses. Other symptoms include:  Upper toothache.  Earache.  Headache.  Bad breath.  Decreased sense of smell and taste.  A cough, which worsens when you are lying flat.  Fatigue.  Fever.  Thick drainage from your nose, which often is green and may contain pus (purulent).  Swelling and warmth over the affected sinuses. DIAGNOSIS  Your caregiver will perform a physical exam. During the exam, your caregiver may:  Look in your nose for signs of abnormal growths in your nostrils (nasal polyps).  Tap over the affected sinus to check for signs of infection.  View  the inside of your sinuses (endoscopy) with a special imaging device with a light attached (endoscope), which is inserted into your sinuses. If your caregiver suspects that you have chronic sinusitis, one or more of the following tests may be recommended:  Allergy tests.  Nasal culture A sample of mucus is taken from your nose and sent to a lab and screened for bacteria.  Nasal cytology A sample of mucus is taken from your nose and examined by your caregiver to determine if your sinusitis is related to an allergy. TREATMENT  Most cases of acute sinusitis are related to a viral infection and will resolve on their own within 10 days. Sometimes medicines are prescribed to help relieve symptoms (pain medicine, decongestants, nasal steroid sprays, or saline sprays).  However, for sinusitis related to a bacterial infection, your caregiver will prescribe antibiotic medicines. These are medicines that will help kill the bacteria causing the infection.  Rarely, sinusitis is caused by a fungal infection. In theses cases, your caregiver will prescribe antifungal medicine. For some cases of chronic sinusitis, surgery is needed. Generally, these are cases in which sinusitis recurs more than 3 times per year, despite other treatments. HOME CARE INSTRUCTIONS   Drink plenty of water. Water helps thin the mucus so your sinuses can drain more easily.  Use a humidifier.  Inhale steam 3 to 4 times a day (for example, sit in the bathroom with the shower running).  Apply a warm, moist washcloth to your face 3 to 4 times a day, or as directed by your caregiver.  Use saline  nasal sprays to help moisten and clean your sinuses.  Take over-the-counter or prescription medicines for pain, discomfort, or fever only as directed by your caregiver. SEEK IMMEDIATE MEDICAL CARE IF:  You have increasing pain or severe headaches.  You have nausea, vomiting, or drowsiness.  You have swelling around your face.  You have  vision problems.  You have a stiff neck.  You have difficulty breathing. MAKE SURE YOU:   Understand these instructions.  Will watch your condition.  Will get help right away if you are not doing well or get worse. Document Released: 06/07/2005 Document Revised: 08/30/2011 Document Reviewed: 06/22/2011 [facility] Patient Information 2014 [place], [place].

## 2013-08-18 NOTE — ED Notes (Signed)
Pt   Reports  Symptoms  Of       Cough   /  Congestion             Diarrhea                  Symptoms  X  3  Days             Sitting  Upright on  Exam table   Speaking in  Complete  sentances

## 2013-08-21 ENCOUNTER — Encounter (HOSPITAL_COMMUNITY): Payer: Self-pay

## 2013-08-22 ENCOUNTER — Encounter (HOSPITAL_COMMUNITY): Payer: Self-pay

## 2013-08-23 ENCOUNTER — Encounter (HOSPITAL_COMMUNITY): Payer: Self-pay

## 2013-08-27 ENCOUNTER — Ambulatory Visit (HOSPITAL_BASED_OUTPATIENT_CLINIC_OR_DEPARTMENT_OTHER): Payer: Medicare HMO | Attending: Internal Medicine | Admitting: Radiology

## 2013-08-27 VITALS — Ht 73.0 in | Wt 227.0 lb

## 2013-08-27 DIAGNOSIS — R0609 Other forms of dyspnea: Secondary | ICD-10-CM | POA: Insufficient documentation

## 2013-08-27 DIAGNOSIS — R0989 Other specified symptoms and signs involving the circulatory and respiratory systems: Secondary | ICD-10-CM | POA: Insufficient documentation

## 2013-08-27 DIAGNOSIS — R259 Unspecified abnormal involuntary movements: Secondary | ICD-10-CM | POA: Insufficient documentation

## 2013-08-27 DIAGNOSIS — G4733 Obstructive sleep apnea (adult) (pediatric): Secondary | ICD-10-CM

## 2013-08-27 DIAGNOSIS — G4730 Sleep apnea, unspecified: Principal | ICD-10-CM

## 2013-08-27 DIAGNOSIS — R0683 Snoring: Secondary | ICD-10-CM

## 2013-08-27 DIAGNOSIS — G4710 Hypersomnia, unspecified: Secondary | ICD-10-CM | POA: Insufficient documentation

## 2013-08-28 ENCOUNTER — Encounter (HOSPITAL_COMMUNITY): Payer: Self-pay

## 2013-08-29 ENCOUNTER — Encounter (HOSPITAL_COMMUNITY): Payer: Self-pay

## 2013-08-30 ENCOUNTER — Encounter (HOSPITAL_COMMUNITY): Payer: Self-pay

## 2013-09-04 ENCOUNTER — Encounter (HOSPITAL_COMMUNITY): Payer: Self-pay

## 2013-09-04 DIAGNOSIS — G4733 Obstructive sleep apnea (adult) (pediatric): Secondary | ICD-10-CM

## 2013-09-04 DIAGNOSIS — R0989 Other specified symptoms and signs involving the circulatory and respiratory systems: Secondary | ICD-10-CM

## 2013-09-04 DIAGNOSIS — R0609 Other forms of dyspnea: Secondary | ICD-10-CM

## 2013-09-04 NOTE — Sleep Study (Signed)
   NAME: [name] DATE OF BIRTH:  10/04/1940 MEDICAL RECORD NUMBER [id number]  LOCATION: [facility]  PHYSICIAN: Kathee Delton  DATE OF STUDY: 08/27/2013  SLEEP STUDY TYPE: Nocturnal Polysomnogram               REFERRING PHYSICIAN: Fay Records, MD  INDICATION FOR STUDY: Hypersomnia with sleep apnea  EPWORTH SLEEPINESS SCORE:  19 HEIGHT: 6\' 1"  (185.4 cm)  WEIGHT: 227 lb (102.967 kg)    Body mass index is 29.96 kg/(m^2).  NECK SIZE: 15 in.  MEDICATIONS: Reviewed in sleep chart  SLEEP ARCHITECTURE: The patient had a total sleep time of 323 minutes with no slow-wave sleep and decreased quantity of REM. Sleep onset latency was normal at 18 minutes, and REM onset was prolonged at 165 minutes. Sleep efficiency was moderately reduced at 80%.  RESPIRATORY DATA: The patient was found to have 1 apnea and 13 obstructive hypopneas, giving him an AHI of only 3 events per hour. The events occurred in all body positions, and there was moderate snoring noted throughout. The patient did not meet split-night criteria secondary to his small numbers of events.  OXYGEN DATA: There was transient oxygen desaturation as low as 87% during the night.  CARDIAC DATA: Rare PVC noted  MOVEMENT/PARASOMNIA: The patient was noted to have 468 periodic limb movements, with a per hour resulting in arousal or awakening. There were no abnormal behaviors noted.  IMPRESSION/ RECOMMENDATION:    1) small numbers of obstructive events which do not meet the AHI criteria for the obstructive sleep apnea syndrome. The patient did have moderate snoring, and should be encouraged to work aggressively on weight loss.  2) rare PVC noted, but no clinically significant arrhythmias were seen.  3) very large numbers of periodic limb movements, with what appears to be significant sleep disruption. Clinical correlation is suggested, to see if the patient has a history consistent with a primary movement  disorder of sleep. This may be the prime issue with respect to his sleep disruption.     [place], American Board of Sleep Medicine  ELECTRONICALLY SIGNED ON:  09/04/2013, 10:01 AM [place] PH: [phone]   FX: [phone] [place]

## 2013-09-05 ENCOUNTER — Encounter (HOSPITAL_COMMUNITY): Payer: Self-pay

## 2013-09-06 ENCOUNTER — Encounter (HOSPITAL_COMMUNITY): Payer: Self-pay

## 2013-09-11 ENCOUNTER — Encounter (HOSPITAL_COMMUNITY): Payer: Self-pay

## 2013-09-12 ENCOUNTER — Encounter (HOSPITAL_COMMUNITY): Payer: Self-pay

## 2013-09-13 ENCOUNTER — Encounter (HOSPITAL_COMMUNITY): Payer: Self-pay

## 2013-09-18 ENCOUNTER — Encounter (HOSPITAL_COMMUNITY): Payer: Self-pay

## 2013-09-19 ENCOUNTER — Encounter (HOSPITAL_COMMUNITY): Payer: Self-pay

## 2013-09-20 ENCOUNTER — Encounter (HOSPITAL_COMMUNITY): Payer: Self-pay

## 2013-09-25 ENCOUNTER — Encounter (HOSPITAL_COMMUNITY): Payer: Self-pay

## 2013-09-26 ENCOUNTER — Encounter (HOSPITAL_COMMUNITY): Payer: Self-pay

## 2013-09-27 ENCOUNTER — Encounter (HOSPITAL_COMMUNITY): Payer: Self-pay

## 2013-10-02 ENCOUNTER — Encounter (HOSPITAL_COMMUNITY): Payer: Self-pay

## 2013-10-03 ENCOUNTER — Encounter (HOSPITAL_COMMUNITY): Payer: Self-pay

## 2013-10-04 ENCOUNTER — Encounter (HOSPITAL_COMMUNITY): Payer: Self-pay

## 2013-10-04 ENCOUNTER — Telehealth: Payer: Self-pay | Admitting: Internal Medicine

## 2013-10-04 NOTE — Telephone Encounter (Signed)
Patient has complained of fatigue. Sleep study showed no signif apnea. Reviewed meds.  He could try stopping metoprolol though I do not think he would notice a difference On ASA and Plavix  Now over 4 yrs from 2nd PCI/stent to LAD  Could prob stop Plavix.   He should have CBC and TSH checked.  He is going to [place] [facility] tomorrow  Will see if they can do there.

## 2013-10-09 ENCOUNTER — Encounter (HOSPITAL_COMMUNITY): Payer: Self-pay

## 2013-10-10 ENCOUNTER — Encounter (HOSPITAL_COMMUNITY): Payer: Self-pay

## 2013-10-11 ENCOUNTER — Encounter (HOSPITAL_COMMUNITY): Payer: Self-pay

## 2013-10-16 ENCOUNTER — Encounter (HOSPITAL_COMMUNITY): Payer: Self-pay

## 2013-10-17 ENCOUNTER — Encounter (HOSPITAL_COMMUNITY): Payer: Self-pay

## 2013-10-17 ENCOUNTER — Other Ambulatory Visit: Payer: Self-pay | Admitting: Internal Medicine

## 2013-10-18 ENCOUNTER — Encounter (HOSPITAL_COMMUNITY): Payer: Self-pay

## 2013-10-22 ENCOUNTER — Encounter: Payer: Self-pay | Admitting: Internal Medicine

## 2013-10-22 ENCOUNTER — Ambulatory Visit (INDEPENDENT_AMBULATORY_CARE_PROVIDER_SITE_OTHER): Payer: Medicare HMO | Admitting: Internal Medicine

## 2013-10-22 VITALS — BP 120/60 | HR 88 | Ht 73.0 in | Wt 232.0 lb

## 2013-10-22 DIAGNOSIS — I10 Essential (primary) hypertension: Secondary | ICD-10-CM

## 2013-10-22 DIAGNOSIS — E785 Hyperlipidemia, unspecified: Secondary | ICD-10-CM

## 2013-10-22 DIAGNOSIS — I2510 Atherosclerotic heart disease of native coronary artery without angina pectoris: Secondary | ICD-10-CM

## 2013-10-22 NOTE — Progress Notes (Signed)
HPI [name] is a 73 yo white male with known history of CAD. He underwent PCI with placement of a drug eluting stent to his LAD in 2009. Then again in June 2010 he underwent placement of another drug eluting stent to his LAD proximal to the previous. The patient has done well since then. He underwent Myoview study in 2013 which was negative.  The patient was admitted earlier this summer for squeezing sensation of chest.  Had cardiac cathe that showed severe 2V CAD. He  underwent CABG x 2 utilizing LIMA to OM and RIMA to RCA. He was discharged and has finished cardiac rehab. Complained of fatigue  Sleep study showed no signif apnea.   Working a lot still  Is thinking of retiring.   No Known Allergies  Current Outpatient Prescriptions  Medication Sig Dispense Refill  . amLODipine (NORVASC) 5 MG tablet TAKE 1 TABLET BY MOUTH EVERY DAY  30 tablet  6  . aspirin EC 81 MG tablet Take 81 mg by mouth daily.      Marland Kitchen atorvastatin (LIPITOR) 20 MG tablet Take 1 tablet (20 mg total) by mouth daily at 6 PM.  30 tablet  12  . cholecalciferol (VITAMIN D) 1000 UNITS tablet Take 1,000 Units by mouth daily.      . clopidogrel (PLAVIX) 75 MG tablet Take 75 mg by mouth daily.      . Coenzyme Q10 (CO Q 10) 100 MG CAPS Take 2 capsules by mouth daily.       . Flaxseed, Linseed, (FLAX SEED OIL) 1000 MG CAPS Take 1 capsule by mouth daily.       . isosorbide mononitrate (IMDUR) 30 MG 24 hr tablet Take 1 tablet (30 mg total) by mouth daily.  90 tablet  3  . metoprolol tartrate (LOPRESSOR) 25 MG tablet Take 0.5 tablets (12.5 mg total) by mouth 2 (two) times daily.  30 tablet  12  . Multiple Vitamin (MULTIVITAMIN WITH MINERALS) TABS Take 1 tablet by mouth daily.      . Omega-3 Fatty Acids (FISH OIL) 1000 MG CAPS Take 2 capsules by mouth daily.      Marland Kitchen omeprazole (PRILOSEC) 20 MG capsule Take 20 mg by mouth daily.       Marland Kitchen oxybutynin (DITROPAN-XL) 5 MG 24 hr tablet Take 5 mg by mouth daily.      . polyethylene glycol (MIRALAX  / GLYCOLAX) packet Take 17 g by mouth daily as needed. constipation      . vitamin Randall (ASCORBIC ACID) 500 MG tablet Take 500 mg by mouth daily.       No current facility-administered medications for this visit.    Past Medical History  Diagnosis Date  . HYPERLIPIDEMIA-MIXED 07/22/2008  . CAD, NATIVE VESSEL 07/22/2008     a.  s/p DES to LAD in 2009; s/p repeat DES to LAD 11/2008 (prox to previous stent).  . Edema 12/05/2008  . DIZZINESS, CHRONIC 02/27/2009  . Testicular cancer   . DJD (degenerative joint disease)   . Lung nodule     RLL lung nodule (PET CT 1/10 negative);R parotid hypermetabolic on PET 06/6008  . HYPERTENSION, BENIGN 07/22/2008  . Anginal pain     pressure- last time 11/23/12 saw Dr [name]  . Shortness of breath     with exertion  . Stroke 1998ish    TIA  , Left hand stinging , hurts to flex  . History of kidney stones   . GERD (gastroesophageal reflux disease)   .  Constipation     Past Surgical History  Procedure Laterality Date  . Appendectomy    . Tonsillectomy    . Rotator cuff repair      right  . Total hip arthroplasty Left   . Bilateral orchiectomy    . Veins stripped    . Coronary artery bypass graft N/A 12/01/2012    Procedure: CORONARY ARTERY BYPASS GRAFTING (CABG);  Surgeon: Randall Pollack, MD;  Location: [facility];  Service: Open Heart Surgery;  Laterality: N/A;    Family History  Problem Relation Age of Onset  . Heart disease Mother   . Hypertension Mother   . Diabetes Mother   . COPD Father   . Aneurysm Brother   . Kidney disease Brother     History   Social History  . Marital Status: Widowed    Spouse Name: N/A    Number of Children: N/A  . Years of Education: N/A   Occupational History  . Not on file.   Social History Main Topics  . Smoking status: Never Smoker   . Smokeless tobacco: Never Used  . Alcohol Use: Yes     Comment: rare  . Drug Use: No  . Sexual Activity: Not Currently   Other Topics Concern  . Not on file   Social  History Narrative  . No narrative on file    Review of Systems:  All systems reviewed.  They are negative to the above problem except as previously stated.  Vital Signs: BP 120/60  Pulse 88  Ht 6\' 1"  (1.854 m)  Wt 232 lb (105.235 kg)  BMI 30.62 kg/m2  Physical Exam Patient is an obese 73 yo in NAD HEENT:  Normocephalic, atraumatic. EOMI, PERRLA.  Neck: JVP is normal.  No bruits.  Lungs: clear to auscultation. No rales no wheezes.  Heart: Regular rate and rhythm. Normal S1, S2. No S3.   No significant murmurs. PMI not displaced.  Abdomen:  Supple, nontender. Normal bowel sounds. No masses. No hepatomegaly.  Extremities:   Good distal pulses throughout. No lower extremity edema.  Musculoskeletal :moving all extremities.  Neuro:   alert and oriented x3.  CN II-XII grossly intact.  SR 61 bpm  First degree AV block  212 msec.   Assessment and Plan:    1.  CAD  Doing well post CABG  Check labs  Would keep on Plavix given LAD stents.  Discussed with [name]  2.  HL Lipids good in the fall  Will not recheck  Patient hs to lose wt  3.  Obesity  Pt says will enter weight watchers program

## 2013-10-22 NOTE — Patient Instructions (Signed)
Your physician wants you to follow-up in: 7 MONTHS WITH DR ROSS You will receive a reminder letter in the mail two months in advance. If you don't receive a letter, please call our office to schedule the follow-up appointment.  

## 2013-10-23 ENCOUNTER — Encounter (HOSPITAL_COMMUNITY): Payer: Self-pay

## 2013-10-24 ENCOUNTER — Encounter (HOSPITAL_COMMUNITY): Payer: Self-pay

## 2013-10-25 ENCOUNTER — Encounter (HOSPITAL_COMMUNITY): Payer: Self-pay

## 2013-10-30 ENCOUNTER — Encounter (HOSPITAL_COMMUNITY): Payer: Self-pay

## 2013-10-31 ENCOUNTER — Encounter (HOSPITAL_COMMUNITY): Payer: Self-pay

## 2013-11-01 ENCOUNTER — Encounter (HOSPITAL_COMMUNITY): Payer: Self-pay

## 2013-11-06 ENCOUNTER — Encounter (HOSPITAL_COMMUNITY): Payer: Self-pay

## 2013-11-07 ENCOUNTER — Encounter (HOSPITAL_COMMUNITY): Payer: Self-pay

## 2013-11-08 ENCOUNTER — Encounter (HOSPITAL_COMMUNITY): Payer: Self-pay

## 2013-11-13 ENCOUNTER — Encounter (HOSPITAL_COMMUNITY): Payer: Self-pay

## 2013-11-14 ENCOUNTER — Encounter (HOSPITAL_COMMUNITY): Payer: Self-pay

## 2013-11-15 ENCOUNTER — Encounter (HOSPITAL_COMMUNITY): Payer: Self-pay

## 2013-11-20 ENCOUNTER — Encounter (HOSPITAL_COMMUNITY): Payer: Self-pay

## 2013-11-21 ENCOUNTER — Encounter (HOSPITAL_COMMUNITY): Payer: Self-pay

## 2013-11-21 ENCOUNTER — Other Ambulatory Visit: Payer: Self-pay | Admitting: Cardiology

## 2013-11-22 ENCOUNTER — Encounter (HOSPITAL_COMMUNITY): Payer: Self-pay

## 2013-11-27 ENCOUNTER — Encounter (HOSPITAL_COMMUNITY): Payer: Self-pay

## 2013-11-28 ENCOUNTER — Encounter (HOSPITAL_COMMUNITY): Payer: Self-pay

## 2013-11-29 ENCOUNTER — Encounter (HOSPITAL_COMMUNITY): Payer: Self-pay

## 2013-12-04 ENCOUNTER — Encounter (HOSPITAL_COMMUNITY): Payer: Self-pay

## 2013-12-05 ENCOUNTER — Encounter (HOSPITAL_COMMUNITY): Payer: Self-pay

## 2013-12-06 ENCOUNTER — Encounter (HOSPITAL_COMMUNITY): Payer: Self-pay

## 2013-12-11 ENCOUNTER — Encounter (HOSPITAL_COMMUNITY): Payer: Self-pay

## 2013-12-12 ENCOUNTER — Encounter (HOSPITAL_COMMUNITY): Payer: Self-pay

## 2013-12-13 ENCOUNTER — Encounter (HOSPITAL_COMMUNITY): Payer: Self-pay

## 2013-12-16 ENCOUNTER — Other Ambulatory Visit: Payer: Self-pay | Admitting: Internal Medicine

## 2013-12-18 ENCOUNTER — Encounter (HOSPITAL_COMMUNITY): Payer: Self-pay

## 2013-12-25 ENCOUNTER — Other Ambulatory Visit: Payer: Self-pay | Admitting: Internal Medicine

## 2013-12-29 ENCOUNTER — Other Ambulatory Visit: Payer: Self-pay | Admitting: Internal Medicine

## 2014-01-03 ENCOUNTER — Other Ambulatory Visit (HOSPITAL_COMMUNITY): Payer: Self-pay | Admitting: Radiology

## 2014-01-03 DIAGNOSIS — I6529 Occlusion and stenosis of unspecified carotid artery: Secondary | ICD-10-CM

## 2014-01-15 ENCOUNTER — Telehealth: Payer: Self-pay | Admitting: *Deleted

## 2014-01-15 ENCOUNTER — Other Ambulatory Visit: Payer: Self-pay | Admitting: *Deleted

## 2014-01-15 ENCOUNTER — Ambulatory Visit (HOSPITAL_COMMUNITY): Payer: Medicare HMO | Attending: Internal Medicine | Admitting: Cardiology

## 2014-01-15 DIAGNOSIS — I10 Essential (primary) hypertension: Secondary | ICD-10-CM | POA: Diagnosis not present

## 2014-01-15 DIAGNOSIS — I658 Occlusion and stenosis of other precerebral arteries: Secondary | ICD-10-CM | POA: Diagnosis not present

## 2014-01-15 DIAGNOSIS — E785 Hyperlipidemia, unspecified: Secondary | ICD-10-CM | POA: Diagnosis not present

## 2014-01-15 DIAGNOSIS — I2510 Atherosclerotic heart disease of native coronary artery without angina pectoris: Secondary | ICD-10-CM | POA: Insufficient documentation

## 2014-01-15 DIAGNOSIS — Z951 Presence of aortocoronary bypass graft: Secondary | ICD-10-CM | POA: Insufficient documentation

## 2014-01-15 DIAGNOSIS — I6529 Occlusion and stenosis of unspecified carotid artery: Secondary | ICD-10-CM | POA: Diagnosis not present

## 2014-01-15 MED ORDER — ATORVASTATIN CALCIUM 20 MG PO TABS: 20.0000 mg | ORAL_TABLET | Freq: Every day | ORAL | Status: DC

## 2014-01-15 NOTE — Telephone Encounter (Signed)
Message from Dr. Harrington Challenger confirm that pt has 90 day supply refill of lipitor. Sent refill for 90 days to pharmacy, 3 refills. Called patients mobile # to inform, was unable to leave message.

## 2014-01-15 NOTE — Progress Notes (Signed)
Carotid duplex performed 

## 2014-01-22 ENCOUNTER — Telehealth: Payer: Self-pay | Admitting: *Deleted

## 2014-01-22 DIAGNOSIS — E785 Hyperlipidemia, unspecified: Secondary | ICD-10-CM

## 2014-01-22 DIAGNOSIS — I6521 Occlusion and stenosis of right carotid artery: Secondary | ICD-10-CM

## 2014-01-22 MED ORDER — ATORVASTATIN CALCIUM 40 MG PO TABS: 40.0000 mg | ORAL_TABLET | Freq: Every day | ORAL | Status: DC

## 2014-01-22 NOTE — Telephone Encounter (Signed)
Notes Recorded by Fay Records, MD on 01/22/2014 at 8:54 AM Mild CV plaquing R ICA is slightly more compared to 1 year ago Will need repeat carotid USN in 1 year I would recomm increasing lipitor to 40 mg F/U lipid panel in 12 wks

## 2014-02-07 ENCOUNTER — Encounter: Payer: Self-pay | Admitting: Gastroenterology

## 2014-02-12 ENCOUNTER — Encounter: Payer: Self-pay | Admitting: Gastroenterology

## 2014-03-24 ENCOUNTER — Other Ambulatory Visit: Payer: Self-pay | Admitting: Internal Medicine

## 2014-04-02 ENCOUNTER — Encounter: Payer: Self-pay | Admitting: Internal Medicine

## 2014-04-16 ENCOUNTER — Other Ambulatory Visit (INDEPENDENT_AMBULATORY_CARE_PROVIDER_SITE_OTHER): Payer: Medicare HMO

## 2014-04-16 DIAGNOSIS — D649 Anemia, unspecified: Secondary | ICD-10-CM

## 2014-04-16 DIAGNOSIS — E785 Hyperlipidemia, unspecified: Secondary | ICD-10-CM

## 2014-04-16 DIAGNOSIS — I6521 Occlusion and stenosis of right carotid artery: Secondary | ICD-10-CM

## 2014-04-16 LAB — CBC WITH DIFFERENTIAL/PLATELET
Basophils Absolute: 0 10*3/uL (ref 0.0–0.1)
Basophils Relative: 0.5 % (ref 0.0–3.0)
EOS ABS: 0.2 10*3/uL (ref 0.0–0.7)
Eosinophils Relative: 3.9 % (ref 0.0–5.0)
HEMATOCRIT: 38.4 % — AB (ref 39.0–52.0)
HEMOGLOBIN: 12.7 g/dL — AB (ref 13.0–17.0)
LYMPHS ABS: 1.4 10*3/uL (ref 0.7–4.0)
Lymphocytes Relative: 30.7 % (ref 12.0–46.0)
MCHC: 33.1 g/dL (ref 30.0–36.0)
MCV: 94.7 fl (ref 78.0–100.0)
MONOS PCT: 11.2 % (ref 3.0–12.0)
Monocytes Absolute: 0.5 10*3/uL (ref 0.1–1.0)
NEUTROS ABS: 2.5 10*3/uL (ref 1.4–7.7)
Neutrophils Relative %: 53.7 % (ref 43.0–77.0)
Platelets: 224 10*3/uL (ref 150.0–400.0)
RBC: 4.06 Mil/uL — ABNORMAL LOW (ref 4.22–5.81)
RDW: 14.1 % (ref 11.5–15.5)
WBC: 4.7 10*3/uL (ref 4.0–10.5)

## 2014-04-16 LAB — HEPATIC FUNCTION PANEL
ALT: 2 U/L (ref 0–53)
AST: 18 U/L (ref 0–37)
Albumin: 3.5 g/dL (ref 3.5–5.2)
Alkaline Phosphatase: 61 U/L (ref 39–117)
Bilirubin, Direct: 0 mg/dL (ref 0.0–0.3)
Total Bilirubin: 0.1 mg/dL — ABNORMAL LOW (ref 0.2–1.2)
Total Protein: 6.9 g/dL (ref 6.0–8.3)

## 2014-04-16 LAB — LIPID PANEL
CHOL/HDL RATIO: 4
Cholesterol: 127 mg/dL (ref 0–200)
HDL: 33.3 mg/dL — ABNORMAL LOW (ref >39.00)
LDL Cholesterol: 57 mg/dL (ref 0–99)
NONHDL: 93.7
Triglycerides: 186 mg/dL — ABNORMAL HIGH (ref 0.0–149.0)
VLDL: 37.2 mg/dL (ref 0.0–40.0)

## 2014-04-19 ENCOUNTER — Other Ambulatory Visit: Payer: Self-pay | Admitting: Internal Medicine

## 2014-04-19 ENCOUNTER — Telehealth: Payer: Self-pay | Admitting: *Deleted

## 2014-04-19 DIAGNOSIS — D649 Anemia, unspecified: Secondary | ICD-10-CM

## 2014-04-19 NOTE — Telephone Encounter (Signed)
Advised patient of lab results and will recheck CBC in January at [facility]

## 2014-04-19 NOTE — Telephone Encounter (Signed)
Message copied by Earvin Hansen on Fri Apr 19, 2014 12:11 PM ------      Message from: Dorris Carnes V      Created: Wed Apr 17, 2014 10:08 PM       Lipids are excellent      CBC shows hemoglobin is minimally decreased  Will follow      No changes      Please check on return appt ------

## 2014-06-30 NOTE — Progress Notes (Signed)
HPI [name] is a 74 yo white male with known history of CAD. He underwent PCI with placement of a drug eluting stent to his LAD in 2009. Then again in June 2010 he underwent placement of another drug eluting stent to his LAD proximal to the previous. Marland Kitchen He  underwent CABG x 2 utilizing LIMA to OM and RIMA to RCA. In June 2014  He was last seen in Spring 2015   IN October LDL was 57, HDL was 33.  Hgb minimally decreased   He says he still feels fatigued  Sleeps with mouth open Dry No CP   No Known Allergies  Current Outpatient Prescriptions  Medication Sig Dispense Refill  . amLODipine (NORVASC) 5 MG tablet TAKE 1 TABLET BY MOUTH EVERY DAY 30 tablet 1  . aspirin EC 81 MG tablet Take 81 mg by mouth daily.    Marland Kitchen atorvastatin (LIPITOR) 40 MG tablet Take 1 tablet (40 mg total) by mouth daily. 30 tablet 4  . cholecalciferol (VITAMIN D) 1000 UNITS tablet Take 1,000 Units by mouth daily.    . clopidogrel (PLAVIX) 75 MG tablet Take 75 mg by mouth daily.    . Coenzyme Q10 (CO Q 10) 100 MG CAPS Take 2 capsules by mouth daily.     . Flaxseed, Linseed, (FLAX SEED OIL) 1000 MG CAPS Take 1 capsule by mouth daily.     . isosorbide mononitrate (IMDUR) 30 MG 24 hr tablet Take 1 tablet (30 mg total) by mouth daily. 90 tablet 3  . metoprolol tartrate (LOPRESSOR) 25 MG tablet Take 0.5 tablets (12.5 mg total) by mouth 2 (two) times daily. 30 tablet 12  . Multiple Vitamin (MULTIVITAMIN WITH MINERALS) TABS Take 1 tablet by mouth daily.    . Omega-3 Fatty Acids (FISH OIL) 1000 MG CAPS Take 2 capsules by mouth daily.    Marland Kitchen omeprazole (PRILOSEC) 20 MG capsule Take 20 mg by mouth daily.     Marland Kitchen oxybutynin (DITROPAN-XL) 5 MG 24 hr tablet Take 5 mg by mouth daily.    . polyethylene glycol (MIRALAX / GLYCOLAX) packet Take 17 g by mouth daily as needed. constipation    . vitamin C (ASCORBIC ACID) 500 MG tablet Take 500 mg by mouth daily.     No current facility-administered medications for this visit.    Past Medical  History  Diagnosis Date  . HYPERLIPIDEMIA-MIXED 07/22/2008  . CAD, NATIVE VESSEL 07/22/2008     a.  s/p DES to LAD in 2009; s/p repeat DES to LAD 11/2008 (prox to previous stent).  . Edema 12/05/2008  . DIZZINESS, CHRONIC 02/27/2009  . Testicular cancer   . DJD (degenerative joint disease)   . Lung nodule     RLL lung nodule (PET CT 1/10 negative);R parotid hypermetabolic on PET 0/1779  . HYPERTENSION, BENIGN 07/22/2008  . Anginal pain     pressure- last time 11/23/12 saw Dr Harrington Challenger  . Shortness of breath     with exertion  . Stroke 1998ish    TIA  , Left hand stinging , hurts to flex  . History of kidney stones   . GERD (gastroesophageal reflux disease)   . Constipation     Past Surgical History  Procedure Laterality Date  . Appendectomy    . Tonsillectomy    . Rotator cuff repair      right  . Total hip arthroplasty Left   . Bilateral orchiectomy    . Veins stripped    . Coronary artery bypass  graft N/A 12/01/2012    Procedure: CORONARY ARTERY BYPASS GRAFTING (CABG);  Surgeon: Gaye Pollack, MD;  Location: [facility];  Service: Open Heart Surgery;  Laterality: N/A;    Family History  Problem Relation Age of Onset  . Heart disease Mother   . Hypertension Mother   . Diabetes Mother   . COPD Father   . Aneurysm Brother   . Kidney disease Brother   . Heart attack Neg Hx   . Stroke Neg Hx     History   Social History  . Marital Status: Widowed    Spouse Name: N/A    Number of Children: N/A  . Years of Education: N/A   Occupational History  . Not on file.   Social History Main Topics  . Smoking status: Never Smoker   . Smokeless tobacco: Never Used  . Alcohol Use: Yes     Comment: rare  . Drug Use: No  . Sexual Activity: Not Currently   Other Topics Concern  . Not on file   Social History Narrative    Review of Systems:  All systems reviewed.  They are negative to the above problem except as previously stated.  Vital Signs: BP 112/60 mmHg  Pulse 57  Ht 6\' 1"   (1.854 m)  Wt 207 lb (93.895 kg)  BMI 27.32 kg/m2  Physical Exam Patient is an obese 74 yo in NAD HEENT:  Normocephalic, atraumatic. EOMI, PERRLA.  Neck: JVP is normal.  No bruits.  Lungs: clear to auscultation. No rales no wheezes.  Heart: Regular rate and rhythm. Normal S1, S2. No S3.   No significant murmurs. PMI not displaced.  Abdomen:  Supple, nontender. Normal bowel sounds. No masses. No hepatomegaly.  Extremities:   Good distal pulses throughout. No lower extremity edema.  Musculoskeletal :moving all extremities.  Neuro:   alert and oriented x3.  CN II-XII grossly intact.  SB 57  bpm  First degree AV block  222 msec.   Assessment and Plan:    1.  CAD  Doing well post CABG  s  Would keep on Plavix given LAD stents.  Discussed with C McAlhany  2.  HL Lipids are good     3. Fatigue  Will check TSH and CBC

## 2014-07-01 ENCOUNTER — Ambulatory Visit (INDEPENDENT_AMBULATORY_CARE_PROVIDER_SITE_OTHER): Payer: Medicare HMO | Admitting: Internal Medicine

## 2014-07-01 ENCOUNTER — Encounter: Payer: Self-pay | Admitting: Internal Medicine

## 2014-07-01 VITALS — BP 112/60 | HR 57 | Ht 73.0 in | Wt 207.0 lb

## 2014-07-01 DIAGNOSIS — R5383 Other fatigue: Secondary | ICD-10-CM

## 2014-07-01 DIAGNOSIS — I10 Essential (primary) hypertension: Secondary | ICD-10-CM

## 2014-07-01 DIAGNOSIS — I2510 Atherosclerotic heart disease of native coronary artery without angina pectoris: Secondary | ICD-10-CM

## 2014-07-01 LAB — CBC WITH DIFFERENTIAL/PLATELET
BASOS ABS: 0 10*3/uL (ref 0.0–0.1)
BASOS PCT: 0.5 % (ref 0.0–3.0)
Eosinophils Absolute: 0.2 10*3/uL (ref 0.0–0.7)
Eosinophils Relative: 4.3 % (ref 0.0–5.0)
HCT: 38.9 % — ABNORMAL LOW (ref 39.0–52.0)
Hemoglobin: 12.8 g/dL — ABNORMAL LOW (ref 13.0–17.0)
Lymphocytes Relative: 23.2 % (ref 12.0–46.0)
Lymphs Abs: 1.1 10*3/uL (ref 0.7–4.0)
MCHC: 32.8 g/dL (ref 30.0–36.0)
MCV: 94.3 fl (ref 78.0–100.0)
MONO ABS: 0.6 10*3/uL (ref 0.1–1.0)
Monocytes Relative: 13.3 % — ABNORMAL HIGH (ref 3.0–12.0)
Neutro Abs: 2.7 10*3/uL (ref 1.4–7.7)
Neutrophils Relative %: 58.7 % (ref 43.0–77.0)
PLATELETS: 217 10*3/uL (ref 150.0–400.0)
RBC: 4.12 Mil/uL — ABNORMAL LOW (ref 4.22–5.81)
RDW: 14.7 % (ref 11.5–15.5)
WBC: 4.6 10*3/uL (ref 4.0–10.5)

## 2014-07-01 LAB — TSH: TSH: 0.67 u[IU]/mL (ref 0.35–4.50)

## 2014-07-01 NOTE — Patient Instructions (Addendum)
Your physician has recommended you make the following change in your medication:  1) STOP Metoprolol - decrease to 1 tablet daily for a few days then STOP  Your physician recommends that you have lab work:  TODAY - Thyroid, CBC  Your physician wants you to follow-up in: 1 year with Dr. Harrington Challenger.  You will receive a reminder letter in the mail two months in advance. If you don't receive a letter, please call our office to schedule the follow-up appointment.

## 2014-07-30 ENCOUNTER — Other Ambulatory Visit: Payer: Self-pay | Admitting: Internal Medicine

## 2014-08-02 ENCOUNTER — Other Ambulatory Visit: Payer: Self-pay

## 2014-08-02 MED ORDER — ISOSORBIDE MONONITRATE ER 30 MG PO TB24: 30.0000 mg | ORAL_TABLET | Freq: Every day | ORAL | Status: DC

## 2014-08-21 ENCOUNTER — Encounter: Payer: Self-pay | Admitting: Gastroenterology

## 2014-10-10 ENCOUNTER — Telehealth: Payer: Self-pay | Admitting: Internal Medicine

## 2014-10-10 NOTE — Telephone Encounter (Signed)
New message      Pt is complaining about dizziness and double vision.  Please advise

## 2014-10-10 NOTE — Telephone Encounter (Signed)
Pt called in c/o of dizziness and blurred vision for the past month. Today it has gotten much worse and just won't seem to go away. Pt has not had any changes in lifestyle, diet, or medications in the past month.  Pt had an endoscopy and colonoscopy earlier this week and thinks that he might still be a dehydrated from which is why it has gotten worse. Pt encouraged to keep drinking water and also drink some Gadorade. Pt stated that about that same time he started to have chest tightness and heaviness and it has been a week since having this symptom. Pt is worried there is something more going on as this is something new since his office visit with Dr. Harrington Challenger in January.   Pt schedule to see flex PA tomorrow 4/22 at @ 8:30 AM to be evaluated Pt agrees with plan and understands if anything changes and gets worse he is to call 911 or go to the emergency room.

## 2014-10-11 ENCOUNTER — Encounter: Payer: Self-pay | Admitting: Physician Assistant

## 2014-10-11 ENCOUNTER — Ambulatory Visit (INDEPENDENT_AMBULATORY_CARE_PROVIDER_SITE_OTHER): Payer: Medicare HMO | Admitting: Physician Assistant

## 2014-10-11 VITALS — BP 102/62 | HR 70 | Ht 73.0 in | Wt 207.2 lb

## 2014-10-11 DIAGNOSIS — I10 Essential (primary) hypertension: Secondary | ICD-10-CM

## 2014-10-11 DIAGNOSIS — R42 Dizziness and giddiness: Secondary | ICD-10-CM | POA: Diagnosis not present

## 2014-10-11 NOTE — Progress Notes (Signed)
Cardiology Office Note   Date:  10/11/2014   ID:  [name], DOB 1941/06/13, MRN [id number]  PCP:  Colen Darling, MD  Cardiologist:  Dr Recardo Evangelist, PA-C   Chief Complaint  Patient presents with  . Dizziness    blured vision    History of Present Illness: [name] is a 74 y.o. male with a history of PCI with DES to his LAD in 2009, June 2010 DES to his LAD proximal to the previous stent. CABG x 2 w/ LIMA to OM and RIMA to RCA in June 2014.  Last o.v. 06/2014 and was doing well post CABG. It was recommended he stay on Plavix and continue treatment of hyperlipidemia. A TSH and CBC were checked because of fatigue but had no significant abnormalities.  He presents for evaluation of chest pain and dizziness. Not clearly exertional or orthostatic. He has had multiple episodes of dizziness with some associated weakness. He has also gotten some visual changes including blurred vision and seeing things as negatives (once).   The symptoms were intermittent and he had not previously been evaluated for them. However, he had an EGD and colonoscopy on 04/20. On 04/21, he was exerting himself pulling props for an event, and had dizziness that was worse than usual and visual changes that will worsen usual. He did not feel safe driving. He rested while someone else drive, and the symptoms improved in about 20 minutes.  He called the office and was told to drink extra water and some Gatorade. He feels that his symptoms improved after that. He has been urinating frequently but has not had a bowel movement since the colonoscopy.   He has had some episodes of abdominal fullness and tightness as well as chest tightness. These are also not clearly exertional. They reach no more than 3 or 4/10. They resolve spontaneously. There are no known aggravating or alleviating factors.   He is currently asymptomatic. He is compliant with his medications. No known bleeding issues. He had a  recent eye exam that was okay. He has not had any recent lab work done. He gets most of his general medical care at the [place].   Past Medical History  Diagnosis Date  . HYPERLIPIDEMIA-MIXED 07/22/2008  . CAD, NATIVE VESSEL 07/22/2008     a.  s/p DES to LAD in 2009; s/p repeat DES to LAD 11/2008 (prox to previous stent).  . Edema 12/05/2008  . DIZZINESS, CHRONIC 02/27/2009  . Testicular cancer   . DJD (degenerative joint disease)   . Lung nodule     RLL lung nodule (PET CT 1/10 negative);R parotid hypermetabolic on PET 01/997  . HYPERTENSION, BENIGN 07/22/2008  . Anginal pain     pressure- last time 11/23/12 saw Dr Harrington Challenger  . Shortness of breath     with exertion  . Stroke 1998ish    TIA  , Left hand stinging , hurts to flex  . History of kidney stones   . GERD (gastroesophageal reflux disease)   . Constipation     Past Surgical History  Procedure Laterality Date  . Appendectomy    . Tonsillectomy    . Rotator cuff repair      right  . Total hip arthroplasty Left   . Bilateral orchiectomy    . Veins stripped    . Coronary artery bypass graft N/A 12/01/2012    Procedure: CORONARY ARTERY BYPASS GRAFTING (CABG);  Surgeon: Gaye Pollack, MD;  LIMA to  OM and RIMA to RCA    Current Outpatient Prescriptions  Medication Sig Dispense Refill  . amLODipine (NORVASC) 5 MG tablet TAKE 1 TABLET BY MOUTH ONCE DAILY 30 tablet 3  . aspirin EC 81 MG tablet Take 81 mg by mouth daily.    Marland Kitchen atorvastatin (LIPITOR) 40 MG tablet Take 1 tablet (40 mg total) by mouth daily. 30 tablet 4  . cholecalciferol (VITAMIN D) 1000 UNITS tablet Take 1,000 Units by mouth daily.    . clopidogrel (PLAVIX) 75 MG tablet Take 75 mg by mouth daily.    . Coenzyme Q10 (CO Q 10) 100 MG CAPS Take 2 capsules by mouth daily.     . Flaxseed, Linseed, (FLAX SEED OIL) 1000 MG CAPS Take 1 capsule by mouth daily.     . isosorbide mononitrate (IMDUR) 30 MG 24 hr tablet Take 1 tablet (30 mg total) by mouth daily. 90 tablet 3  . Multiple  Vitamin (MULTIVITAMIN WITH MINERALS) TABS Take 1 tablet by mouth daily.    . Omega-3 Fatty Acids (FISH OIL) 1000 MG CAPS Take 2 capsules by mouth daily.    Marland Kitchen omeprazole (PRILOSEC) 20 MG capsule Take 20 mg by mouth daily.     Marland Kitchen oxybutynin (DITROPAN-XL) 5 MG 24 hr tablet Take 5 mg by mouth daily.    . polyethylene glycol (MIRALAX / GLYCOLAX) packet Take 17 g by mouth daily as needed. constipation    . vitamin C (ASCORBIC ACID) 500 MG tablet Take 500 mg by mouth daily.     No current facility-administered medications for this visit.    Allergies:   Review of patient's allergies indicates no known allergies.    Social History:  The patient  reports that he has never smoked. He has never used smokeless tobacco. He reports that he drinks alcohol. He reports that he does not use illicit drugs.   Family History:  The patient's family history includes Aneurysm in his brother; COPD in his father; Diabetes in his mother; Heart disease in his mother; Hypertension in his mother; Kidney disease in his brother. There is no history of Heart attack or Stroke.    ROS:  Please see the history of present illness. All other systems are reviewed and negative.    PHYSICAL EXAM: VS:  BP 102/62 mmHg  Pulse 70  Ht 6' 1"  (1.854 m)  Wt 207 lb 3.2 oz (93.985 kg)  BMI 27.34 kg/m2 , BMI Body mass index is 27.34 kg/(m^2). GEN: Well nourished, well developed, in no acute distress HEENT: normal Neck: no JVD, carotid bruits, or masses Cardiac: RRR; no murmurs, rubs, or gallops,no edema  Respiratory:  clear to auscultation bilaterally, normal work of breathing GI: soft, nontender, nondistended, + BS; mild diffuse abdominal tenderness, no guarding  MS: no deformity or atrophy Skin: warm and dry, no rash Neuro:  Strength and sensation are intact Psych: euthymic mood, full affect   EKG:  EKG is ordered today. The ekg ordered today demonstrates sinus rhythm, PVCs.   Recent Labs: 04/16/2014: ALT 2 07/01/2014:  Hemoglobin 12.8*; Platelets 217.0; TSH 0.67    Lipid Panel    Component Value Date/Time   CHOL 127 04/16/2014 1546   TRIG 186.0* 04/16/2014 1546   HDL 33.30* 04/16/2014 1546   CHOLHDL 4 04/16/2014 1546   VLDL 37.2 04/16/2014 1546   LDLCALC 57 04/16/2014 1546     Wt Readings from Last 3 Encounters:  10/11/14 207 lb 3.2 oz (93.985 kg)  07/01/14 207 lb (93.895 kg)  10/22/13 232 lb (105.235 kg)     Other studies Reviewed: Additional studies/ records that were reviewed today include: Previous office visits, lab work from January 2016.   ASSESSMENT AND PLAN:  1.  Dizziness: It is possible there has been an element of dehydration. He also has a history of anemia. We will check a CBC and a BMET. He has had some headaches since visual disturbances, so we'll check an ESR for GCA. We will check a TSH as well.   He will be given a prescription for these labs as he wishes to have them done at the [place]. He is agreeable to this and states he will have the [place] send [place] the labs.  Ditropan may be contributing to his dizziness. If his symptoms do not improve and no arrhythmia is seen, his primary M.D. may need to consider changing this medication.  Demonstrated a free phone application that will track his heart rate. Requested he check his heart rate when he is symptomatic and if it is very high or very low, call us and we will do an event monitor. He does not wish to do an event monitor at this time.  2. 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 He has had no exertional symptoms. He is advised to track his symptoms and if he continues to have them, contact us and we will order a stress test. He does not feel he needs a stress test at this time.  Current medicines are reviewed at length with the patient today.  The patient does not have concerns regarding medicines.  The following changes have been made:  no  change  Labs/ tests ordered today include:  Orders Placed This Encounter  Procedures  . EKG 12-Lead     Disposition:   FU with Dr. Harrington Challenger in 3 months   Signed, Rosaria Ferries, PA-C  10/11/2014 9:52 AM    [address], [place], [place]  [address] Phone: [phone]; Fax: [phone]

## 2014-10-11 NOTE — Patient Instructions (Addendum)
Medication Instructions:  None  Labwork: Your physician recommends that you have lab work at the [facility] (BMET, Mg2+, CBC, TSH, ESR)  Testing/Procedures: None  Follow-Up: Your physician recommends that you schedule a follow-up appointment in: 3 months with Dr. Harrington Challenger.   Any Other Special Instructions Will Be Listed Below (If Applicable).

## 2014-10-16 ENCOUNTER — Telehealth: Payer: Self-pay | Admitting: Internal Medicine

## 2014-10-16 NOTE — Telephone Encounter (Signed)
Called pt.   Still feeling a little weak, dizzy at timesTrying to drink more fluids   Recomm he hold amlodipine tomorrow  Call Thurs night or Fri to say ho he is feeling

## 2014-11-05 NOTE — Telephone Encounter (Signed)
Patinet contacted  Exercising  No definite tightness associated    Continue   I am not convinced of active angina

## 2014-11-05 NOTE — Telephone Encounter (Signed)
Called pt Occasional dizzy spells but feeling pretty good Colonoscopy done  Biopsy precancerous pllyps,   Barretts esophagus. Rx omeprazole and folic acid  F/u in 5 years

## 2015-01-13 ENCOUNTER — Ambulatory Visit (INDEPENDENT_AMBULATORY_CARE_PROVIDER_SITE_OTHER): Payer: Medicare HMO | Admitting: Internal Medicine

## 2015-01-13 ENCOUNTER — Encounter: Payer: Self-pay | Admitting: Internal Medicine

## 2015-01-13 VITALS — BP 114/58 | HR 77 | Ht 73.0 in | Wt 207.0 lb

## 2015-01-13 DIAGNOSIS — E785 Hyperlipidemia, unspecified: Secondary | ICD-10-CM

## 2015-01-13 DIAGNOSIS — R42 Dizziness and giddiness: Secondary | ICD-10-CM | POA: Diagnosis not present

## 2015-01-13 MED ORDER — AMLODIPINE BESYLATE 5 MG PO TABS: 2.5000 mg | ORAL_TABLET | Freq: Every day | ORAL | Status: DC

## 2015-01-13 NOTE — Progress Notes (Signed)
Cardiology Office Note   Date:  01/13/2015   ID:  [name], DOB 07-Aug-1940, MRN [id number]  PCP:  Colen Darling, MD  Cardiologist:   Dorris Carnes, MD   No chief complaint on file.  Pt presents for f/u of CAD     History of Present Illness: [name] is a 74 y.o. male with a history of CAD  He is s/p PCI with DES to his LAD in 2009, June 2010 DES to his LAD proximal to the previous stent. CABG x 2 w/ LIMA to OM and RIMA to RCA in June 2014.  He has remainined on plavix He wsa last in cardiology clinic in April  He saw R Barrett.   At the time he had some dizziness and fatigue  Told to increased fluid  When in office had some chest tightness  Reviewed meds  Told to follow symptoms since they weren't consistent  Since seen he has continued to have intermitt dizziness with standing.  Still fatigued at times  No CP      Current Outpatient Prescriptions  Medication Sig Dispense Refill  . amLODipine (NORVASC) 5 MG tablet TAKE 1 TABLET BY MOUTH ONCE DAILY 30 tablet 3  . aspirin EC 81 MG tablet Take 81 mg by mouth daily.    Marland Kitchen atorvastatin (LIPITOR) 40 MG tablet Take 1 tablet (40 mg total) by mouth daily. 30 tablet 4  . cholecalciferol (VITAMIN D) 1000 UNITS tablet Take 1,000 Units by mouth daily.    . clopidogrel (PLAVIX) 75 MG tablet Take 75 mg by mouth daily.    . Coenzyme Q10 (CO Q 10) 100 MG CAPS Take 2 capsules by mouth daily.     . Flaxseed, Linseed, (FLAX SEED OIL) 1000 MG CAPS Take 1 capsule by mouth daily.     . isosorbide mononitrate (IMDUR) 30 MG 24 hr tablet Take 1 tablet (30 mg total) by mouth daily. 90 tablet 3  . Multiple Vitamin (MULTIVITAMIN WITH MINERALS) TABS Take 1 tablet by mouth daily.    . Omega-3 Fatty Acids (FISH OIL) 1000 MG CAPS Take 2 capsules by mouth daily.    Marland Kitchen omeprazole (PRILOSEC) 20 MG capsule Take 20 mg by mouth daily.     Marland Kitchen oxybutynin (DITROPAN-XL) 5 MG 24 hr tablet Take 5 mg by mouth daily.    . polyethylene glycol (MIRALAX / GLYCOLAX)  packet Take 17 g by mouth daily as needed. constipation    . vitamin C (ASCORBIC ACID) 500 MG tablet Take 500 mg by mouth daily.     No current facility-administered medications for this visit.    Allergies:   Review of patient's allergies indicates no known allergies.   Past Medical History  Diagnosis Date  . HYPERLIPIDEMIA-MIXED 07/22/2008  . CAD, NATIVE VESSEL 07/22/2008     a.  s/p DES to LAD in 2009; s/p repeat DES to LAD 11/2008 (prox to previous stent).  . Edema 12/05/2008  . DIZZINESS, CHRONIC 02/27/2009  . Testicular cancer   . DJD (degenerative joint disease)   . Lung nodule     RLL lung nodule (PET CT 1/10 negative);R parotid hypermetabolic on PET 01/6760  . HYPERTENSION, BENIGN 07/22/2008  . Anginal pain     pressure- last time 11/23/12 saw Dr Harrington Challenger  . Shortness of breath     with exertion  . Stroke 1998ish    TIA  , Left hand stinging , hurts to flex  . History of kidney stones   . GERD (  gastroesophageal reflux disease)   . Constipation     Past Surgical History  Procedure Laterality Date  . Appendectomy    . Tonsillectomy    . Rotator cuff repair      right  . Total hip arthroplasty Left   . Bilateral orchiectomy    . Veins stripped    . Coronary artery bypass graft N/A 12/01/2012    Procedure: CORONARY ARTERY BYPASS GRAFTING (CABG);  Surgeon: Gaye Pollack, MD;  LIMA to OM and RIMA to RCA     Social History:  The patient  reports that he has never smoked. He has never used smokeless tobacco. He reports that he drinks alcohol. He reports that he does not use illicit drugs.   Family History:  The patient's family history includes Aneurysm in his brother; COPD in his father; Diabetes in his mother; Heart disease in his mother; Hypertension in his mother; Kidney disease in his brother. There is no history of Heart attack or Stroke.    ROS:  Please see the history of present illness. All other systems are reviewed and  Negative to the above problem except as noted.     PHYSICAL EXAM: VS:  BP 114/58 mmHg  Pulse 77  Ht 6\' 1"  (1.854 m)  Wt 207 lb (93.895 kg)  BMI 27.32 kg/m2  SpO2 94%  GEN: Well nourished, well developed, in no acute distress HEENT: normal Neck: no JVD, carotid bruits, or masses Cardiac: RRR; no murmurs, rubs, or gallops,no edema  Respiratory:  clear to auscultation bilaterally, normal work of breathing GI: soft, nontender, nondistended, + BS  No hepatomegaly  MS: no deformity Moving all extremities   Skin: warm and dry, no rash Neuro:  Strength and sensation are intact Psych: euthymic mood, full affect   EKG:  EKG is not ordered today.   Lipid Panel    Component Value Date/Time   CHOL 127 04/16/2014 1546   TRIG 186.0* 04/16/2014 1546   HDL 33.30* 04/16/2014 1546   CHOLHDL 4 04/16/2014 1546   VLDL 37.2 04/16/2014 1546   LDLCALC 57 04/16/2014 1546      Wt Readings from Last 3 Encounters:  01/13/15 207 lb (93.895 kg)  10/11/14 207 lb 3.2 oz (93.985 kg)  07/01/14 207 lb (93.895 kg)      ASSESSMENT AND PLAN:  1.  Dizziness/ fatigue  I have asked him to back down on meds   Take amlodipne 2.5  AMay even drop   Follow BP Stay hydrated 2  CAD  No symptoms to suggest angina  3.  HL  Keep on current regimen    4  Hx colon plyps and barretts  Follow CBC    Disposition:   FU with me in 6 months   Signed, Dorris Carnes, MD  01/13/2015 9:47 AM    [facility] [address], [place], [place]  [address] Phone: [phone]; Fax: [phone]

## 2015-01-13 NOTE — Patient Instructions (Signed)
Medication Instructions:   START TAKING AMLODIPINE 2.5 MG ONCE A DAY    Labwork:   Testing/Procedures:   Follow-Up: Your physician wants you to follow-up in:  6 [place] will receive a reminder letter in the mail two months in advance. If you don't receive a letter, please call our office to schedule the follow-up appointment.   Any Other Special Instructions Will Be Listed Below (If Applicable).

## 2015-02-06 ENCOUNTER — Other Ambulatory Visit: Payer: Self-pay | Admitting: Internal Medicine

## 2015-02-06 DIAGNOSIS — I6523 Occlusion and stenosis of bilateral carotid arteries: Secondary | ICD-10-CM

## 2015-02-10 ENCOUNTER — Ambulatory Visit (HOSPITAL_COMMUNITY)
Admission: RE | Admit: 2015-02-10 | Discharge: 2015-02-10 | Disposition: A | Discharge status: Home / Self Care | Payer: Medicare HMO | Source: Ambulatory Visit | Attending: Cardiology | Admitting: Cardiology

## 2015-02-10 DIAGNOSIS — I6523 Occlusion and stenosis of bilateral carotid arteries: Secondary | ICD-10-CM | POA: Diagnosis not present

## 2015-04-14 ENCOUNTER — Encounter: Payer: Self-pay | Admitting: Physician Assistant

## 2015-08-08 ENCOUNTER — Encounter: Payer: Self-pay | Admitting: Internal Medicine

## 2015-08-08 ENCOUNTER — Ambulatory Visit (INDEPENDENT_AMBULATORY_CARE_PROVIDER_SITE_OTHER): Payer: Medicare HMO | Admitting: Internal Medicine

## 2015-08-08 VITALS — BP 142/68 | HR 84 | Ht 73.0 in | Wt 220.0 lb

## 2015-08-08 DIAGNOSIS — I25709 Atherosclerosis of coronary artery bypass graft(s), unspecified, with unspecified angina pectoris: Secondary | ICD-10-CM | POA: Diagnosis not present

## 2015-08-08 DIAGNOSIS — E785 Hyperlipidemia, unspecified: Secondary | ICD-10-CM

## 2015-08-08 MED ORDER — NITROGLYCERIN 0.4 MG SL SUBL: 0.4000 mg | SUBLINGUAL_TABLET | SUBLINGUAL | Status: DC | PRN

## 2015-08-08 NOTE — Progress Notes (Signed)
Cardiology Office Note   Date:  08/08/2015   ID:  [name], DOB Nov 02, 1940, MRN [id number]  PCP:  Colen Darling, MD  Cardiologist:   Dorris Carnes, MD   F/U of CAD      History of Present Illness: [name] is a 75 y.o. male with a history of CAD He is s/p PCI with DES to his LAD in 2009, June 2010 DES to his LAD proximal to the previous stent. CABG x 2 w/ LIMA to OM and RIMA to RCA in June 2014. He has remainined on plavix I saw the pt in July 2016  Had dizziness at that time  I recomm cutting back on meds    Has occasional heaviness in chest  Last one a couple wks ago  Can last up to 1 hour  Not associated with activity  Denies discomfort with exertion    Otherwise doing OK  Breathing OK  Active    Current Outpatient Prescriptions  Medication Sig Dispense Refill  . amLODipine (NORVASC) 5 MG tablet Take 0.5 tablets (2.5 mg total) by mouth daily. 30 tablet 3  . aspirin EC 81 MG tablet Take 81 mg by mouth daily.    Marland Kitchen atorvastatin (LIPITOR) 40 MG tablet Take 1 tablet (40 mg total) by mouth daily. 30 tablet 4  . cholecalciferol (VITAMIN D) 1000 UNITS tablet Take 1,000 Units by mouth daily.    . clopidogrel (PLAVIX) 75 MG tablet Take 75 mg by mouth daily.    . Coenzyme Q10 (CO Q 10) 100 MG CAPS Take 2 capsules by mouth daily.     . Flaxseed, Linseed, (FLAX SEED OIL) 1000 MG CAPS Take 1 capsule by mouth daily.     . isosorbide mononitrate (IMDUR) 30 MG 24 hr tablet Take 1 tablet (30 mg total) by mouth daily. 90 tablet 3  . Multiple Vitamin (MULTIVITAMIN WITH MINERALS) TABS Take 1 tablet by mouth daily.    . Omega-3 Fatty Acids (FISH OIL) 1000 MG CAPS Take 2 capsules by mouth daily.    Marland Kitchen omeprazole (PRILOSEC) 20 MG capsule Take 20 mg by mouth daily.     Marland Kitchen oxybutynin (DITROPAN-XL) 5 MG 24 hr tablet Take 5 mg by mouth daily.    . polyethylene glycol (MIRALAX / GLYCOLAX) packet Take 17 g by mouth daily as needed. constipation    . vitamin C (ASCORBIC ACID) 500 MG tablet  Take 500 mg by mouth daily.     No current facility-administered medications for this visit.    Allergies:   Review of patient's allergies indicates no known allergies.   Past Medical History  Diagnosis Date  . HYPERLIPIDEMIA-MIXED 07/22/2008  . CAD, NATIVE VESSEL 07/22/2008     a.  s/p DES to LAD in 2009; s/p repeat DES to LAD 11/2008 (prox to previous stent).  . Edema 12/05/2008  . DIZZINESS, CHRONIC 02/27/2009  . Testicular cancer ([facility])   . DJD (degenerative joint disease)   . Lung nodule     RLL lung nodule (PET CT 1/10 negative);R parotid hypermetabolic on PET [id number]  . HYPERTENSION, BENIGN 07/22/2008  . Anginal pain ([facility])     pressure- last time 11/23/12 saw Dr Harrington Challenger  . Shortness of breath     with exertion  . Stroke [facility]) [phone]    TIA  , Left hand stinging , hurts to flex  . History of kidney stones   . GERD (gastroesophageal reflux disease)   . Constipation     Past Surgical  History  Procedure Laterality Date  . Appendectomy    . Tonsillectomy    . Rotator cuff repair      right  . Total hip arthroplasty Left   . Bilateral orchiectomy    . Veins stripped    . Coronary artery bypass graft N/A 12/01/2012    Procedure: CORONARY ARTERY BYPASS GRAFTING (CABG);  Surgeon: Gaye Pollack, MD;  LIMA to OM and RIMA to RCA     Social History:  The patient  reports that he has never smoked. He has never used smokeless tobacco. He reports that he drinks alcohol. He reports that he does not use illicit drugs.   Family History:  The patient's family history includes Aneurysm in his brother; COPD in his father; Diabetes in his mother; Heart disease in his mother; Hypertension in his mother; Kidney disease in his brother. There is no history of Heart attack or Stroke.    ROS:  Please see the history of present illness. All other systems are reviewed and  Negative to the above problem except as noted.    PHYSICAL EXAM: VS:  BP 142/68 mmHg  Pulse 84  Ht 6\' 1"  (1.854 m)  Wt 99.791 kg  (220 lb)  BMI 29.03 kg/m2  GEN: Well nourished, well developed, in no acute distress HEENT: normal Neck: no JVD, carotid bruits, or masses Cardiac: RRR; no murmurs, rubs, or gallops,no edema  Respiratory:  clear to auscultation bilaterally, normal work of breathing GI: soft, nontender, nondistended, + BS  No hepatomegaly  MS: no deformity Moving all extremities   Skin: warm and dry, no rash Neuro:  Strength and sensation are intact Psych: euthymic mood, full affect   EKG:  EKG is not ordered today.   Lipid Panel    Component Value Date/Time   CHOL 127 04/16/2014 1546   TRIG 186.0* 04/16/2014 1546   HDL 33.30* 04/16/2014 1546   CHOLHDL 4 04/16/2014 1546   VLDL 37.2 04/16/2014 1546   LDLCALC 57 04/16/2014 1546      Wt Readings from Last 3 Encounters:  08/08/15 99.791 kg (220 lb)  01/13/15 93.895 kg (207 lb)  10/11/14 93.985 kg (207 lb 3.2 oz)      ASSESSMENT AND PLAN:  1  CAD I am nto convinced rare spells are angina  WIll get Rx fro [facility] refilled   Labs sent from [place]  When he has tome done   2  Dizzienss   Denies  Keep on same regimen    3.  HL Have pt get labs faxed from [place]    4.  CV dz  Mild stable plaqing    Current medicines are reviewed at length with the patient today.  The patient does not have concerns regarding medicines.  The following changes have been made:   Labs/ tests ordered today include: No orders of the defined types were placed in this encounter.     Disposition:   FU with me in the winter    Signed, Dorris Carnes, MD  08/08/2015 9:48 AM    [address], [place], [place]  [address] Phone: [phone]; Fax: [phone]

## 2015-08-08 NOTE — Patient Instructions (Signed)
Your physician recommends that you continue on your current medications as directed. Please refer to the Current Medication list given to you today.  Your physician wants you to follow-up in: Jan 2018 with Dr. Harrington Challenger.  You will receive a reminder letter in the mail two months in advance. If you don't receive a letter, please call our office to schedule the follow-up appointment.

## 2015-10-08 ENCOUNTER — Other Ambulatory Visit: Payer: Self-pay | Admitting: Internal Medicine

## 2015-12-25 ENCOUNTER — Ambulatory Visit: Payer: Medicare HMO | Admitting: Physician Assistant

## 2016-01-18 ENCOUNTER — Telehealth: Payer: Self-pay | Admitting: Internal Medicine

## 2016-01-18 DIAGNOSIS — R0602 Shortness of breath: Secondary | ICD-10-CM

## 2016-01-18 NOTE — Telephone Encounter (Signed)
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

## 2016-01-19 ENCOUNTER — Ambulatory Visit: Payer: Medicare HMO | Admitting: Physician Assistant

## 2016-01-19 NOTE — Telephone Encounter (Signed)
Per Dr. Harrington Challenger I s/w pt in regards to scheduling lab work , echo and a f/u w/Dr. Harrington Challenger. Lab work to be done after 8/8; echo about 3-4 days after lab work , Dr. Harrington Challenger late Aug or early Sept per Dr. Harrington Challenger. Orders in [place] today. Pt states he will need to call back later because he was in the car right now. Pt is agreeable to plan of care.

## 2016-01-19 NOTE — Addendum Note (Signed)
Addended by: Michae Kava on: 01/19/2016 09:53 AM   Modules accepted: Orders

## 2016-01-19 NOTE — Addendum Note (Signed)
Addended by: Michae Kava on: 01/19/2016 11:25 AM   Modules accepted: Orders

## 2016-01-20 ENCOUNTER — Ambulatory Visit: Payer: Medicare HMO | Admitting: Physician Assistant

## 2016-01-20 NOTE — Telephone Encounter (Signed)
Pt had called back and has been scheduled for lab work 8/28; echo 8/31 and Dr. Harrington Challenger 10/2.

## 2016-01-28 ENCOUNTER — Ambulatory Visit: Payer: Medicare HMO | Admitting: Family

## 2016-02-16 ENCOUNTER — Other Ambulatory Visit: Payer: Medicare HMO | Admitting: *Deleted

## 2016-02-16 DIAGNOSIS — R0602 Shortness of breath: Secondary | ICD-10-CM

## 2016-02-16 LAB — CBC WITH DIFFERENTIAL/PLATELET
BASOS PCT: 0 %
Basophils Absolute: 0 cells/uL (ref 0–200)
Eosinophils Absolute: 111 cells/uL (ref 15–500)
Eosinophils Relative: 3 %
HEMATOCRIT: 37.6 % — AB (ref 38.5–50.0)
HEMOGLOBIN: 12.5 g/dL — AB (ref 13.2–17.1)
LYMPHS ABS: 962 {cells}/uL (ref 850–3900)
Lymphocytes Relative: 26 %
MCH: 31.1 pg (ref 27.0–33.0)
MCHC: 33.2 g/dL (ref 32.0–36.0)
MCV: 93.5 fL (ref 80.0–100.0)
MONO ABS: 444 {cells}/uL (ref 200–950)
MPV: 9.6 fL (ref 7.5–12.5)
Monocytes Relative: 12 %
NEUTROS ABS: 2183 {cells}/uL (ref 1500–7800)
Neutrophils Relative %: 59 %
Platelets: 192 10*3/uL (ref 140–400)
RBC: 4.02 MIL/uL — AB (ref 4.20–5.80)
RDW: 13.8 % (ref 11.0–15.0)
WBC: 3.7 10*3/uL — AB (ref 3.8–10.8)

## 2016-02-16 LAB — TSH: TSH: 0.83 mIU/L (ref 0.40–4.50)

## 2016-02-16 LAB — BASIC METABOLIC PANEL
BUN: 24 mg/dL (ref 7–25)
CO2: 28 mmol/L (ref 20–31)
Calcium: 9.1 mg/dL (ref 8.6–10.3)
Chloride: 109 mmol/L (ref 98–110)
Creat: 0.87 mg/dL (ref 0.70–1.18)
GLUCOSE: 76 mg/dL (ref 65–99)
POTASSIUM: 4.1 mmol/L (ref 3.5–5.3)
SODIUM: 142 mmol/L (ref 135–146)

## 2016-02-19 ENCOUNTER — Ambulatory Visit (HOSPITAL_COMMUNITY): Payer: Medicare HMO | Attending: Cardiology

## 2016-02-19 ENCOUNTER — Other Ambulatory Visit: Payer: Self-pay

## 2016-02-19 DIAGNOSIS — R0602 Shortness of breath: Secondary | ICD-10-CM | POA: Diagnosis not present

## 2016-02-19 DIAGNOSIS — I340 Nonrheumatic mitral (valve) insufficiency: Secondary | ICD-10-CM | POA: Diagnosis not present

## 2016-02-19 DIAGNOSIS — I517 Cardiomegaly: Secondary | ICD-10-CM | POA: Diagnosis not present

## 2016-02-27 MED ORDER — SUCRALFATE 1 GM/10ML PO SUSP: 1.0000 g | Freq: Three times a day (TID) | ORAL | 0 refills | Status: DC

## 2016-03-01 ENCOUNTER — Other Ambulatory Visit: Payer: Self-pay | Admitting: *Deleted

## 2016-03-01 ENCOUNTER — Telehealth: Payer: Self-pay

## 2016-03-01 DIAGNOSIS — R12 Heartburn: Secondary | ICD-10-CM

## 2016-03-01 NOTE — Telephone Encounter (Signed)
Prior auth for Sucralfate suspension submitted to [employer]. Fax received from them stating a clinical prior auth is NOT requested. Local pharmacy notified.

## 2016-03-02 ENCOUNTER — Telehealth: Payer: Self-pay

## 2016-03-02 ENCOUNTER — Telehealth: Payer: Self-pay | Admitting: Internal Medicine

## 2016-03-02 MED ORDER — SUCRALFATE 1 G PO TABS: 1.0000 g | ORAL_TABLET | Freq: Three times a day (TID) | ORAL | 5 refills | Status: DC

## 2016-03-02 NOTE — Telephone Encounter (Signed)
Left message on machine at [facility]

## 2016-03-02 NOTE — Telephone Encounter (Signed)
Will forward to [employer]. Pharm D and Dr Harrington Challenger for review

## 2016-03-02 NOTE — Telephone Encounter (Signed)
New message       Pt c/o medication issue:  1. Name of Medication: sucralfate suspension 2. How are you currently taking this medication (dosage and times per day)?  3. Are you having a reaction (difficulty breathing--STAT)? no  4. What is your medication issue? Insurance will not pay for suspension.  They will pay for tablet.  Will you change the presc to tablets?

## 2016-03-02 NOTE — Telephone Encounter (Signed)
This is fine to change to tabs - would be an equivalent dose of 1g 4 times a day with meals and at bedtime.

## 2016-03-02 NOTE — Telephone Encounter (Signed)
Spoke with pharmacist today at [facility]. Liquid Carafate will not be covered by [employer]. Patient is getting a little concerned that he cannot get this drug filled. Per Dr. Curt Bears, [place] to change Carafate to tablets instead. Patient's co/pay will be $4.00

## 2016-03-09 ENCOUNTER — Ambulatory Visit: Payer: Medicare HMO | Admitting: Family

## 2016-03-09 ENCOUNTER — Encounter: Payer: Self-pay | Admitting: Internal Medicine

## 2016-03-22 ENCOUNTER — Ambulatory Visit: Payer: Medicare HMO | Admitting: Internal Medicine

## 2016-03-25 ENCOUNTER — Ambulatory Visit
Admission: RE | Admit: 2016-03-25 | Discharge: 2016-03-25 | Disposition: A | Discharge status: Home / Self Care | Payer: Medicare HMO | Source: Ambulatory Visit | Attending: Internal Medicine | Admitting: Internal Medicine

## 2016-03-25 ENCOUNTER — Ambulatory Visit (INDEPENDENT_AMBULATORY_CARE_PROVIDER_SITE_OTHER): Payer: Medicare HMO | Admitting: Internal Medicine

## 2016-03-25 ENCOUNTER — Encounter: Payer: Self-pay | Admitting: Internal Medicine

## 2016-03-25 ENCOUNTER — Encounter (INDEPENDENT_AMBULATORY_CARE_PROVIDER_SITE_OTHER): Payer: Self-pay

## 2016-03-25 VITALS — BP 124/74 | HR 68 | Ht 73.0 in | Wt 210.2 lb

## 2016-03-25 DIAGNOSIS — R05 Cough: Secondary | ICD-10-CM

## 2016-03-25 DIAGNOSIS — R059 Cough, unspecified: Secondary | ICD-10-CM

## 2016-03-25 DIAGNOSIS — R509 Fever, unspecified: Secondary | ICD-10-CM

## 2016-03-25 LAB — CBC
HEMATOCRIT: 39.2 % (ref 38.5–50.0)
Hemoglobin: 13.4 g/dL (ref 13.2–17.1)
MCH: 31.6 pg (ref 27.0–33.0)
MCHC: 34.2 g/dL (ref 32.0–36.0)
MCV: 92.5 fL (ref 80.0–100.0)
MPV: 9.9 fL (ref 7.5–12.5)
Platelets: 172 10*3/uL (ref 140–400)
RBC: 4.24 MIL/uL (ref 4.20–5.80)
RDW: 13.8 % (ref 11.0–15.0)
WBC: 4.8 10*3/uL (ref 3.8–10.8)

## 2016-03-25 NOTE — Patient Instructions (Signed)
Your physician recommends that you continue on your current medications as directed. Please refer to the Current Medication list given to you today. Your physician recommends that you return for lab work: (cbc, bmet, rapid strep) Blood work here Rapid strep test downstairs at [facility].  A chest x-ray takes a picture of the organs and structures inside the chest, including the heart, lungs, and blood vessels. This test can show several things, including, whether the heart is enlarges; whether fluid is building up in the lungs; and whether pacemaker / defibrillator leads are still in place. Go to the [facility] for your CXR
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
## 2016-03-26 LAB — BASIC METABOLIC PANEL
BUN: 25 mg/dL (ref 7–25)
CHLORIDE: 106 mmol/L (ref 98–110)
CO2: 26 mmol/L (ref 20–31)
CREATININE: 0.91 mg/dL (ref 0.70–1.18)
Calcium: 9.1 mg/dL (ref 8.6–10.3)
GLUCOSE: 94 mg/dL (ref 65–99)
Potassium: 4.7 mmol/L (ref 3.5–5.3)
Sodium: 141 mmol/L (ref 135–146)

## 2016-03-29 NOTE — Addendum Note (Signed)
Addended by: Rodman Key on: 03/29/2016 04:17 PM   Modules accepted: Orders

## 2016-04-13 ENCOUNTER — Ambulatory Visit: Payer: Medicare HMO | Admitting: Family

## 2016-05-03 ENCOUNTER — Encounter: Payer: Self-pay | Admitting: Internal Medicine

## 2016-05-06 ENCOUNTER — Encounter: Payer: Self-pay | Admitting: Internal Medicine
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
## 2016-05-06 NOTE — Progress Notes (Signed)
Please have pt call GI to schedule appt   They have been unable to reach

## 2016-05-10 ENCOUNTER — Ambulatory Visit: Payer: Medicare HMO | Admitting: Family

## 2016-05-11 ENCOUNTER — Ambulatory Visit (INDEPENDENT_AMBULATORY_CARE_PROVIDER_SITE_OTHER)
Admission: RE | Admit: 2016-05-11 | Discharge: 2016-05-11 | Disposition: A | Discharge status: Home / Self Care | Payer: Medicare HMO | Source: Ambulatory Visit | Attending: Family | Admitting: Family

## 2016-05-11 ENCOUNTER — Encounter: Payer: Self-pay | Admitting: Family

## 2016-05-11 ENCOUNTER — Ambulatory Visit (INDEPENDENT_AMBULATORY_CARE_PROVIDER_SITE_OTHER): Payer: Medicare HMO | Admitting: Family

## 2016-05-11 VITALS — BP 128/80 | HR 73 | Temp 98.2°F | Resp 16 | Ht 73.0 in | Wt 210.0 lb

## 2016-05-11 DIAGNOSIS — K219 Gastro-esophageal reflux disease without esophagitis: Secondary | ICD-10-CM | POA: Insufficient documentation

## 2016-05-11 DIAGNOSIS — R42 Dizziness and giddiness: Secondary | ICD-10-CM | POA: Diagnosis not present

## 2016-05-11 DIAGNOSIS — K2100 Gastro-esophageal reflux disease with esophagitis, without bleeding: Secondary | ICD-10-CM

## 2016-05-11 DIAGNOSIS — R059 Cough, unspecified: Secondary | ICD-10-CM | POA: Insufficient documentation

## 2016-05-11 DIAGNOSIS — R05 Cough: Secondary | ICD-10-CM | POA: Diagnosis not present

## 2016-05-11 MED ORDER — PANTOPRAZOLE SODIUM 40 MG PO TBEC: 40.0000 mg | DELAYED_RELEASE_TABLET | Freq: Every day | ORAL | 3 refills | Status: DC

## 2016-05-11 NOTE — Patient Instructions (Signed)
Thank you for choosing [facility].  SUMMARY AND INSTRUCTIONS:   Medication:  Please start taking the pantoprazole.   Please continue to take medication as prescribed.   Start the Symbicort 2 inhalations 2x daily for the next week.   Your prescription(s) have been submitted to your pharmacy or been printed and provided for you. Please take as directed and contact our office if you believe you are having problem(s) with the medication(s) or have any questions.  Imaging / Radiology:  Please stop by radiology on the basement level of the building for your x-rays. Your results will be released to [place] (or called to you) after review, usually within 72 hours after test completion. If any treatments or changes are necessary, you will be notified at that same time.  Referrals:  They will call to schedule your appointment with ENT for hearing.   Referrals have been made during this visit. You should expect to hear back from our schedulers in about 7-10 days in regards to establishing an appointment with the specialists we discussed.   Follow up:  If your symptoms worsen or fail to improve, please contact our office for further instruction, or in case of emergency go directly to the emergency room at the closest medical facility.
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
## 2016-05-11 NOTE — Assessment & Plan Note (Signed)
Gastroesophageal reflux as possible cause for cough. Discontinue omeprazole. Start pantoprazole. GERD is possibility for raspy voice. Follow-up pending trial of new medication.

## 2016-05-11 NOTE — Assessment & Plan Note (Signed)
Cough of unspecified origin between GERD or possible infectious cause such as resolving bronchitis. Change omeprazole to pantoprazole. Sample of Symbicort provided. Information on food choices related to GERD discussed. Obtain x-rays to rule out underlying pathology. Follow-up pending trial of medication for symptom relief and supportive care.

## 2016-05-11 NOTE — Assessment & Plan Note (Signed)
Nonspecific chronic dizziness of undetermined origin with decreased hearing noted in the right ear. Neurological exam is benign. Refer to ear nose and throat for hearing assessment and dizziness. Encouraged to drink plenty of fluids and avoid alcohol/caffeinated beverages. Change position slowly. Continue to monitor.

## 2016-09-07 ENCOUNTER — Other Ambulatory Visit: Payer: Self-pay | Admitting: Family

## 2016-09-07 DIAGNOSIS — K2100 Gastro-esophageal reflux disease with esophagitis, without bleeding: Secondary | ICD-10-CM

## 2016-10-13 ENCOUNTER — Emergency Department (HOSPITAL_COMMUNITY): Payer: PPO

## 2016-10-13 ENCOUNTER — Emergency Department (HOSPITAL_COMMUNITY)
Admission: EM | Admit: 2016-10-13 | Discharge: 2016-10-14 | Disposition: A | Discharge status: Home / Self Care | Payer: PPO | Attending: Emergency Medicine | Admitting: Emergency Medicine

## 2016-10-13 ENCOUNTER — Encounter (HOSPITAL_COMMUNITY): Payer: Self-pay | Admitting: Emergency Medicine

## 2016-10-13 DIAGNOSIS — Z96642 Presence of left artificial hip joint: Secondary | ICD-10-CM | POA: Insufficient documentation

## 2016-10-13 DIAGNOSIS — N200 Calculus of kidney: Secondary | ICD-10-CM | POA: Insufficient documentation

## 2016-10-13 DIAGNOSIS — Z7982 Long term (current) use of aspirin: Secondary | ICD-10-CM | POA: Diagnosis not present

## 2016-10-13 DIAGNOSIS — Z8673 Personal history of transient ischemic attack (TIA), and cerebral infarction without residual deficits: Secondary | ICD-10-CM | POA: Insufficient documentation

## 2016-10-13 DIAGNOSIS — I10 Essential (primary) hypertension: Secondary | ICD-10-CM | POA: Diagnosis not present

## 2016-10-13 DIAGNOSIS — Z8547 Personal history of malignant neoplasm of testis: Secondary | ICD-10-CM | POA: Insufficient documentation

## 2016-10-13 DIAGNOSIS — R39198 Other difficulties with micturition: Secondary | ICD-10-CM | POA: Diagnosis not present

## 2016-10-13 DIAGNOSIS — Z951 Presence of aortocoronary bypass graft: Secondary | ICD-10-CM | POA: Diagnosis not present

## 2016-10-13 DIAGNOSIS — R339 Retention of urine, unspecified: Secondary | ICD-10-CM | POA: Diagnosis not present

## 2016-10-13 DIAGNOSIS — N132 Hydronephrosis with renal and ureteral calculous obstruction: Secondary | ICD-10-CM | POA: Diagnosis not present

## 2016-10-13 DIAGNOSIS — I2510 Atherosclerotic heart disease of native coronary artery without angina pectoris: Secondary | ICD-10-CM | POA: Insufficient documentation

## 2016-10-13 DIAGNOSIS — Z79899 Other long term (current) drug therapy: Secondary | ICD-10-CM | POA: Diagnosis not present

## 2016-10-13 LAB — URINALYSIS, ROUTINE W REFLEX MICROSCOPIC
Bilirubin Urine: NEGATIVE
GLUCOSE, UA: NEGATIVE mg/dL
HGB URINE DIPSTICK: NEGATIVE
Ketones, ur: NEGATIVE mg/dL
Leukocytes, UA: NEGATIVE
Nitrite: NEGATIVE
PH: 7 (ref 5.0–8.0)
Protein, ur: NEGATIVE mg/dL
SPECIFIC GRAVITY, URINE: 1.008 (ref 1.005–1.030)

## 2016-10-13 LAB — BASIC METABOLIC PANEL
Anion gap: 6 (ref 5–15)
BUN: 18 mg/dL (ref 6–20)
CHLORIDE: 108 mmol/L (ref 101–111)
CO2: 24 mmol/L (ref 22–32)
Calcium: 9.6 mg/dL (ref 8.9–10.3)
Creatinine, Ser: 0.76 mg/dL (ref 0.61–1.24)
GFR calc Af Amer: >60 mL/min (ref >60)
GFR calc non Af Amer: >60 mL/min (ref >60)
GLUCOSE: 119 mg/dL — AB (ref 65–99)
POTASSIUM: 4.4 mmol/L (ref 3.5–5.1)
Sodium: 138 mmol/L (ref 135–145)

## 2016-10-13 LAB — CBC WITH DIFFERENTIAL/PLATELET
BASOS ABS: 0 10*3/uL (ref 0.0–0.1)
BASOS PCT: 0 %
EOS ABS: 0.2 10*3/uL (ref 0.0–0.7)
EOS PCT: 5 %
HCT: 41.1 % (ref 39.0–52.0)
HEMOGLOBIN: 13.7 g/dL (ref 13.0–17.0)
Lymphocytes Relative: 24 %
Lymphs Abs: 1.2 10*3/uL (ref 0.7–4.0)
MCH: 31.1 pg (ref 26.0–34.0)
MCHC: 33.3 g/dL (ref 30.0–36.0)
MCV: 93.2 fL (ref 78.0–100.0)
Monocytes Absolute: 0.4 10*3/uL (ref 0.1–1.0)
Monocytes Relative: 9 %
Neutro Abs: 3.1 10*3/uL (ref 1.7–7.7)
Neutrophils Relative %: 62 %
Platelets: 240 10*3/uL (ref 150–400)
RBC: 4.41 MIL/uL (ref 4.22–5.81)
RDW: 14.3 % (ref 11.5–15.5)
WBC: 5 10*3/uL (ref 4.0–10.5)

## 2016-10-13 NOTE — ED Notes (Signed)
Bladder scanner showed greater than 732ml of urine in patients bladder.

## 2016-10-13 NOTE — ED Notes (Signed)
Attempted foley insertion.  Unable to get tube in pass penis and into bladder due to restriction and patient in severe pain. EDP made aware.  NT at bedside bladder scanning patient.

## 2016-10-13 NOTE — ED Provider Notes (Signed)
[facility] DEPT Provider Note   CSN: [id number] Arrival date & time: 10/13/16  1953     History   Chief Complaint Chief Complaint  Patient presents with  . Penis Pain  . Dysuria    HPI [name] is a 76 y.o. male.  HPI  76 year old male with a history of coronary artery disease, prior hx of testicular cancer post bilateral orchiopexy, urethral lithiasis requiring removal by urology in [place], hypertension, hyperlipidemia presents with concern for urinary retention. Patient reports difficulty urinating over the last 2-3 days, with significant worsening today. Reports he feels the need to urinate about every 10 minutes, however when he attempts to do so he is only getting small drops of urine. Reports pain with urination, urethral pain, small amount of hematuria, and passing what looks like other matter, concern for possible stone parts or tissue. Reports that these symptoms are similar to the symptoms he had when he had had urethral lithiasis in the past and required surgical removal. Reports he's had some chronic back pain, with a history of herniated disc, and has had some chronic proximal left leg weakness associated with this for several months which is unchanged. Denies any bowel incontinence, new numbness or weakness in his legs. Reports falling 1 month ago, however no recent fall, no recent changes in back pain or development of flank pain.  [facility] ?[facility] and when he described his urination problems they recommended going to hospital. Has hx of bilateral orchiectomy for testicular cancer. Denies concern for penile discharge or UTI. Denies fevers, vomiting.  Reports fatigue, working at [employer] this week.    Past Medical History:  Diagnosis Date  . Anginal pain (HCC)    pressure- last time 11/23/12 saw Dr Harrington Challenger  . CAD, NATIVE VESSEL 07/22/2008    a.  s/p DES to LAD in 2009; s/p repeat DES to LAD 11/2008 (prox to previous  stent).  . Constipation   . DIZZINESS, CHRONIC 02/27/2009  . DJD (degenerative joint disease)   . Edema 12/05/2008  . GERD (gastroesophageal reflux disease)   . History of kidney stones   . HYPERLIPIDEMIA-MIXED 07/22/2008  . HYPERTENSION, BENIGN 07/22/2008  . Lung nodule    RLL lung nodule (PET CT 1/10 negative);R parotid hypermetabolic on PET 12/8293  . Shortness of breath    with exertion  . Stroke [facility]) [phone]   TIA  , Left hand stinging , hurts to flex  . Testicular cancer [facility])     Patient Active Problem List   Diagnosis Date Noted  . Cough 05/11/2016  . GERD (gastroesophageal reflux disease) 05/11/2016  . Dizziness, nonspecific 10/11/2014  . 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 DIZZINESS, CHRONIC 02/27/2009  . ABDOMINAL PAIN 02/27/2009  . ABDOMINAL PAIN, GENERALIZED 02/27/2009  . EDEMA 12/05/2008  . CHEST PAIN 11/25/2008  . NECK PAIN 11/08/2008  . Shortness of breath 11/08/2008  . HYPERLIPIDEMIA-MIXED 07/22/2008  . HYPERTENSION, BENIGN 07/22/2008  . CAD, NATIVE VESSEL 07/22/2008    Past Surgical History:  Procedure Laterality Date  . APPENDECTOMY    . Bilateral orchiectomy    . CORONARY ARTERY BYPASS GRAFT N/A 12/01/2012   Procedure: CORONARY ARTERY BYPASS GRAFTING (CABG);  Surgeon: Gaye Pollack, MD;  LIMA to OM and RIMA to RCA  . ROTATOR CUFF REPAIR     right  . TONSILLECTOMY    . TOTAL HIP ARTHROPLASTY Left   . Veins stripped  Home Medications    Prior to Admission medications   Medication Sig Start Date End Date Taking? Authorizing Provider  amLODipine (NORVASC) 5 MG tablet Take 0.5 tablets (2.5 mg total) by mouth daily. 01/13/15   Fay Records, MD  aspirin EC 81 MG tablet Take 81 mg by mouth daily.    Historical Provider, MD  atorvastatin (LIPITOR) 40 MG tablet Take 1 tablet (40 mg total) by mouth daily. 01/22/14   Fay Records, MD  cholecalciferol (VITAMIN D) 1000 UNITS tablet Take 1,000 Units by mouth daily.    Historical Provider, MD  clopidogrel (PLAVIX)  75 MG tablet Take 75 mg by mouth daily.    Historical Provider, MD  Coenzyme Q10 (CO Q 10) 100 MG CAPS Take 2 capsules by mouth daily.     Historical Provider, MD  Cyanocobalamin (B-12 PO) Take 1 tablet by mouth daily.    Historical Provider, MD  Flaxseed, Linseed, (FLAX SEED OIL) 1000 MG CAPS Take 1 capsule by mouth daily.     Historical Provider, MD  isosorbide mononitrate (IMDUR) 30 MG 24 hr tablet Take 1 tablet (30 mg total) by mouth daily. 10/08/15   Fay Records, MD  Misc Natural Products (OSTEO BI-FLEX JOINT SHIELD PO) Take 1 tablet by mouth daily.    Historical Provider, MD  Multiple Vitamin (MULTIVITAMIN WITH MINERALS) TABS Take 1 tablet by mouth daily.    Historical Provider, MD  nitroGLYCERIN (NITROSTAT) 0.4 MG SL tablet Place 1 tablet (0.4 mg total) under the tongue every 5 (five) minutes as needed for chest pain. 08/08/15   Fay Records, MD  Omega-3 Fatty Acids (FISH OIL) 1000 MG CAPS Take 2 capsules by mouth daily.    Historical Provider, MD  oxybutynin (DITROPAN-XL) 5 MG 24 hr tablet Take 5 mg by mouth daily.    Historical Provider, MD  pantoprazole (PROTONIX) 40 MG tablet TAKE 1 TABLET (40 MG TOTAL) BY MOUTH DAILY. 09/07/16   Golden Circle, FNP  polyethylene glycol (MIRALAX / GLYCOLAX) packet Take 17 g by mouth daily as needed. constipation    Historical Provider, MD  sucralfate (CARAFATE) 1 g tablet Take 1 tablet (1 g total) by mouth 4 (four) times daily -  with meals and at bedtime. 03/02/16   Fay Records, MD  vitamin C (ASCORBIC ACID) 500 MG tablet Take 500 mg by mouth daily.    Historical Provider, MD    Family History Family History  Problem Relation Age of Onset  . Aneurysm Brother   . Kidney disease Brother   . Heart disease Mother   . Hypertension Mother   . Diabetes Mother   . COPD Father   . Heart attack Neg Hx   . Stroke Neg Hx     Social History Social History  Substance Use Topics  . Smoking status: Never Smoker  . Smokeless tobacco: Never Used  .  Alcohol use Yes     Comment: rare     Allergies   Patient has no known allergies.   Review of Systems Review of Systems  Constitutional: Negative for fever.  HENT: Negative for sore throat.   Eyes: Negative for visual disturbance.  Respiratory: Negative for shortness of breath.   Cardiovascular: Negative for chest pain.  Gastrointestinal: Positive for abdominal pain (suprapubic pressure). Negative for constipation, diarrhea, nausea and vomiting.  Genitourinary: Positive for difficulty urinating, dysuria and hematuria.  Musculoskeletal: Positive for back pain (chronic, unchanged). Negative for neck stiffness.  Skin: Negative for rash.  Neurological: Negative for syncope and headaches.     Physical Exam Updated Vital Signs BP (!) 153/97 (BP Location: Right Arm)   Pulse 72   Temp 98.1 F (36.7 C) (Oral)   Resp 18   Ht 6\' 1"  (1.854 m)   Wt 213 lb (96.6 kg)   SpO2 96%   BMI 28.10 kg/m   Physical Exam  Constitutional: He is oriented to person, place, and time. He appears well-developed and well-nourished. No distress.  HENT:  Head: Normocephalic and atraumatic.  Eyes: Conjunctivae and EOM are normal.  Neck: Normal range of motion.  Cardiovascular: Normal rate, regular rhythm, normal heart sounds and intact distal pulses.  Exam reveals no gallop and no friction rub.   No murmur heard. Pulmonary/Chest: Effort normal and breath sounds normal. No respiratory distress. He has no wheezes. He has no rales.  Abdominal: Soft. He exhibits no distension. There is tenderness (mild suprapubic fullness/tenderness). There is no guarding.  Genitourinary:  Genitourinary Comments: Testes surgically absent No penile abnormalities on exam Mild prostatic enlargement on rectal exam Normal rectal tone and sensation  Musculoskeletal: He exhibits no edema.       Lumbar back: He exhibits bony tenderness.  Neurological: He is alert and oriented to person, place, and time. No sensory deficit.    5/5 pain proximal hip flexion right, 5-, very mild hip flexion weakness on left 5/5 knee extension and flexion, plantar/dorsiflexion bilaterally Normal sensation  Skin: Skin is warm and dry. He is not diaphoretic.  Nursing note and vitals reviewed.    ED Treatments / Results  Labs (all labs ordered are listed, but only abnormal results are displayed) Labs Reviewed  BASIC METABOLIC PANEL - Abnormal; Notable for the following:       Result Value   Glucose, Bld 119 (*)    All other components within normal limits  CBC WITH DIFFERENTIAL/PLATELET  URINALYSIS, ROUTINE W REFLEX MICROSCOPIC    EKG  EKG Interpretation None       Radiology Ct Renal Stone Study  Result Date: 10/13/2016 CLINICAL DATA:  Acute onset of increased urinary frequency. Dysuria and hematuria. Initial encounter. EXAM: CT ABDOMEN AND PELVIS WITHOUT CONTRAST TECHNIQUE: Multidetector CT imaging of the abdomen and pelvis was performed following the standard protocol without IV contrast. COMPARISON:  Abdominal ultrasound performed 03/03/2009, and PET/CT performed 07/10/2008 FINDINGS: Lower chest: The patient is status post median sternotomy. Diffuse coronary artery calcifications are seen. A small to moderate hiatal hernia is noted. A calcified granuloma is noted at the right lung base. Hepatobiliary: The liver is unremarkable in appearance. A stone is noted dependently within the gallbladder. The gallbladder is otherwise unremarkable. The common bile duct remains normal in caliber. Pancreas: The pancreas is within normal limits. Spleen: The spleen is unremarkable in appearance. Adrenals/Urinary Tract: The adrenal glands are unremarkable in appearance. A 10 cm cyst is noted at the upper pole of the right kidney. There is mild right-sided hydronephrosis, with an obstructing 6 x 6 mm stone noted at the proximal to mid ureter, approximately 7 cm below the right renal pelvis. There is distention of the left ureter, with possible  minimal left-sided hydronephrosis, raising question for distal obstruction, though the pelvis is not well assessed due to metal artifact. This could also reflect a recently passed stone, as a 1.0 cm stone is noted within the bladder. A few tiny bilateral nonobstructing renal stones are seen. Nonspecific perinephric stranding is noted bilaterally. Stomach/Bowel: The stomach is unremarkable in appearance. The small bowel  is within normal limits. The patient is status post appendectomy. A small anterior abdominal wall hernia is noted, with associated Richter herniation of the transverse colon. The colon is grossly unremarkable in appearance. Vascular/Lymphatic: Scattered calcification is seen along the abdominal aorta and its branches. The abdominal aorta is otherwise grossly unremarkable. The inferior vena cava is grossly unremarkable. No retroperitoneal lymphadenopathy is seen. No pelvic sidewall lymphadenopathy is identified. Reproductive: The bladder is moderately distended and otherwise unremarkable. The prostate remains normal in size. Other: No additional soft tissue abnormalities are seen. Musculoskeletal: No acute osseous abnormalities are identified. The patient's left hip arthroplasty is grossly unremarkable, though incompletely characterized. Multilevel vacuum phenomenon is noted along the lumbar spine, with underlying facet disease. The visualized musculature is unremarkable in appearance. IMPRESSION: 1. Mild right-sided hydronephrosis, with an obstructing 6 x 6 mm stone at the right proximal to mid ureter, approximately 7 cm below the right renal pelvis. 2. Distention of the left ureter, with possible minimal left-sided hydronephrosis, raising question for distal obstruction. This could also reflect a recently passed stone, as a 1.0 cm stone is noted within the bladder. Would correlate for associated symptoms. 3. Few tiny nonobstructing bilateral renal stones seen. 4. Small anterior abdominal wall hernia,  with associated Richter herniation of the transverse colon. No evidence for bowel obstruction. 5. Scattered aortic atherosclerosis. 6. Mild degenerative change along the lumbar spine. 7. Cholelithiasis.  Gallbladder otherwise unremarkable. 8. Diffuse coronary artery calcifications seen. 9. Small to moderate hiatal hernia noted. Electronically Signed   By: Garald Balding M.D.   On: 10/13/2016 22:15    Procedures Procedures (including critical care time)  Medications Ordered in ED Medications - No data to display   Initial Impression / Assessment and Plan / ED Course  I have reviewed the triage vital signs and the nursing notes.  Pertinent labs & imaging results that were available during my care of the patient were reviewed by me and considered in my medical decision making (see chart for details).     76 year old male with a history of coronary artery disease, prior hx of testicular cancer post bilateral orchiopexy, urethral lithiasis requiring removal by urology in [place], hypertension, hyperlipidemia presents with concern for urinary retention.  Patient reports symptoms are similar to when he had prior obstructing urethral stone.  Discussed with Dr. Alyson Ingles, with plan to attempt for a catheter, and if unable to passively obtain his scan to evaluate for stone.  Unable to place Foley catheter. Have low suspicion urinary retention is secondary to cauda equina, given unchanged back pain, neurologic exam, normal rectal tone, and signs of urethral obstruction when attempting to place Foley catheter.  Ordered CT stone study which shows a 6 mm obstructing right sided stone, mild left-sided hydronephrosis, with a 1 cm bladder stone. Discussed again with Dr. Alyson Ingles, who recommends attempting placement of a 16-18 French coud catheter. If we are unable to place this, plan would be to discuss again with Dr. Alyson Ingles.  Patient with sign of distal obstruction at this time, however normal renal function.  Urinalysis shows no sign of infection here. Care signed out to Dr. Lita Mains with placement of Foley and urology follow up pending.  If foley placed, pt may be discharged without patient urology follow up, flomax.        Final Clinical Impressions(s) / ED Diagnoses   Final diagnoses:  Urinary retention  Nephrolithiasis    New Prescriptions New Prescriptions   No medications on file     Argie Lober  Billy Fischer, MD 10/13/16 2224

## 2016-10-13 NOTE — ED Notes (Signed)
Pt educated on use of leg bag and how to empty it. Pt verbalized understanding and has no further questions. NAD VSS pt ambulatory

## 2016-10-13 NOTE — ED Provider Notes (Signed)
Coude cath successfully placed by nursing staff. Patient with immediate relief. Produced 700 mL of urine. Abdomen is soft and nontender. Patient is very well-appearing. Vital signs remained stable. We'll discharge home to follow-up with his urologist at the [facility] or with [facility]. Return precautions given.   Julianne Rice, MD 10/13/16 [phone]

## 2016-10-13 NOTE — ED Triage Notes (Signed)
Pt c/o 10/10 penil pain and urine difficulty, pt states he is having urgency and then he can make just a litter amount of urine, some blood on the urine, pt states he believes it can be a kidney stone. No fever or chills.

## 2016-10-13 NOTE — ED Notes (Signed)
Patient denies fever, chills, nausea, vomiting, diarrhea.  Severe pain in penis/urethra.  Frequent and painful urination.

## 2016-10-17 ENCOUNTER — Other Ambulatory Visit: Payer: Self-pay | Admitting: Internal Medicine

## 2016-10-19 HISTORY — PX: KIDNEY STONE SURGERY: SHX686

## 2016-10-23 ENCOUNTER — Encounter (HOSPITAL_COMMUNITY): Payer: Self-pay | Admitting: Emergency Medicine

## 2016-10-23 ENCOUNTER — Inpatient Hospital Stay (HOSPITAL_COMMUNITY)
Admission: EM | Admit: 2016-10-23 | Discharge: 2016-10-24 | DRG: 699 | Disposition: A | Discharge status: Home / Self Care | Payer: PPO | Attending: Internal Medicine | Admitting: Internal Medicine

## 2016-10-23 ENCOUNTER — Emergency Department (HOSPITAL_COMMUNITY): Payer: PPO

## 2016-10-23 DIAGNOSIS — Z96642 Presence of left artificial hip joint: Secondary | ICD-10-CM | POA: Diagnosis not present

## 2016-10-23 DIAGNOSIS — K219 Gastro-esophageal reflux disease without esophagitis: Secondary | ICD-10-CM | POA: Diagnosis not present

## 2016-10-23 DIAGNOSIS — Y929 Unspecified place or not applicable: Secondary | ICD-10-CM | POA: Diagnosis not present

## 2016-10-23 DIAGNOSIS — Z8673 Personal history of transient ischemic attack (TIA), and cerebral infarction without residual deficits: Secondary | ICD-10-CM | POA: Diagnosis not present

## 2016-10-23 DIAGNOSIS — R310 Gross hematuria: Secondary | ICD-10-CM | POA: Diagnosis present

## 2016-10-23 DIAGNOSIS — Z825 Family history of asthma and other chronic lower respiratory diseases: Secondary | ICD-10-CM

## 2016-10-23 DIAGNOSIS — Z951 Presence of aortocoronary bypass graft: Secondary | ICD-10-CM | POA: Diagnosis not present

## 2016-10-23 DIAGNOSIS — Z9079 Acquired absence of other genital organ(s): Secondary | ICD-10-CM | POA: Diagnosis not present

## 2016-10-23 DIAGNOSIS — R319 Hematuria, unspecified: Secondary | ICD-10-CM | POA: Diagnosis present

## 2016-10-23 DIAGNOSIS — Z87442 Personal history of urinary calculi: Secondary | ICD-10-CM

## 2016-10-23 DIAGNOSIS — I10 Essential (primary) hypertension: Secondary | ICD-10-CM | POA: Diagnosis present

## 2016-10-23 DIAGNOSIS — Z8547 Personal history of malignant neoplasm of testis: Secondary | ICD-10-CM | POA: Diagnosis not present

## 2016-10-23 DIAGNOSIS — N12 Tubulo-interstitial nephritis, not specified as acute or chronic: Secondary | ICD-10-CM

## 2016-10-23 DIAGNOSIS — N200 Calculus of kidney: Secondary | ICD-10-CM

## 2016-10-23 DIAGNOSIS — X58XXXA Exposure to other specified factors, initial encounter: Secondary | ICD-10-CM | POA: Diagnosis not present

## 2016-10-23 DIAGNOSIS — G8929 Other chronic pain: Secondary | ICD-10-CM | POA: Diagnosis present

## 2016-10-23 DIAGNOSIS — Z79899 Other long term (current) drug therapy: Secondary | ICD-10-CM | POA: Diagnosis not present

## 2016-10-23 DIAGNOSIS — Z841 Family history of disorders of kidney and ureter: Secondary | ICD-10-CM | POA: Diagnosis not present

## 2016-10-23 DIAGNOSIS — T83518A Infection and inflammatory reaction due to other urinary catheter, initial encounter: Secondary | ICD-10-CM | POA: Diagnosis not present

## 2016-10-23 DIAGNOSIS — I2510 Atherosclerotic heart disease of native coronary artery without angina pectoris: Secondary | ICD-10-CM | POA: Diagnosis present

## 2016-10-23 DIAGNOSIS — Z7982 Long term (current) use of aspirin: Secondary | ICD-10-CM | POA: Diagnosis not present

## 2016-10-23 DIAGNOSIS — Z8249 Family history of ischemic heart disease and other diseases of the circulatory system: Secondary | ICD-10-CM

## 2016-10-23 DIAGNOSIS — R338 Other retention of urine: Secondary | ICD-10-CM | POA: Diagnosis present

## 2016-10-23 DIAGNOSIS — E782 Mixed hyperlipidemia: Secondary | ICD-10-CM | POA: Diagnosis present

## 2016-10-23 DIAGNOSIS — M549 Dorsalgia, unspecified: Secondary | ICD-10-CM | POA: Diagnosis present

## 2016-10-23 DIAGNOSIS — K5900 Constipation, unspecified: Secondary | ICD-10-CM | POA: Diagnosis present

## 2016-10-23 DIAGNOSIS — Z833 Family history of diabetes mellitus: Secondary | ICD-10-CM | POA: Diagnosis not present

## 2016-10-23 DIAGNOSIS — M1990 Unspecified osteoarthritis, unspecified site: Secondary | ICD-10-CM | POA: Diagnosis present

## 2016-10-23 DIAGNOSIS — T83511D Infection and inflammatory reaction due to indwelling urethral catheter, subsequent encounter: Secondary | ICD-10-CM | POA: Diagnosis not present

## 2016-10-23 DIAGNOSIS — N390 Urinary tract infection, site not specified: Secondary | ICD-10-CM | POA: Diagnosis not present

## 2016-10-23 DIAGNOSIS — Z7902 Long term (current) use of antithrombotics/antiplatelets: Secondary | ICD-10-CM

## 2016-10-23 DIAGNOSIS — N401 Enlarged prostate with lower urinary tract symptoms: Secondary | ICD-10-CM | POA: Diagnosis present

## 2016-10-23 LAB — CBC WITH DIFFERENTIAL/PLATELET
Basophils Absolute: 0 K/uL (ref 0.0–0.1)
Basophils Relative: 0 %
Eosinophils Absolute: 0.2 K/uL (ref 0.0–0.7)
Eosinophils Relative: 2 %
HCT: 36.6 % — ABNORMAL LOW (ref 39.0–52.0)
Hemoglobin: 12 g/dL — ABNORMAL LOW (ref 13.0–17.0)
Lymphocytes Relative: 10 %
Lymphs Abs: 0.9 K/uL (ref 0.7–4.0)
MCH: 30.9 pg (ref 26.0–34.0)
MCHC: 32.8 g/dL (ref 30.0–36.0)
MCV: 94.3 fL (ref 78.0–100.0)
Monocytes Absolute: 1.1 K/uL — ABNORMAL HIGH (ref 0.1–1.0)
Monocytes Relative: 12 %
Neutro Abs: 7 K/uL (ref 1.7–7.7)
Neutrophils Relative %: 76 %
Platelets: 217 K/uL (ref 150–400)
RBC: 3.88 MIL/uL — ABNORMAL LOW (ref 4.22–5.81)
RDW: 14 % (ref 11.5–15.5)
WBC: 9.1 K/uL (ref 4.0–10.5)

## 2016-10-23 LAB — URINALYSIS, ROUTINE W REFLEX MICROSCOPIC
BILIRUBIN URINE: NEGATIVE
GLUCOSE, UA: NEGATIVE mg/dL
Ketones, ur: NEGATIVE mg/dL
NITRITE: POSITIVE — AB
Protein, ur: 100 mg/dL — AB
SPECIFIC GRAVITY, URINE: 1.021 (ref 1.005–1.030)
Squamous Epithelial / LPF: NONE SEEN
pH: 5 (ref 5.0–8.0)

## 2016-10-23 LAB — COMPREHENSIVE METABOLIC PANEL
ALBUMIN: 3.5 g/dL (ref 3.5–5.0)
ALT: 15 U/L — ABNORMAL LOW (ref 17–63)
AST: 18 U/L (ref 15–41)
Alkaline Phosphatase: 58 U/L (ref 38–126)
Anion gap: 9 (ref 5–15)
BUN: 20 mg/dL (ref 6–20)
CHLORIDE: 104 mmol/L (ref 101–111)
CO2: 23 mmol/L (ref 22–32)
Calcium: 9.1 mg/dL (ref 8.9–10.3)
Creatinine, Ser: 0.86 mg/dL (ref 0.61–1.24)
GFR calc Af Amer: >60 mL/min (ref >60)
GFR calc non Af Amer: >60 mL/min (ref >60)
GLUCOSE: 121 mg/dL — AB (ref 65–99)
POTASSIUM: 3.8 mmol/L (ref 3.5–5.1)
Sodium: 136 mmol/L (ref 135–145)
Total Bilirubin: 1 mg/dL (ref 0.3–1.2)
Total Protein: 6.3 g/dL — ABNORMAL LOW (ref 6.5–8.1)

## 2016-10-23 LAB — MRSA PCR SCREENING: MRSA BY PCR: NEGATIVE

## 2016-10-23 LAB — I-STAT CG4 LACTIC ACID, ED
LACTIC ACID, VENOUS: 0.74 mmol/L (ref 0.5–1.9)
LACTIC ACID, VENOUS: 0.6 mmol/L (ref 0.5–1.9)

## 2016-10-23 LAB — LIPASE, BLOOD: Lipase: 33 U/L (ref 11–51)

## 2016-10-23 MED ORDER — MORPHINE SULFATE (PF) 4 MG/ML IV SOLN
4.0000 mg | INTRAVENOUS | Status: DC | PRN
  Administered 2016-10-23 – 2016-10-24 (×2): 4 mg via INTRAVENOUS
  Filled 2016-10-23 (×2): qty 1

## 2016-10-23 MED ORDER — ISOSORBIDE MONONITRATE ER 30 MG PO TB24
30.0000 mg | ORAL_TABLET | Freq: Every day | ORAL | Status: DC
  Administered 2016-10-23 – 2016-10-24 (×2): 30 mg via ORAL
  Filled 2016-10-23 (×2): qty 1

## 2016-10-23 MED ORDER — DEXTROSE 5 % IV SOLN
1.0000 g | INTRAVENOUS | Status: DC
  Administered 2016-10-24: 1 g via INTRAVENOUS
  Filled 2016-10-23: qty 10

## 2016-10-23 MED ORDER — DEXTROSE 5 % IV SOLN: 1.0000 g | INTRAVENOUS | Status: DC

## 2016-10-23 MED ORDER — BISACODYL 5 MG PO TBEC: 10.0000 mg | DELAYED_RELEASE_TABLET | Freq: Every day | ORAL | Status: DC | PRN

## 2016-10-23 MED ORDER — HYDRALAZINE HCL 20 MG/ML IJ SOLN: 10.0000 mg | Freq: Four times a day (QID) | INTRAMUSCULAR | Status: DC | PRN

## 2016-10-23 MED ORDER — POLYETHYLENE GLYCOL 3350 17 G PO PACK: 17.0000 g | PACK | Freq: Every day | ORAL | Status: DC | PRN

## 2016-10-23 MED ORDER — MORPHINE SULFATE (PF) 4 MG/ML IV SOLN
4.0000 mg | Freq: Once | INTRAVENOUS | Status: AC
  Administered 2016-10-23: 4 mg via INTRAVENOUS
  Filled 2016-10-23: qty 1

## 2016-10-23 MED ORDER — MAGNESIUM OXIDE 400 (241.3 MG) MG PO TABS
400.0000 mg | ORAL_TABLET | Freq: Two times a day (BID) | ORAL | Status: DC
  Administered 2016-10-23 – 2016-10-24 (×3): 400 mg via ORAL
  Filled 2016-10-23 (×5): qty 1

## 2016-10-23 MED ORDER — PANTOPRAZOLE SODIUM 40 MG PO TBEC
40.0000 mg | DELAYED_RELEASE_TABLET | Freq: Every day | ORAL | Status: DC
  Administered 2016-10-23 – 2016-10-24 (×2): 40 mg via ORAL
  Filled 2016-10-23 (×2): qty 1

## 2016-10-23 MED ORDER — CLOPIDOGREL BISULFATE 75 MG PO TABS
75.0000 mg | ORAL_TABLET | Freq: Every day | ORAL | Status: DC
  Administered 2016-10-23 – 2016-10-24 (×2): 75 mg via ORAL
  Filled 2016-10-23 (×2): qty 1

## 2016-10-23 MED ORDER — DEXTROSE 5 % IV SOLN: 1.0000 g | Freq: Once | INTRAVENOUS | Status: DC

## 2016-10-23 MED ORDER — OXYBUTYNIN CHLORIDE ER 5 MG PO TB24
5.0000 mg | ORAL_TABLET | Freq: Every evening | ORAL | Status: DC
  Administered 2016-10-23: 5 mg via ORAL
  Filled 2016-10-23: qty 1

## 2016-10-23 MED ORDER — DOCUSATE SODIUM 100 MG PO CAPS
100.0000 mg | ORAL_CAPSULE | Freq: Every day | ORAL | Status: DC
  Administered 2016-10-24: 100 mg via ORAL
  Filled 2016-10-23 (×2): qty 1

## 2016-10-23 MED ORDER — ASPIRIN EC 81 MG PO TBEC
81.0000 mg | DELAYED_RELEASE_TABLET | Freq: Every day | ORAL | Status: DC
  Administered 2016-10-23 – 2016-10-24 (×2): 81 mg via ORAL
  Filled 2016-10-23 (×2): qty 1

## 2016-10-23 MED ORDER — ONDANSETRON HCL 4 MG PO TABS: 4.0000 mg | ORAL_TABLET | Freq: Four times a day (QID) | ORAL | Status: DC | PRN

## 2016-10-23 MED ORDER — VITAMIN D 1000 UNITS PO TABS
1000.0000 [IU] | ORAL_TABLET | Freq: Two times a day (BID) | ORAL | Status: DC
  Administered 2016-10-23 – 2016-10-24 (×2): 1000 [IU] via ORAL
  Filled 2016-10-23 (×3): qty 1

## 2016-10-23 MED ORDER — AMLODIPINE BESYLATE 2.5 MG PO TABS
2.5000 mg | ORAL_TABLET | Freq: Every day | ORAL | Status: DC
  Administered 2016-10-23 – 2016-10-24 (×2): 2.5 mg via ORAL
  Filled 2016-10-23 (×2): qty 1

## 2016-10-23 MED ORDER — TRAZODONE HCL 50 MG PO TABS: 25.0000 mg | ORAL_TABLET | Freq: Every evening | ORAL | Status: DC | PRN

## 2016-10-23 MED ORDER — SENNA 8.6 MG PO TABS: 1.0000 | ORAL_TABLET | Freq: Every day | ORAL | Status: DC | PRN

## 2016-10-23 MED ORDER — PANTOPRAZOLE SODIUM 40 MG PO TBEC: 40.0000 mg | DELAYED_RELEASE_TABLET | Freq: Every day | ORAL | Status: DC

## 2016-10-23 MED ORDER — SODIUM CHLORIDE 0.9 % IV BOLUS (SEPSIS)
1000.0000 mL | Freq: Once | INTRAVENOUS | Status: AC
  Administered 2016-10-23: 1000 mL via INTRAVENOUS

## 2016-10-23 MED ORDER — DEXTROSE 5 % IV SOLN
1.0000 g | Freq: Once | INTRAVENOUS | Status: AC
  Administered 2016-10-23: 1 g via INTRAVENOUS
  Filled 2016-10-23: qty 10

## 2016-10-23 MED ORDER — NITROGLYCERIN 0.4 MG SL SUBL: 0.4000 mg | SUBLINGUAL_TABLET | SUBLINGUAL | Status: DC | PRN

## 2016-10-23 MED ORDER — FOLIC ACID 1 MG PO TABS
1.0000 mg | ORAL_TABLET | Freq: Two times a day (BID) | ORAL | Status: DC
  Administered 2016-10-24: 1 mg via ORAL
  Filled 2016-10-23 (×2): qty 1

## 2016-10-23 MED ORDER — TRAMADOL HCL 50 MG PO TABS
50.0000 mg | ORAL_TABLET | Freq: Four times a day (QID) | ORAL | Status: DC | PRN
  Administered 2016-10-23 – 2016-10-24 (×3): 50 mg via ORAL
  Filled 2016-10-23 (×2): qty 1

## 2016-10-23 MED ORDER — OMEGA-3-ACID ETHYL ESTERS 1 G PO CAPS
1.0000 g | ORAL_CAPSULE | Freq: Two times a day (BID) | ORAL | Status: DC
  Administered 2016-10-23 – 2016-10-24 (×3): 1 g via ORAL
  Filled 2016-10-23 (×3): qty 1

## 2016-10-23 MED ORDER — ATORVASTATIN CALCIUM 40 MG PO TABS
40.0000 mg | ORAL_TABLET | Freq: Every evening | ORAL | Status: DC
  Administered 2016-10-23: 40 mg via ORAL
  Filled 2016-10-23 (×2): qty 1

## 2016-10-23 MED ORDER — ONDANSETRON HCL 4 MG/2ML IJ SOLN: 4.0000 mg | Freq: Four times a day (QID) | INTRAMUSCULAR | Status: DC | PRN

## 2016-10-23 NOTE — ED Notes (Addendum)
Pt to CT

## 2016-10-23 NOTE — ED Notes (Signed)
Patient transported to CT 

## 2016-10-23 NOTE — H&P (Signed)
History and Physical    [name]:[id number] DOB: [birth year] DOA: 10/23/2016  PCP: Golden Circle, FNP   Patient coming from: home  Chief Complaint: blood in urine, suprapubic abdominal pain and dysuria  HPI: [name] is a 76 y.o. male with medical history significant of CAD, DJD, HTN, HLD, GERD, TI , testicular CA who presented to the ED with complaints of abdominal pain in the suprapubic region, dyzuria and blood in the urine.  Patient has been seen in the ED on 10/13/16 with c/o dysuria, hematuria and penile pain, diagnosed with urinary retention for 2-3 and discharged from the ED with an indwelling cathter. His symptoms were getting wose and he presented to the ED for evaluation Patient denied nausea, vomiting, fever, chills, c/o chronic back pain, constipations.  ED Course: patient was afebrile on presentation, VS were stable, blood work demonstrated normal WBC count and renal function Abdominal CT demonstrated bilateral nephrolithiasis with the possibility of ureteral stones could not be excluded, at least two bladder calculi were noted UA was suspicious for UTI with large amount of red blood cells and leukocytes seen in the urine and positive nitrate test.  Review of Systems: As per HPI otherwise 10 point review of systems negative.   Ambulatory Status: independent   Past Medical History:  Diagnosis Date  . Anginal pain (HCC)    pressure- last time 11/23/12 saw Dr Harrington Challenger  . CAD, NATIVE VESSEL 07/22/2008    a.  s/p DES to LAD in 2009; s/p repeat DES to LAD 11/2008 (prox to previous stent).  . Constipation   . DIZZINESS, CHRONIC 02/27/2009  . DJD (degenerative joint disease)   . Edema 12/05/2008  . GERD (gastroesophageal reflux disease)   . History of kidney stones   . HYPERLIPIDEMIA-MIXED 07/22/2008  . HYPERTENSION, BENIGN 07/22/2008  . Lung nodule    RLL lung nodule (PET CT 1/10 negative);R parotid hypermetabolic on PET 07/5051  . Shortness of breath    with  exertion  . Stroke [facility]) [phone]   TIA  , Left hand stinging , hurts to flex  . Testicular cancer [facility])     Past Surgical History:  Procedure Laterality Date  . APPENDECTOMY    . Bilateral orchiectomy    . CORONARY ARTERY BYPASS GRAFT N/A 12/01/2012   Procedure: CORONARY ARTERY BYPASS GRAFTING (CABG);  Surgeon: Gaye Pollack, MD;  LIMA to OM and RIMA to RCA  . ROTATOR CUFF REPAIR     right  . TONSILLECTOMY    . TOTAL HIP ARTHROPLASTY Left   . Veins stripped      Social History   Social History  . Marital status: Widowed    Spouse name: N/A  . Number of children: 2  . Years of education: 14   Occupational History  . Event planner    . Security guard    Social History Main Topics  . Smoking status: Never Smoker  . Smokeless tobacco: Never Used  . Alcohol use Yes     Comment: rare  . Drug use: No  . Sexual activity: Not Currently   Other Topics Concern  . Not on file   Social History Narrative   Fun: Watch baseball, disaster mission through [employer] and travel.   Denies abuse and feels safe at home.     No Known Allergies  Family History  Problem Relation Age of Onset  . Aneurysm Brother   . Kidney disease Brother   . Heart disease Mother   .  Hypertension Mother   . Diabetes Mother   . COPD Father   . Heart attack Neg Hx   . Stroke Neg Hx     Prior to Admission medications   Medication Sig Start Date End Date Taking? Authorizing Provider  amLODipine (NORVASC) 5 MG tablet Take 0.5 tablets (2.5 mg total) by mouth daily. 01/13/15   Fay Records, MD  aspirin EC 81 MG tablet Take 81 mg by mouth daily.    [provider]  atorvastatin (LIPITOR) 40 MG tablet Take 1 tablet (40 mg total) by mouth daily. 01/22/14   Fay Records, MD  cholecalciferol (VITAMIN D) 1000 UNITS tablet Take 1,000 Units by mouth daily.    [provider]  clopidogrel (PLAVIX) 75 MG tablet Take 75 mg by mouth daily.    [provider]  Coenzyme Q10 (CO Q 10) 100  MG CAPS Take 2 capsules by mouth daily.     [provider]  Cyanocobalamin (B-12 PO) Take 1 tablet by mouth daily.    [provider]  Flaxseed, Linseed, (FLAX SEED OIL) 1000 MG CAPS Take 1 capsule by mouth daily.     [provider]  isosorbide mononitrate (IMDUR) 30 MG 24 hr tablet TAKE 1 TABLET(30 MG) BY MOUTH DAILY 10/18/16   Fay Records, MD  Misc Natural Products (OSTEO BI-FLEX JOINT SHIELD PO) Take 1 tablet by mouth daily.    [provider]  Multiple Vitamin (MULTIVITAMIN WITH MINERALS) TABS Take 1 tablet by mouth daily.    [provider]  nitroGLYCERIN (NITROSTAT) 0.4 MG SL tablet Place 1 tablet (0.4 mg total) under the tongue every 5 (five) minutes as needed for chest pain. 08/08/15   Fay Records, MD  Omega-3 Fatty Acids (FISH OIL) 1000 MG CAPS Take 2 capsules by mouth daily.    [provider]  oxybutynin (DITROPAN-XL) 5 MG 24 hr tablet Take 5 mg by mouth daily.    [provider]  pantoprazole (PROTONIX) 40 MG tablet TAKE 1 TABLET (40 MG TOTAL) BY MOUTH DAILY. 09/07/16   Golden Circle, FNP  polyethylene glycol (MIRALAX / GLYCOLAX) packet Take 17 g by mouth daily as needed. constipation    [provider]  sucralfate (CARAFATE) 1 g tablet Take 1 tablet (1 g total) by mouth 4 (four) times daily -  with meals and at bedtime. 03/02/16   Fay Records, MD  vitamin C (ASCORBIC ACID) 500 MG tablet Take 500 mg by mouth daily.    [provider]    Physical Exam: Vitals:   10/23/16 1145 10/23/16 1200 10/23/16 1215 10/23/16 1230  BP:  (!) 155/88 (!) 162/82 (!) 153/92  Pulse: 68 67 67 70  Resp: 15 19 16 20   Temp:      TempSrc:      SpO2: 99% 99% 99% 99%     General: Appears calm and comfortable Eyes: PERRLA, EOMI, normal lids, iris ENT:  grossly normal hearing, lips & tongue, mucous membranes moist and intact Neck: no lymphoadenopathy, masses or thyromegaly Cardiovascular: RRR, no m/r/g. No JVD,  carotid bruits. No LE edema.  Respiratory: bilateral no wheezes, rales, rhonchi or cracles. Normal respiratory effort. No accessory muscle use observed Abdomen: soft, tender to palpation in the suprapubic region, non-distended, no organomegaly or masses appreciated. BS present in all quadrants Skin: no rash, ulcers or induration seen on limited exam Musculoskeletal: grossly normal tone BUE/BLE, good ROM, no bony abnormality or joint deformities observed, bilateral CVA  tenderness Psychiatric: grossly normal mood and affect, speech fluent and appropriate, alert and oriented x3 Neurologic: CN II-XII grossly intact, moves all extremities in coordinated fashion, sensation intact  Labs on Admission: I have personally reviewed following labs and imaging studies  CBC, BMP  GFR: Estimated Creatinine Clearance: 89.5 mL/min (by C-G formula based on SCr of 0.86 mg/dL).   Creatinine Clearance: Estimated Creatinine Clearance: 89.5 mL/min (by C-G formula based on SCr of 0.86 mg/dL).    Radiological Exams on Admission: Ct Renal Stone Study  Result Date: 10/23/2016 CLINICAL DATA:  Lower abdominal pain and gross hematuria for 2 weeks. EXAM: CT ABDOMEN AND PELVIS WITHOUT CONTRAST TECHNIQUE: Multidetector CT imaging of the abdomen and pelvis was performed following the standard protocol without IV contrast. COMPARISON:  CT scan of October 13, 2016. FINDINGS: Lower chest: Stable calcified granuloma is noted in right lower lobe. No acute abnormality is noted. Stable moderate size sliding-type hiatal hernia. Hepatobiliary: Solitary gallstone is noted. No focal abnormality is seen in the liver on these unenhanced images. Pancreas: Unremarkable. No pancreatic ductal dilatation or surrounding inflammatory changes. Spleen: Normal in size without focal abnormality. Adrenals/Urinary Tract: Adrenal glands are unremarkable. Stable large right renal cyst is noted. Bilateral nephrolithiasis is noted. No hydronephrosis is noted.  Distal ureteral calculi cannot be excluded due to scatter artifact arising from left hip prosthesis. Foley catheter is noted in urinary bladder which is nearly completely decompressed. At least 2 bladder calculi are noted. Stomach/Bowel: There is no evidence of bowel obstruction. No inflammatory changes are noted. Patient is status post appendectomy. Vascular/Lymphatic: Aortic atherosclerosis. No enlarged abdominal or pelvic lymph nodes. Reproductive: Prostatic calcifications are noted. Other: Small fat containing ventral hernia is noted in epigastric region which is unchanged compared to prior exam. Musculoskeletal: Status post left hip arthroplasty. IMPRESSION: Stable cholelithiasis. Bilateral nonobstructive nephrolithiasis. Stable large right renal cyst. Distal ureteral calculi cannot be excluded due to scatter artifact arising from left hip prosthesis. Bladder calculi are noted. No hydronephrosis is noted. Aortic atherosclerosis. Small fat containing ventral hernia seen in epigastric region. Electronically Signed   By: Marijo Conception, M.D.   On: 10/23/2016 09:32    EKG: Not found  Assessment/Plan Principal Problem:   UTI (urinary tract infection) Active Problems:   Mixed hyperlipidemia   HYPERTENSION, BENIGN   CAD, NATIVE VESSEL   GERD (gastroesophageal reflux disease)   Hematuria   Nephrolithiasis    UTI, catheter associated - Rocephin started and culture submitted ED Physician spoke with urology on call and they agreed with Rocephin Follow urine culture result and adjust antibiotics if needed Will discontinue Foley cath and continue with bladder scans to monitor for retention  Nephrolithiasis - gross hematuria could be associated with this issue and superimposed infection as well Provide pain control and monitor for resolution of hematuria or stone passage.  No hydronephrosis seen on pelvic CT and urology prefer conservative approach  HTN - suboptimally controlled, most likely  associated with pain Continue home meds now with hydralazine prn  CAD with h/o CABG - stable, no anginal complaints Continue Imdur, aspirin, Plavix  GERD - continue PPI  Chronic radicular back pain Currently on IV Morphine for nephrolithiasis, at home patient usually takes Ultram and Aleve  DVT prophylaxis: SCD Code Status: full Family Communication: none Disposition Plan: Medsurg Consults called: urology consult via the phone by EDP Admission status:    Caprice Red Pager: [phone] [facility]  If 7PM-7AM, please contact night-coverage [email or web address] Password [email or web address]  10/23/2016, 1:53 PM

## 2016-10-23 NOTE — ED Notes (Signed)
Placed condom cath on pt. Pt is incontinent of urine.

## 2016-10-23 NOTE — ED Provider Notes (Signed)
[facility] DEPT Provider Note   CSN: [id number] Arrival date & time: 10/23/16  5784     History   Chief Complaint Chief Complaint  Patient presents with  . Hematuria    HPI [name] is a 76 y.o. male.  The history is provided by the patient and medical records.  Hematuria  This is a new problem. The current episode started more than 1 week ago. The problem occurs constantly. The problem has been rapidly worsening. Associated symptoms include abdominal pain. Pertinent negatives include no chest pain, no headaches and no shortness of breath. Nothing aggravates the symptoms. Nothing relieves the symptoms. He has tried nothing for the symptoms. The treatment provided no relief.  Abdominal Pain   This is a recurrent problem. The current episode started more than 1 week ago. The problem occurs constantly. The problem has been gradually worsening. The pain is located in the suprapubic region (abd bilateral back). The quality of the pain is aching and cramping. The pain is at a severity of 9/10. The pain is moderate. Associated symptoms include dysuria and hematuria. Pertinent negatives include fever, diarrhea, nausea, vomiting and headaches. The symptoms are aggravated by palpation. Nothing relieves the symptoms.    Past Medical History:  Diagnosis Date  . Anginal pain (HCC)    pressure- last time 11/23/12 saw Dr Harrington Challenger  . CAD, NATIVE VESSEL 07/22/2008    a.  s/p DES to LAD in 2009; s/p repeat DES to LAD 11/2008 (prox to previous stent).  . Constipation   . DIZZINESS, CHRONIC 02/27/2009  . DJD (degenerative joint disease)   . Edema 12/05/2008  . GERD (gastroesophageal reflux disease)   . History of kidney stones   . HYPERLIPIDEMIA-MIXED 07/22/2008  . HYPERTENSION, BENIGN 07/22/2008  . Lung nodule    RLL lung nodule (PET CT 1/10 negative);R parotid hypermetabolic on PET 11/9627  . Shortness of breath    with exertion  . Stroke [facility]) [phone]   TIA  , Left hand stinging , hurts to flex    . Testicular cancer [facility])     Patient Active Problem List   Diagnosis Date Noted  . Cough 05/11/2016  . GERD (gastroesophageal reflux disease) 05/11/2016  . Dizziness, nonspecific 10/11/2014  . 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 DIZZINESS, CHRONIC 02/27/2009  . ABDOMINAL PAIN 02/27/2009  . ABDOMINAL PAIN, GENERALIZED 02/27/2009  . EDEMA 12/05/2008  . CHEST PAIN 11/25/2008  . NECK PAIN 11/08/2008  . Shortness of breath 11/08/2008  . HYPERLIPIDEMIA-MIXED 07/22/2008  . HYPERTENSION, BENIGN 07/22/2008  . CAD, NATIVE VESSEL 07/22/2008    Past Surgical History:  Procedure Laterality Date  . APPENDECTOMY    . Bilateral orchiectomy    . CORONARY ARTERY BYPASS GRAFT N/A 12/01/2012   Procedure: CORONARY ARTERY BYPASS GRAFTING (CABG);  Surgeon: Gaye Pollack, MD;  LIMA to OM and RIMA to RCA  . ROTATOR CUFF REPAIR     right  . TONSILLECTOMY    . TOTAL HIP ARTHROPLASTY Left   . Veins stripped         Home Medications    Prior to Admission medications   Medication Sig Start Date End Date Taking? Authorizing Provider  amLODipine (NORVASC) 5 MG tablet Take 0.5 tablets (2.5 mg total) by mouth daily. 01/13/15   Fay Records, MD  aspirin EC 81 MG tablet Take 81 mg by mouth daily.    [provider]  atorvastatin (LIPITOR) 40 MG tablet Take 1 tablet (40 mg total) by mouth  daily. 01/22/14   Fay Records, MD  cholecalciferol (VITAMIN D) 1000 UNITS tablet Take 1,000 Units by mouth daily.    [provider]  clopidogrel (PLAVIX) 75 MG tablet Take 75 mg by mouth daily.    [provider]  Coenzyme Q10 (CO Q 10) 100 MG CAPS Take 2 capsules by mouth daily.     [provider]  Cyanocobalamin (B-12 PO) Take 1 tablet by mouth daily.    [provider]  Flaxseed, Linseed, (FLAX SEED OIL) 1000 MG CAPS Take 1 capsule by mouth daily.     [provider]  isosorbide mononitrate (IMDUR) 30 MG 24 hr tablet TAKE 1 TABLET(30 MG) BY MOUTH DAILY 10/18/16   Fay Records, MD  Misc Natural Products (OSTEO BI-FLEX JOINT SHIELD PO) Take 1 tablet by mouth daily.    [provider]  Multiple Vitamin (MULTIVITAMIN WITH MINERALS) TABS Take 1 tablet by mouth daily.    [provider]  nitroGLYCERIN (NITROSTAT) 0.4 MG SL tablet Place 1 tablet (0.4 mg total) under the tongue every 5 (five) minutes as needed for chest pain. 08/08/15   Fay Records, MD  Omega-3 Fatty Acids (FISH OIL) 1000 MG CAPS Take 2 capsules by mouth daily.    [provider]  oxybutynin (DITROPAN-XL) 5 MG 24 hr tablet Take 5 mg by mouth daily.    [provider]  pantoprazole (PROTONIX) 40 MG tablet TAKE 1 TABLET (40 MG TOTAL) BY MOUTH DAILY. 09/07/16   Golden Circle, FNP  polyethylene glycol (MIRALAX / GLYCOLAX) packet Take 17 g by mouth daily as needed. constipation    [provider]  sucralfate (CARAFATE) 1 g tablet Take 1 tablet (1 g total) by mouth 4 (four) times daily -  with meals and at bedtime. 03/02/16   Fay Records, MD  vitamin C (ASCORBIC ACID) 500 MG tablet Take 500 mg by mouth daily.    [provider]    Family History Family History  Problem Relation Age of Onset  . Aneurysm Brother   . Kidney disease Brother   . Heart disease Mother   . Hypertension Mother   . Diabetes Mother   . COPD Father   . Heart attack Neg Hx   . Stroke Neg Hx     Social History Social History  Substance Use Topics  . Smoking status: Never Smoker  . Smokeless tobacco: Never Used  . Alcohol use Yes     Comment: rare     Allergies   Patient has no known allergies.   Review of Systems Review of Systems  Constitutional: Negative for chills, diaphoresis, fatigue and fever.  HENT: Negative for congestion and rhinorrhea.   Eyes: Negative for visual disturbance.  Respiratory: Negative for chest tightness, shortness of breath and wheezing.   Cardiovascular: Negative for chest pain and palpitations.  Gastrointestinal: Positive for  abdominal pain. Negative for diarrhea, nausea and vomiting.  Genitourinary: Positive for discharge, dysuria and hematuria.  Musculoskeletal: Negative for back pain, neck pain and neck stiffness.  Skin: Negative for rash and wound.  Neurological: Negative for light-headedness and headaches.  Psychiatric/Behavioral: Negative for agitation.  All other systems reviewed and are negative.    Physical Exam Updated Vital Signs BP (!) 141/104 (BP Location: Right Arm)   Pulse 77   Temp 98.1 F (36.7 C) (Oral)   Resp 20   SpO2 97%   Physical Exam  Constitutional: He is oriented to person, place, and  time. He appears well-developed and well-nourished.  HENT:  Head: Normocephalic and atraumatic.  Mouth/Throat: Oropharynx is clear and moist. No oropharyngeal exudate.  Eyes: Conjunctivae are normal. Pupils are equal, round, and reactive to light.  Neck: Normal range of motion. Neck supple.  Cardiovascular: Normal rate, regular rhythm and intact distal pulses.   No murmur heard. Pulmonary/Chest: Effort normal and breath sounds normal. No stridor. No respiratory distress. He has no wheezes. He exhibits no tenderness.  Abdominal: Soft. There is tenderness. Hernia confirmed negative in the right inguinal area and confirmed negative in the left inguinal area.  Genitourinary: Right testis shows no tenderness. Left testis shows no tenderness.     Musculoskeletal: He exhibits tenderness. He exhibits no edema.       Lumbar back: He exhibits tenderness and pain.       Back:  Neurological: He is alert and oriented to person, place, and time. No cranial nerve deficit or sensory deficit. He exhibits normal muscle tone.  Skin: Skin is warm and dry. Capillary refill takes less than 2 seconds. No erythema. No pallor.  Psychiatric: He has a normal mood and affect.  Nursing note and vitals reviewed.    ED Treatments / Results  Labs (all labs ordered are listed, but only abnormal results are  displayed) Labs Reviewed  CBC WITH DIFFERENTIAL/PLATELET - Abnormal; Notable for the following:       Result Value   RBC 3.88 (*)    Hemoglobin 12.0 (*)    HCT 36.6 (*)    Monocytes Absolute 1.1 (*)    All other components within normal limits  COMPREHENSIVE METABOLIC PANEL - Abnormal; Notable for the following:    Glucose, Bld 121 (*)    Total Protein 6.3 (*)    ALT 15 (*)    All other components within normal limits  URINALYSIS, ROUTINE W REFLEX MICROSCOPIC - Abnormal; Notable for the following:    Color, Urine AMBER (*)    APPearance CLOUDY (*)    Hgb urine dipstick LARGE (*)    Protein, ur 100 (*)    Nitrite POSITIVE (*)    Leukocytes, UA LARGE (*)    Bacteria, UA RARE (*)    All other components within normal limits  CULTURE, BLOOD (ROUTINE X 2)  CULTURE, BLOOD (ROUTINE X 2)  URINE CULTURE  LIPASE, BLOOD  I-STAT CG4 LACTIC ACID, ED  I-STAT CG4 LACTIC ACID, ED    EKG  EKG Interpretation None       Radiology Ct Renal Stone Study  Result Date: 10/23/2016 CLINICAL DATA:  Lower abdominal pain and gross hematuria for 2 weeks. EXAM: CT ABDOMEN AND PELVIS WITHOUT CONTRAST TECHNIQUE: Multidetector CT imaging of the abdomen and pelvis was performed following the standard protocol without IV contrast. COMPARISON:  CT scan of October 13, 2016. FINDINGS: Lower chest: Stable calcified granuloma is noted in right lower lobe. No acute abnormality is noted. Stable moderate size sliding-type hiatal hernia. Hepatobiliary: Solitary gallstone is noted. No focal abnormality is seen in the liver on these unenhanced images. Pancreas: Unremarkable. No pancreatic ductal dilatation or surrounding inflammatory changes. Spleen: Normal in size without focal abnormality. Adrenals/Urinary Tract: Adrenal glands are unremarkable. Stable large right renal cyst is noted. Bilateral nephrolithiasis is noted. No hydronephrosis is noted. Distal ureteral calculi cannot be excluded due to scatter artifact arising  from left hip prosthesis. Foley catheter is noted in urinary bladder which is nearly completely decompressed. At least 2 bladder calculi are noted. Stomach/Bowel: There is no evidence  of bowel obstruction. No inflammatory changes are noted. Patient is status post appendectomy. Vascular/Lymphatic: Aortic atherosclerosis. No enlarged abdominal or pelvic lymph nodes. Reproductive: Prostatic calcifications are noted. Other: Small fat containing ventral hernia is noted in epigastric region which is unchanged compared to prior exam. Musculoskeletal: Status post left hip arthroplasty. IMPRESSION: Stable cholelithiasis. Bilateral nonobstructive nephrolithiasis. Stable large right renal cyst. Distal ureteral calculi cannot be excluded due to scatter artifact arising from left hip prosthesis. Bladder calculi are noted. No hydronephrosis is noted. Aortic atherosclerosis. Small fat containing ventral hernia seen in epigastric region. Electronically Signed   By: Marijo Conception, M.D.   On: 10/23/2016 09:32    Procedures Procedures (including critical care time)  Medications Ordered in ED Medications  cefTRIAXone (ROCEPHIN) 1 g in dextrose 5 % 50 mL IVPB (not administered)  sodium chloride 0.9 % bolus 1,000 mL (0 mLs Intravenous Stopped 10/23/16 0939)  morphine 4 MG/ML injection 4 mg (4 mg Intravenous Given 10/23/16 0838)  sodium chloride 0.9 % bolus 1,000 mL (1,000 mLs Intravenous New Bag/Given 10/23/16 1223)  morphine 4 MG/ML injection 4 mg (4 mg Intravenous Given 10/23/16 1224)     Initial Impression / Assessment and Plan / ED Course  I have reviewed the triage vital signs and the nursing notes.  Pertinent labs & imaging results that were available during my care of the patient were reviewed by me and considered in my medical decision making (see chart for details).     SAMUELL KNOBLE is a 76 y.o. male with a past medical history significant for CAD status post CABG, testicular cancer status post bilateral  orchiectomy, prior kidney stones, hypertension, hyperlipidemia, and recent discovery of recurrent nephrolithiasis with a Coude Foley catheter in place who presents with worsened back pain, lower abdominal pain, penile bleeding, and hematuria. Patient says that last week, he had urinary retention and pain prompting workup and discovery of a recurrent kidney stone. Patient also had A1c meter bladder stone. Patient had a Coude Foley catheter placed with resolution of the retention and was scheduled to follow-up with his urologist. Patient says that his urologist as scheduled a CT scan on Tuesday, 3 days from now, and then cystoscopy for further evaluation. Patient says that over the last 2 days, he has had worsening low back pain bilaterally, worsened abdominal pain, and worsened hematuria. Patient says that he is bleeding around the catheter as well as into the catheter. He also thought there was "pus-like" fluid coming from around his catheter.   Patient describes his pain as a 9 out of 10 severity, and constant. He denies nausea or vomiting, fevers, or chills, or other symptoms. He does report decreased appetite.  History and exam are seen above. On exam, patient has dry blood around the catheter on the meatus. No penile tenderness. Mild suprapubic tenderness. Bilateral CVA tenderness. Lungs clear. No focal neurologic deficits.  Based on symptoms, suspect developing infection or worsened hydronephrosis from obstruction. Patient will have repeat CT scan, urinalysis, and lab testing. Patient given fluids and pain medicine during workup.  Patient's diagnostic testing revealed evidence of urinary tract infection. Given his pain in his CVA areas and back, suspect pyelonephritis. Patient will be treated for pyelonephritis.   CT imaging showed nonobstructing stones and no hydronephrosis. As the patient has stones in his bladder and current infection, urology was called for recommendations. They did not feel the  patient needs a source control with any procedures at this time but they did recommend antibiotics.  Patient given more pain medicine for continued severe pain. Patient given more fluids.  Due to pyelonephritis with purulence and severe pain, patient will be admitted to hospitalist service for further management and IV antibiotics.   Final Clinical Impressions(s) / ED Diagnoses   Final diagnoses:  Pyelonephritis   Clinical Impression: 1. Pyelonephritis     Disposition: Admit to Hospitalist service    Amelia Macken, Gwenyth Allegra, MD 10/23/16 [phone]

## 2016-10-23 NOTE — ED Triage Notes (Signed)
Patient reports hematuria onset this week , denies injury , he has an indwelling urinary catheter/leg bag , pt. added mild dysuria , denies fever or chills . He was seen here 10/13/16 diagnosed with renal stone / coude catheter inserted during his visit.

## 2016-10-24 DIAGNOSIS — I2510 Atherosclerotic heart disease of native coronary artery without angina pectoris: Secondary | ICD-10-CM

## 2016-10-24 DIAGNOSIS — R310 Gross hematuria: Secondary | ICD-10-CM

## 2016-10-24 DIAGNOSIS — N200 Calculus of kidney: Secondary | ICD-10-CM

## 2016-10-24 DIAGNOSIS — I10 Essential (primary) hypertension: Secondary | ICD-10-CM

## 2016-10-24 DIAGNOSIS — T83511D Infection and inflammatory reaction due to indwelling urethral catheter, subsequent encounter: Secondary | ICD-10-CM

## 2016-10-24 DIAGNOSIS — N390 Urinary tract infection, site not specified: Secondary | ICD-10-CM

## 2016-10-24 LAB — CBC
HCT: 34.2 % — ABNORMAL LOW (ref 39.0–52.0)
HEMOGLOBIN: 11.3 g/dL — AB (ref 13.0–17.0)
MCH: 31 pg (ref 26.0–34.0)
MCHC: 33 g/dL (ref 30.0–36.0)
MCV: 94 fL (ref 78.0–100.0)
Platelets: 207 10*3/uL (ref 150–400)
RBC: 3.64 MIL/uL — AB (ref 4.22–5.81)
RDW: 14 % (ref 11.5–15.5)
WBC: 7.9 10*3/uL (ref 4.0–10.5)

## 2016-10-24 LAB — BASIC METABOLIC PANEL
ANION GAP: 8 (ref 5–15)
BUN: 17 mg/dL (ref 6–20)
CO2: 24 mmol/L (ref 22–32)
Calcium: 8.8 mg/dL — ABNORMAL LOW (ref 8.9–10.3)
Chloride: 102 mmol/L (ref 101–111)
Creatinine, Ser: 0.84 mg/dL (ref 0.61–1.24)
GFR calc non Af Amer: >60 mL/min (ref >60)
Glucose, Bld: 135 mg/dL — ABNORMAL HIGH (ref 65–99)
Potassium: 4 mmol/L (ref 3.5–5.1)
Sodium: 134 mmol/L — ABNORMAL LOW (ref 135–145)

## 2016-10-24 LAB — URINE CULTURE

## 2016-10-24 MED ORDER — TAMSULOSIN HCL 0.4 MG PO CAPS
0.4000 mg | ORAL_CAPSULE | Freq: Every day | ORAL | Status: DC
  Administered 2016-10-24: 0.4 mg via ORAL
  Filled 2016-10-24: qty 1

## 2016-10-24 MED ORDER — SENNA 8.6 MG PO TABS: 2.0000 | ORAL_TABLET | Freq: Every day | ORAL | Status: DC

## 2016-10-24 MED ORDER — PHENAZOPYRIDINE HCL 100 MG PO TABS
100.0000 mg | ORAL_TABLET | Freq: Three times a day (TID) | ORAL | Status: DC
  Administered 2016-10-24: 100 mg via ORAL
  Filled 2016-10-24 (×2): qty 1

## 2016-10-24 MED ORDER — HYDROCODONE-ACETAMINOPHEN 10-325 MG PO TABS: 1.0000 | ORAL_TABLET | ORAL | 0 refills | Status: DC | PRN

## 2016-10-24 MED ORDER — POLYETHYLENE GLYCOL 3350 17 GM/SCOOP PO POWD: 17.0000 g | Freq: Two times a day (BID) | ORAL | 0 refills | Status: DC

## 2016-10-24 MED ORDER — PHENAZOPYRIDINE HCL 100 MG PO TABS: 100.0000 mg | ORAL_TABLET | Freq: Three times a day (TID) | ORAL | 0 refills | Status: DC

## 2016-10-24 MED ORDER — CIPROFLOXACIN HCL 500 MG PO TABS: 500.0000 mg | ORAL_TABLET | Freq: Two times a day (BID) | ORAL | 0 refills | Status: DC

## 2016-10-24 MED ORDER — TAMSULOSIN HCL 0.4 MG PO CAPS: 0.4000 mg | ORAL_CAPSULE | Freq: Every day | ORAL | 0 refills | Status: DC

## 2016-10-24 MED ORDER — POLYETHYLENE GLYCOL 3350 17 G PO PACK
17.0000 g | PACK | Freq: Every day | ORAL | Status: DC
  Administered 2016-10-24: 17 g via ORAL
  Filled 2016-10-24: qty 1

## 2016-10-24 MED ORDER — HYDROCODONE-ACETAMINOPHEN 10-325 MG PO TABS
1.0000 | ORAL_TABLET | ORAL | Status: DC | PRN
  Administered 2016-10-24: 1 via ORAL
  Filled 2016-10-24: qty 1

## 2016-10-24 NOTE — Discharge Summary (Signed)
Physician Discharge Summary  [name] XNA:[id number] DOB: 02-02-1941 DOA: 10/23/2016  PCP: Golden Circle, FNP  Admit date: 10/23/2016 Discharge date: 10/24/2016  Admitted From: home  Disposition:  home  Recommendations for Outpatient Follow-up:  1. Follow up with Urology later this week at already scheduled appointment 2. Ciprofloxacin, flomax, and vicodin prescribed 3. Follow up:  Blood cultures pending  Home Health:  none  Equipment/Devices:  None  Discharge Condition:  Stable, improved CODE STATUS:  Full code  Diet recommendation:  Healthy heart   Brief/Interim Summary:  The patient is a 76 year old male with history of coronary artery disease, GERD, testicular cancer, BPH with recent acute urinary retention requiring Foley catheter placement on 10/13/2016. Ever since his catheter was placed had ongoing symptom of dysuria. He was diagnosed with a subsequent urinary tract infection and took some antibiotics. He was at work the night of admission when he suddenly developed gross hematuria and lumbar back pain. During one of his voids, he had a lot of difficulty getting history of started and then he suddenly felt a pop in his stream flowed more easily. He felt like he may have passed the stone. He was still having hematuria so he came to the emergency department for evaluation. Abdominal CT demonstrate a bilateral nephrolithiasis without evidence of hydronephrosis or obvious ureteral stones. He had 2 bladder calculi. Because of some hardware in his hip, it was difficult to see if he had any retained distal ureteral stones. His urinalysis was notable for copious red blood cells and white blood cells and was positive for nitrates.  His Foley catheter was removed. He was started on antibiotics and pain medication and admitted for ongoing treatment.  Discharge Diagnoses:  Principal Problem:   UTI (urinary tract infection) Active Problems:   Mixed hyperlipidemia   HYPERTENSION, BENIGN    CAD, NATIVE VESSEL   GERD (gastroesophageal reflux disease)   Hematuria   Nephrolithiasis  UTI, catheter associated and present at time of admission.  Given Rocephin IV.  Culture grew mixed flora. -  Transitioned to ciprofloxacin   Nephrolithiasis - gross hematuria improved  -  Continued to have some intermittent back pain  -  Already takes Ultram for chronic back pain at home -  Given IV morphine as needed during hospitalization -  Started hydrocodone-apap with miralax -  Started flomax    Urinary retention, likely due to enlarged prostate.  Foley removed.  Patient voided regularly.  Had post-void residuals of around 263mL.   -  D/c oxybutynin -  Started flomax -  Trend PVR -   f/u with Urology in a few days  HTN - suboptimally controlled, most likely associated with pain and trended down with pain control Continued home meds  CAD with h/o CABG - stable, no anginal complaints Continued Imdur, aspirin, Plavix  GERD - continued PPI  Chronic radicular back pain - Continued Ultram and Aleve  Discharge Instructions  Discharge Instructions    Call MD for:  difficulty breathing, headache or visual disturbances    Complete by:  As directed    Call MD for:  extreme fatigue    Complete by:  As directed    Call MD for:  hives    Complete by:  As directed    Call MD for:  persistant dizziness or light-headedness    Complete by:  As directed    Call MD for:  persistant nausea and vomiting    Complete by:  As directed  Call MD for:  severe uncontrolled pain    Complete by:  As directed    Call MD for:  temperature >100.4    Complete by:  As directed    Diet - low sodium heart healthy    Complete by:  As directed    Increase activity slowly    Complete by:  As directed        Medication List    STOP taking these medications   ALEVE 220 MG tablet Generic drug:  naproxen sodium   docusate sodium 50 MG capsule Commonly known as:  COLACE   oxybutynin 5 MG 24 hr  tablet Commonly known as:  DITROPAN-XL   polyethylene glycol packet Commonly known as:  MIRALAX / GLYCOLAX Replaced by:  polyethylene glycol powder powder     TAKE these medications   amLODipine 5 MG tablet Commonly known as:  NORVASC Take 0.5 tablets (2.5 mg total) by mouth daily.   aspirin EC 81 MG tablet Take 81 mg by mouth daily.   atorvastatin 40 MG tablet Commonly known as:  LIPITOR Take 1 tablet (40 mg total) by mouth daily. What changed:  when to take this   b complex vitamins tablet Take 1 tablet by mouth 2 (two) times daily.   cholecalciferol 1000 units tablet Commonly known as:  VITAMIN D Take 1,000 Units by mouth 2 (two) times daily.   ciprofloxacin 500 MG tablet Commonly known as:  CIPRO Take 1 tablet (500 mg total) by mouth 2 (two) times daily.   clopidogrel 75 MG tablet Commonly known as:  PLAVIX Take 75 mg by mouth daily.   Co Q 10 100 MG Caps Take 100 mg by mouth daily.   cyanocobalamin 500 MCG tablet Take 500 mcg by mouth 2 (two) times daily. Vitamin B12   Fish Oil 500 MG Caps Take 500 mg by mouth 2 (two) times daily.   FLAX SEED OIL PO Take 1 tablet by mouth 2 (two) times daily.   folic acid 1 MG tablet Commonly known as:  FOLVITE Take 1 mg by mouth 2 (two) times daily.   GOODY HEADACHE PO Take 2 packets by mouth 2 (two) times daily as needed (pain/headache).   HYDROcodone-acetaminophen 10-325 MG tablet Commonly known as:  NORCO Take 1 tablet by mouth every 4 (four) hours as needed for moderate pain or severe pain.   isosorbide mononitrate 30 MG 24 hr tablet Commonly known as:  IMDUR TAKE 1 TABLET(30 MG) BY MOUTH DAILY What changed:  See the new instructions.   MAGNESIUM PO Take 1 tablet by mouth 2 (two) times daily.   multivitamin with minerals Tabs tablet Take 1 tablet by mouth 2 (two) times daily. Centrum Over 50   nitroGLYCERIN 0.4 MG SL tablet Commonly known as:  NITROSTAT Place 1 tablet (0.4 mg total) under the tongue  every 5 (five) minutes as needed for chest pain.   omeprazole 40 MG capsule Commonly known as:  PRILOSEC Take 40 mg by mouth every evening.   OSTEO BI-FLEX JOINT SHIELD PO Take 1 tablet by mouth 2 (two) times daily.   pantoprazole 40 MG tablet Commonly known as:  PROTONIX TAKE 1 TABLET (40 MG TOTAL) BY MOUTH DAILY.   phenazopyridine 100 MG tablet Commonly known as:  PYRIDIUM Take 1 tablet (100 mg total) by mouth 3 (three) times daily with meals. For pain with urination   polyethylene glycol powder powder Commonly known as:  MIRALAX Take 17 g by mouth 2 (two) times daily.  Replaces:  polyethylene glycol packet   tamsulosin 0.4 MG Caps capsule Commonly known as:  FLOMAX Take 1 capsule (0.4 mg total) by mouth daily. Start taking on:  10/25/2016   traMADol 50 MG tablet Commonly known as:  ULTRAM Take 50 mg by mouth every 6 (six) hours as needed (pain).   TURMERIC PO Take 1 capsule by mouth daily.   vitamin C 1000 MG tablet Take 1,000 mg by mouth at bedtime.      Follow-up Information    Golden Circle, FNP Follow up.   Specialty:  Family Medicine Why:  as needed  Contact information: [place] [address] [phone]        Urology. Go to.   Why:  go to already scheduled appointment on Thursday         No Known Allergies  Consultations: Urology   Procedures/Studies: Ct Renal Stone Study  Result Date: 10/23/2016 CLINICAL DATA:  Lower abdominal pain and gross hematuria for 2 weeks. EXAM: CT ABDOMEN AND PELVIS WITHOUT CONTRAST TECHNIQUE: Multidetector CT imaging of the abdomen and pelvis was performed following the standard protocol without IV contrast. COMPARISON:  CT scan of October 13, 2016. FINDINGS: Lower chest: Stable calcified granuloma is noted in right lower lobe. No acute abnormality is noted. Stable moderate size sliding-type hiatal hernia. Hepatobiliary: Solitary gallstone is noted. No focal abnormality is seen in the liver on these  unenhanced images. Pancreas: Unremarkable. No pancreatic ductal dilatation or surrounding inflammatory changes. Spleen: Normal in size without focal abnormality. Adrenals/Urinary Tract: Adrenal glands are unremarkable. Stable large right renal cyst is noted. Bilateral nephrolithiasis is noted. No hydronephrosis is noted. Distal ureteral calculi cannot be excluded due to scatter artifact arising from left hip prosthesis. Foley catheter is noted in urinary bladder which is nearly completely decompressed. At least 2 bladder calculi are noted. Stomach/Bowel: There is no evidence of bowel obstruction. No inflammatory changes are noted. Patient is status post appendectomy. Vascular/Lymphatic: Aortic atherosclerosis. No enlarged abdominal or pelvic lymph nodes. Reproductive: Prostatic calcifications are noted. Other: Small fat containing ventral hernia is noted in epigastric region which is unchanged compared to prior exam. Musculoskeletal: Status post left hip arthroplasty. IMPRESSION: Stable cholelithiasis. Bilateral nonobstructive nephrolithiasis. Stable large right renal cyst. Distal ureteral calculi cannot be excluded due to scatter artifact arising from left hip prosthesis. Bladder calculi are noted. No hydronephrosis is noted. Aortic atherosclerosis. Small fat containing ventral hernia seen in epigastric region. Electronically Signed   By: Marijo Conception, M.D.   On: 10/23/2016 09:32   Ct Renal Stone Study  Result Date: 10/13/2016 CLINICAL DATA:  Acute onset of increased urinary frequency. Dysuria and hematuria. Initial encounter. EXAM: CT ABDOMEN AND PELVIS WITHOUT CONTRAST TECHNIQUE: Multidetector CT imaging of the abdomen and pelvis was performed following the standard protocol without IV contrast. COMPARISON:  Abdominal ultrasound performed 03/03/2009, and PET/CT performed 07/10/2008 FINDINGS: Lower chest: The patient is status post median sternotomy. Diffuse coronary artery calcifications are seen. A small  to moderate hiatal hernia is noted. A calcified granuloma is noted at the right lung base. Hepatobiliary: The liver is unremarkable in appearance. A stone is noted dependently within the gallbladder. The gallbladder is otherwise unremarkable. The common bile duct remains normal in caliber. Pancreas: The pancreas is within normal limits. Spleen: The spleen is unremarkable in appearance. Adrenals/Urinary Tract: The adrenal glands are unremarkable in appearance. A 10 cm cyst is noted at the upper pole of the right kidney. There is mild right-sided hydronephrosis,  with an obstructing 6 x 6 mm stone noted at the proximal to mid ureter, approximately 7 cm below the right renal pelvis. There is distention of the left ureter, with possible minimal left-sided hydronephrosis, raising question for distal obstruction, though the pelvis is not well assessed due to metal artifact. This could also reflect a recently passed stone, as a 1.0 cm stone is noted within the bladder. A few tiny bilateral nonobstructing renal stones are seen. Nonspecific perinephric stranding is noted bilaterally. Stomach/Bowel: The stomach is unremarkable in appearance. The small bowel is within normal limits. The patient is status post appendectomy. A small anterior abdominal wall hernia is noted, with associated Richter herniation of the transverse colon. The colon is grossly unremarkable in appearance. Vascular/Lymphatic: Scattered calcification is seen along the abdominal aorta and its branches. The abdominal aorta is otherwise grossly unremarkable. The inferior vena cava is grossly unremarkable. No retroperitoneal lymphadenopathy is seen. No pelvic sidewall lymphadenopathy is identified. Reproductive: The bladder is moderately distended and otherwise unremarkable. The prostate remains normal in size. Other: No additional soft tissue abnormalities are seen. Musculoskeletal: No acute osseous abnormalities are identified. The patient's left hip  arthroplasty is grossly unremarkable, though incompletely characterized. Multilevel vacuum phenomenon is noted along the lumbar spine, with underlying facet disease. The visualized musculature is unremarkable in appearance. IMPRESSION: 1. Mild right-sided hydronephrosis, with an obstructing 6 x 6 mm stone at the right proximal to mid ureter, approximately 7 cm below the right renal pelvis. 2. Distention of the left ureter, with possible minimal left-sided hydronephrosis, raising question for distal obstruction. This could also reflect a recently passed stone, as a 1.0 cm stone is noted within the bladder. Would correlate for associated symptoms. 3. Few tiny nonobstructing bilateral renal stones seen. 4. Small anterior abdominal wall hernia, with associated Richter herniation of the transverse colon. No evidence for bowel obstruction. 5. Scattered aortic atherosclerosis. 6. Mild degenerative change along the lumbar spine. 7. Cholelithiasis.  Gallbladder otherwise unremarkable. 8. Diffuse coronary artery calcifications seen. 9. Small to moderate hiatal hernia noted. Electronically Signed   By: Garald Balding M.D.   On: 10/13/2016 22:15    Subjective: Persistent back pain and dysuria.  Worried about having his catheter removed and possibly needing to have it replaced.  Denies fevers, chills.  Urine is less bloody now.    Discharge Exam: Vitals:   10/24/16 0728 10/24/16 1228  BP: (!) 141/92   Pulse: 70   Resp: 20   Temp: 99.1 F (37.3 C) 99.2 F (37.3 C)   Vitals:   10/23/16 2325 10/24/16 0304 10/24/16 0728 10/24/16 1228  BP: 114/64 123/75 (!) 141/92   Pulse: 74 67 70   Resp: 16 18 20    Temp: 98.7 F (37.1 C) 98.5 F (36.9 C) 99.1 F (37.3 C) 99.2 F (37.3 C)  TempSrc: Oral Oral Oral Oral  SpO2: 96% 97% 94%   Weight:      Height:        General:  Pt is alert, awake, not in acute distress Cardiovascular: RRR, S1/S2 +, no rubs, no gallops (Tele:  NSR with occasional PVC) Respiratory: CTA  bilaterally, no wheezing, no rhonchi Abdominal: Soft, NT, ND, bowel sounds + Extremities: no edema, no cyanosis GU:  Crusted sebaceous material around head of penis    The results of significant diagnostics from this hospitalization (including imaging, microbiology, ancillary and laboratory) are listed below for reference.     Microbiology: Recent Results (from the past 240 hour(s))  Urine culture  Status: Abnormal   Collection Time: 10/23/16  8:39 AM  Result Value Ref Range Status   Specimen Description URINE, RANDOM  Final   Special Requests NONE  Final   Culture MULTIPLE SPECIES PRESENT, SUGGEST RECOLLECTION (A)  Final   Report Status 10/24/2016 FINAL  Final  Blood culture (routine x 2)     Status: None (Preliminary result)   Collection Time: 10/23/16  8:57 AM  Result Value Ref Range Status   Specimen Description BLOOD RIGHT ANTECUBITAL  Final   Special Requests   Final    BOTTLES DRAWN AEROBIC AND ANAEROBIC Blood Culture adequate volume   Culture NO GROWTH 1 DAY  Final   Report Status PENDING  Incomplete  Blood culture (routine x 2)     Status: None (Preliminary result)   Collection Time: 10/23/16  9:03 AM  Result Value Ref Range Status   Specimen Description BLOOD LEFT ANTECUBITAL  Final   Special Requests   Final    BOTTLES DRAWN AEROBIC AND ANAEROBIC Blood Culture adequate volume   Culture NO GROWTH 1 DAY  Final   Report Status PENDING  Incomplete  MRSA PCR Screening     Status: None   Collection Time: 10/23/16  3:31 PM  Result Value Ref Range Status   MRSA by PCR NEGATIVE NEGATIVE Final    Comment:        The GeneXpert MRSA Assay (FDA approved for NASAL specimens only), is one component of a comprehensive MRSA colonization surveillance program. It is not intended to diagnose MRSA infection nor to guide or monitor treatment for MRSA infections.      Labs: BNP (last 3 results) No results for input(s): BNP in the last 8760 hours. Basic Metabolic  Panel:  Recent Labs Lab 10/23/16 0832 10/24/16 0236  NA 136 134*  K 3.8 4.0  CL 104 102  CO2 23 24  GLUCOSE 121* 135*  BUN 20 17  CREATININE 0.86 0.84  CALCIUM 9.1 8.8*   Liver Function Tests:  Recent Labs Lab 10/23/16 0832  AST 18  ALT 15*  ALKPHOS 58  BILITOT 1.0  PROT 6.3*  ALBUMIN 3.5    Recent Labs Lab 10/23/16 0832  LIPASE 33   No results for input(s): AMMONIA in the last 168 hours. CBC:  Recent Labs Lab 10/23/16 0832 10/24/16 0236  WBC 9.1 7.9  NEUTROABS 7.0  --   HGB 12.0* 11.3*  HCT 36.6* 34.2*  MCV 94.3 94.0  PLT 217 207   Cardiac Enzymes: No results for input(s): CKTOTAL, CKMB, CKMBINDEX, TROPONINI in the last 168 hours. BNP: Invalid input(s): POCBNP CBG: No results for input(s): GLUCAP in the last 168 hours. D-Dimer No results for input(s): DDIMER in the last 72 hours. Hgb A1c No results for input(s): HGBA1C in the last 72 hours. Lipid Profile No results for input(s): CHOL, HDL, LDLCALC, TRIG, CHOLHDL, LDLDIRECT in the last 72 hours. Thyroid function studies No results for input(s): TSH, T4TOTAL, T3FREE, THYROIDAB in the last 72 hours.  Invalid input(s): FREET3 Anemia work up No results for input(s): VITAMINB12, FOLATE, FERRITIN, TIBC, IRON, RETICCTPCT in the last 72 hours. Urinalysis    Component Value Date/Time   COLORURINE AMBER (A) 10/23/2016 0839   APPEARANCEUR CLOUDY (A) 10/23/2016 0839   LABSPEC 1.021 10/23/2016 0839   PHURINE 5.0 10/23/2016 0839   GLUCOSEU NEGATIVE 10/23/2016 0839   HGBUR LARGE (A) 10/23/2016 0839   BILIRUBINUR NEGATIVE 10/23/2016 [place] 10/23/2016 0839   PROTEINUR 100 (A) 10/23/2016 0174  UROBILINOGEN 0.2 11/30/2012 1544   NITRITE POSITIVE (A) 10/23/2016 0839   LEUKOCYTESUR LARGE (A) 10/23/2016 0839   Sepsis Labs Invalid input(s): PROCALCITONIN,  WBC,  LACTICIDVEN   Time coordinating discharge: Over 30 minutes  SIGNED:   Janece Canterbury, MD  [facility] 10/24/2016, 1:51 PM Pager   If 7PM-7AM, please contact night-coverage [email or web address] Password [email or web address]

## 2016-10-24 NOTE — Progress Notes (Signed)
Pt education completed to include future appointments, current prescriptions and medications, and doctors discharge instructions. Pt alert and oriented, vital signs stable. Pt discharged with nursing staff via wheel chair. Temp: 99.2 F (37.3 C) (05/06 1228) Temp Source: Oral (05/06 1228) BP: 133/99 (05/06 1230) Pulse Rate: 69 (05/06 1230)  [name] 10/24/2016 3:50 PM

## 2016-10-24 NOTE — Plan of Care (Signed)
Problem: Education: Goal: Knowledge of  General Education information/materials will improve Outcome: Progressing Explained to patient need for calling RN if need to get up for anything

## 2016-10-24 NOTE — Progress Notes (Signed)
Bladder Scan revealed 227mLs after urination. MD messaged. Waiting for orders.

## 2016-10-25 ENCOUNTER — Telehealth: Payer: Self-pay | Admitting: *Deleted

## 2016-10-25 NOTE — Telephone Encounter (Signed)
Pt was on TCM list admitted 5/5 for BPH with recent acute urinary retention requiring Foley catheter placement on 10/13/2016. Ever since his catheter was placed had ongoing symptom of dysuria. He was diagnosed with a subsequent urinary tract infection and took some antibiotics. Pt was D/C 5/6 and will be f/u w/urologist.../lmb

## 2016-10-28 LAB — CULTURE, BLOOD (ROUTINE X 2)
Culture: NO GROWTH
Culture: NO GROWTH
SPECIAL REQUESTS: ADEQUATE
Special Requests: ADEQUATE

## 2016-11-07 NOTE — Progress Notes (Signed)
Cardiology Office Note   Date:  11/08/2016   ID:  [name], DOB September 25, 1940, MRN [id number]  PCP:  Golden Circle, FNP  Cardiologist:   Dorris Carnes, MD   F/U of CAD    History of Present Illness: CAD 70 [name] is a 76 y.o. male with a history of CAD s/p PTCA x 2 then  CABG in 2014 (LIMA to OM; RIMA to RCA)  I saw him in clinic in Oct 2017  At time he had bronchitis  Pt hospitalized earlier this winter with kdney stones and UTI   Treated  Feeling some better Has some dizziness, worse than when just d/c'd  Not sure about chest   Tired   Hurts all over  DId work this weekend  Dizzy at times  Did not pass out      Current Meds  Medication Sig  . amLODipine (NORVASC) 5 MG tablet Take 0.5 tablets (2.5 mg total) by mouth daily.  . Ascorbic Acid (VITAMIN C) 1000 MG tablet Take 1,000 mg by mouth at bedtime.  Marland Kitchen aspirin EC 81 MG tablet Take 81 mg by mouth daily.  . Aspirin-Acetaminophen-Caffeine (GOODY HEADACHE PO) Take 2 packets by mouth 2 (two) times daily as needed (pain/headache).  Marland Kitchen atorvastatin (LIPITOR) 40 MG tablet Take 1 tablet (40 mg total) by mouth daily. (Patient taking differently: Take 40 mg by mouth every evening. )  . b complex vitamins tablet Take 1 tablet by mouth 2 (two) times daily.  . cholecalciferol (VITAMIN D) 1000 UNITS tablet Take 1,000 Units by mouth 2 (two) times daily.   . ciprofloxacin (CIPRO) 500 MG tablet Take 1 tablet (500 mg total) by mouth 2 (two) times daily.  . clopidogrel (PLAVIX) 75 MG tablet Take 75 mg by mouth daily.  . Coenzyme Q10 (CO Q 10) 100 MG CAPS Take 100 mg by mouth daily.   . cyanocobalamin 500 MCG tablet Take 500 mcg by mouth 2 (two) times daily. Vitamin B12  . Flaxseed, Linseed, (FLAX SEED OIL PO) Take 1 tablet by mouth 2 (two) times daily.  . folic acid (FOLVITE) 1 MG tablet Take 1 mg by mouth 2 (two) times daily.  Marland Kitchen HYDROcodone-acetaminophen (NORCO) 10-325 MG tablet Take 1 tablet by mouth every 4 (four) hours as needed  for moderate pain or severe pain.  . isosorbide mononitrate (IMDUR) 30 MG 24 hr tablet TAKE 1 TABLET(30 MG) BY MOUTH DAILY (Patient taking differently: TAKE 1 TABLET(30 MG) BY MOUTH EVERY EVENING)  . MAGNESIUM PO Take 1 tablet by mouth 2 (two) times daily.  . Misc Natural Products (OSTEO BI-FLEX JOINT SHIELD PO) Take 1 tablet by mouth 2 (two) times daily.   . Multiple Vitamin (MULTIVITAMIN WITH MINERALS) TABS tablet Take 1 tablet by mouth 2 (two) times daily. Centrum Over 50  . nitroGLYCERIN (NITROSTAT) 0.4 MG SL tablet Place 1 tablet (0.4 mg total) under the tongue every 5 (five) minutes as needed for chest pain.  . Omega-3 Fatty Acids (FISH OIL) 500 MG CAPS Take 500 mg by mouth 2 (two) times daily.  Marland Kitchen omeprazole (PRILOSEC) 40 MG capsule Take 40 mg by mouth every evening.  . pantoprazole (PROTONIX) 40 MG tablet TAKE 1 TABLET (40 MG TOTAL) BY MOUTH DAILY.  Marland Kitchen phenazopyridine (PYRIDIUM) 100 MG tablet Take 1 tablet (100 mg total) by mouth 3 (three) times daily with meals. For pain with urination  . polyethylene glycol powder (MIRALAX) powder Take 17 g by mouth 2 (two) times daily.  Marland Kitchen  tamsulosin (FLOMAX) 0.4 MG CAPS capsule Take 1 capsule (0.4 mg total) by mouth daily.  . traMADol (ULTRAM) 50 MG tablet Take 50 mg by mouth every 6 (six) hours as needed (pain).  . TURMERIC PO Take 1 capsule by mouth daily.     Allergies:   Patient has no known allergies.   Past Medical History:  Diagnosis Date  . Anginal pain (HCC)    pressure- last time 11/23/12 saw Dr Harrington Challenger  . CAD, NATIVE VESSEL 07/22/2008    a.  s/p DES to LAD in 2009; s/p repeat DES to LAD 11/2008 (prox to previous stent).  . Constipation   . DIZZINESS, CHRONIC 02/27/2009  . DJD (degenerative joint disease)   . Edema 12/05/2008  . GERD (gastroesophageal reflux disease)   . History of kidney stones   . HYPERLIPIDEMIA-MIXED 07/22/2008  . HYPERTENSION, BENIGN 07/22/2008  . Lung nodule    RLL lung nodule (PET CT 1/10 negative);R parotid hypermetabolic  on PET 11/2701  . Shortness of breath    with exertion  . Stroke [facility]) [phone]   TIA  , Left hand stinging , hurts to flex  . Testicular cancer [facility])     Past Surgical History:  Procedure Laterality Date  . APPENDECTOMY    . Bilateral orchiectomy    . CORONARY ARTERY BYPASS GRAFT N/A 12/01/2012   Procedure: CORONARY ARTERY BYPASS GRAFTING (CABG);  Surgeon: Gaye Pollack, MD;  LIMA to OM and RIMA to RCA  . ROTATOR CUFF REPAIR     right  . TONSILLECTOMY    . TOTAL HIP ARTHROPLASTY Left   . Veins stripped       Social History:  The patient  reports that he has never smoked. He has never used smokeless tobacco. He reports that he drinks alcohol. He reports that he does not use drugs.   Family History:  The patient's family history includes Aneurysm in his brother; COPD in his father; Diabetes in his mother; Heart disease in his mother; Hypertension in his mother; Kidney disease in his brother.    ROS:  Please see the history of present illness. All other systems are reviewed and  Negative to the above problem except as noted.    PHYSICAL EXAM: VS:  BP (!) 128/58   Pulse 77   Ht 6\' 1"  (1.854 m)   Wt 215 lb 6.4 oz (97.7 kg)   SpO2 98%   BMI 28.42 kg/m   GEN: Well nourished, well developed, in no acute distress  HEENT: normal  Neck: no JVD, carotid bruits, or masses Cardiac: RRR; no murmurs, rubs, or gallops,no edema  Respiratory:  clear to auscultation bilaterally, normal work of breathing GI: soft, nontender, nondistended, + BS  No hepatomegaly  MS: no deformity Moving all extremities   Skin: warm and dry, no rash Neuro:  Strength and sensation are intact Psych: euthymic mood, full affect   EKG:  EKG is not ordered today.   Lipid Panel    Component Value Date/Time   CHOL 127 04/16/2014 1546   TRIG 186.0 (H) 04/16/2014 1546   HDL 33.30 (L) 04/16/2014 1546   CHOLHDL 4 04/16/2014 1546   VLDL 37.2 04/16/2014 1546   LDLCALC 57 04/16/2014 1546      Wt Readings  from Last 3 Encounters:  11/08/16 215 lb 6.4 oz (97.7 kg)  10/23/16 220 lb 3.8 oz (99.9 kg)  10/13/16 213 lb (96.6 kg)      ASSESSMENT AND PLAN:  1  CAD  I do not think the pt is having active angina  Keep on same meds  2  Dizziness/fatigue  I encouraged him to increase hydration  Stop amlodipine  Follow symptoms    3  HL  Cotninue meds    Current medicines are reviewed at length with the patient today.  The patient does not have concerns regarding medicines.  Signed, Dorris Carnes, MD  11/08/2016 11:24 AM    [address], [place], [place]  [address] Phone: [phone]; Fax: [phone]

## 2016-11-08 ENCOUNTER — Encounter: Payer: Self-pay | Admitting: Internal Medicine

## 2016-11-08 ENCOUNTER — Ambulatory Visit (INDEPENDENT_AMBULATORY_CARE_PROVIDER_SITE_OTHER): Payer: PPO | Admitting: Internal Medicine

## 2016-11-08 ENCOUNTER — Encounter (INDEPENDENT_AMBULATORY_CARE_PROVIDER_SITE_OTHER): Payer: Self-pay

## 2016-11-08 VITALS — BP 128/58 | HR 77 | Ht 73.0 in | Wt 215.4 lb

## 2016-11-08 DIAGNOSIS — E782 Mixed hyperlipidemia: Secondary | ICD-10-CM | POA: Diagnosis not present

## 2016-11-08 DIAGNOSIS — I2510 Atherosclerotic heart disease of native coronary artery without angina pectoris: Secondary | ICD-10-CM

## 2016-11-08 DIAGNOSIS — R42 Dizziness and giddiness: Secondary | ICD-10-CM

## 2016-11-08 NOTE — Patient Instructions (Signed)
Your physician has recommended you make the following change in your medication:  1.) stop amlodipine  Increase your fluid intake.

## 2016-11-18 ENCOUNTER — Telehealth: Payer: Self-pay | Admitting: Internal Medicine

## 2016-11-18 NOTE — Telephone Encounter (Signed)
New message    Pt is calling about sending a physician statement to Dr. Harrington Challenger. He says he needs it back before Sunday.

## 2016-11-18 NOTE — Telephone Encounter (Signed)
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  Informed patient I have not seen anything for him come via fax yet and he have form faxed again in the morning.  I asked him to send to [phone].  Advised I will call him after it is completed by Dr. Harrington Challenger and will fax back.

## 2016-11-19 NOTE — Telephone Encounter (Signed)
Follow up     Pt is faxing over a letter to you , could you please watch for it

## 2016-11-19 NOTE — Telephone Encounter (Signed)
Form completed by Dr. Harrington Challenger, attempted to fax x3.  Fax filled.    Called patient to inform, he will come and pick it up.   I have left it at check in.

## 2016-11-19 NOTE — Telephone Encounter (Signed)
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

## 2016-11-30 DIAGNOSIS — Z0000 Encounter for general adult medical examination without abnormal findings: Secondary | ICD-10-CM | POA: Diagnosis not present

## 2016-12-09 NOTE — Progress Notes (Signed)
Pre visit review using our clinic review tool, if applicable. No additional management support is needed unless otherwise documented below in the visit note. 

## 2016-12-09 NOTE — Progress Notes (Addendum)
Subjective:   [name] is a 76 y.o. male who presents for an Initial Medicare Annual Wellness Visit.  Review of Systems  No ROS.  Medicare Wellness Visit. Additional risk factors are reflected in the social history.    Sleep patterns: feels rested on waking, gets up 2 times nightly to void and sleeps 7-8 hours nightly.    Home Safety/Smoke Alarms: Feels safe in home. Smoke alarms in place.  Living environment; residence and Firearm Safety: 2-story house, no firearms. Lives alone, no DME needed, good family and neighbor support Seat Belt Safety/Bike Helmet: Wears seat belt.   Counseling:   Eye Exam- appointment yearly  Dental- resources provided  Male:   CCS- Last 10/08/14, polyps, N/D regarding recall, done through [place] [facility]- No results found for: PSA    Objective:    There were no vitals filed for this visit. There is no height or weight on file to calculate BMI.  Current Medications (verified) Outpatient Encounter Prescriptions as of 12/13/2016  Medication Sig  . Ascorbic Acid (VITAMIN C) 1000 MG tablet Take 1,000 mg by mouth at bedtime.  Marland Kitchen aspirin EC 81 MG tablet Take 81 mg by mouth daily.  . Aspirin-Acetaminophen-Caffeine (GOODY HEADACHE PO) Take 2 packets by mouth 2 (two) times daily as needed (pain/headache).  Marland Kitchen atorvastatin (LIPITOR) 40 MG tablet Take 1 tablet (40 mg total) by mouth daily. (Patient taking differently: Take 40 mg by mouth every evening. )  . b complex vitamins tablet Take 1 tablet by mouth 2 (two) times daily.  . cholecalciferol (VITAMIN D) 1000 UNITS tablet Take 1,000 Units by mouth 2 (two) times daily.   . ciprofloxacin (CIPRO) 500 MG tablet Take 1 tablet (500 mg total) by mouth 2 (two) times daily.  . clopidogrel (PLAVIX) 75 MG tablet Take 75 mg by mouth daily.  . Coenzyme Q10 (CO Q 10) 100 MG CAPS Take 100 mg by mouth daily.   . cyanocobalamin 500 MCG tablet Take 500 mcg by mouth 2 (two) times daily. Vitamin B12  . Flaxseed, Linseed, (FLAX  SEED OIL PO) Take 1 tablet by mouth 2 (two) times daily.  . folic acid (FOLVITE) 1 MG tablet Take 1 mg by mouth 2 (two) times daily.  Marland Kitchen HYDROcodone-acetaminophen (NORCO) 10-325 MG tablet Take 1 tablet by mouth every 4 (four) hours as needed for moderate pain or severe pain.  . isosorbide mononitrate (IMDUR) 30 MG 24 hr tablet TAKE 1 TABLET(30 MG) BY MOUTH DAILY (Patient taking differently: TAKE 1 TABLET(30 MG) BY MOUTH EVERY EVENING)  . MAGNESIUM PO Take 1 tablet by mouth 2 (two) times daily.  . Misc Natural Products (OSTEO BI-FLEX JOINT SHIELD PO) Take 1 tablet by mouth 2 (two) times daily.   . Multiple Vitamin (MULTIVITAMIN WITH MINERALS) TABS tablet Take 1 tablet by mouth 2 (two) times daily. Centrum Over 50  . nitroGLYCERIN (NITROSTAT) 0.4 MG SL tablet Place 1 tablet (0.4 mg total) under the tongue every 5 (five) minutes as needed for chest pain.  . Omega-3 Fatty Acids (FISH OIL) 500 MG CAPS Take 500 mg by mouth 2 (two) times daily.  Marland Kitchen omeprazole (PRILOSEC) 40 MG capsule Take 40 mg by mouth every evening.  . pantoprazole (PROTONIX) 40 MG tablet TAKE 1 TABLET (40 MG TOTAL) BY MOUTH DAILY.  Marland Kitchen phenazopyridine (PYRIDIUM) 100 MG tablet Take 1 tablet (100 mg total) by mouth 3 (three) times daily with meals. For pain with urination  . polyethylene glycol powder (MIRALAX) powder Take 17 g  by mouth 2 (two) times daily.  . tamsulosin (FLOMAX) 0.4 MG CAPS capsule Take 1 capsule (0.4 mg total) by mouth daily.  . traMADol (ULTRAM) 50 MG tablet Take 50 mg by mouth every 6 (six) hours as needed (pain).  . TURMERIC PO Take 1 capsule by mouth daily.   No facility-administered encounter medications on file as of 12/13/2016.     Allergies (verified) Patient has no known allergies.   History: Past Medical History:  Diagnosis Date  . Anginal pain (HCC)    pressure- last time 11/23/12 saw Dr Harrington Challenger  . CAD, NATIVE VESSEL 07/22/2008    a.  s/p DES to LAD in 2009; s/p repeat DES to LAD 11/2008 (prox to previous  stent).  . Constipation   . DIZZINESS, CHRONIC 02/27/2009  . DJD (degenerative joint disease)   . Edema 12/05/2008  . GERD (gastroesophageal reflux disease)   . History of kidney stones   . HYPERLIPIDEMIA-MIXED 07/22/2008  . HYPERTENSION, BENIGN 07/22/2008  . Lung nodule    RLL lung nodule (PET CT 1/10 negative);R parotid hypermetabolic on PET 10/3974  . Shortness of breath    with exertion  . Stroke [facility] [phone]   TIA  , Left hand stinging , hurts to flex  . Testicular cancer [facility])    Past Surgical History:  Procedure Laterality Date  . APPENDECTOMY    . Bilateral orchiectomy    . CORONARY ARTERY BYPASS GRAFT N/A 12/01/2012   Procedure: CORONARY ARTERY BYPASS GRAFTING (CABG);  Surgeon: Gaye Pollack, MD;  LIMA to OM and RIMA to RCA  . ROTATOR CUFF REPAIR     right  . TONSILLECTOMY    . TOTAL HIP ARTHROPLASTY Left   . Veins stripped     Family History  Problem Relation Age of Onset  . Aneurysm Brother   . Kidney disease Brother   . Heart disease Mother   . Hypertension Mother   . Diabetes Mother   . COPD Father   . Heart attack Neg Hx   . Stroke Neg Hx    Social History   Occupational History  . Event planner    . Security guard    Social History Main Topics  . Smoking status: Never Smoker  . Smokeless tobacco: Never Used  . Alcohol use Yes     Comment: rare  . Drug use: No  . Sexual activity: Not Currently   Tobacco Counseling Counseling given: Not Answered   Activities of Daily Living In your present state of health, do you have any difficulty performing the following activities: 10/23/2016  Hearing? N  Vision? N  Difficulty concentrating or making decisions? N  Walking or climbing stairs? Y  Dressing or bathing? N  Doing errands, shopping? N  Some recent data might be hidden    Immunizations and Health Maintenance  There is no immunization history on file for this patient. Health Maintenance Due  Topic Date Due  . TETANUS/TDAP  01/26/1959  . PNA  vac Low Risk Adult (1 of 2 - PCV13) 01/25/2005    Patient Care Team: Golden Circle, FNP as PCP - General (Family Medicine) Martinique, Peter M, MD as Attending Physician (Cardiology) Fay Records, MD as Attending Physician (Cardiology)  Indicate any recent Medical Services you may have received from other than [facility] providers in the past year (date may be approximate).    Assessment:   This is a routine wellness examination for [name]. Physical assessment deferred to PCP.   Hearing/Vision  screen No exam data present  Dietary issues and exercise activities discussed:   Diet (meal preparation, eat out, water intake, caffeinated beverages, dairy products, fruits and vegetables): in general, a "healthy" diet  , well balanced, low fat/ cholesterol, low salt eats a variety of fruits and vegetables daily, limits salt, fat/cholesterol, sugar, caffeine, drinks 6-8 glasses of water daily.   Goals    None     Depression Screen PHQ 2/9 Scores 01/03/2013 01/01/2013  PHQ - 2 Score 0 0    Fall Risk No flowsheet data found.  Cognitive Function:       Ad8 score reviewed for issues:  Issues making decisions: no  Less interest in hobbies / activities: no  Repeats questions, stories (family complaining): no  Trouble using ordinary gadgets (microwave, computer, phone):no  Forgets the month or year: no  Mismanaging finances: no  Remembering appts: no  Daily problems with thinking and/or memory: no Ad8 score is= 0  Screening Tests Health Maintenance  Topic Date Due  . TETANUS/TDAP  01/26/1959  . PNA vac Low Risk Adult (1 of 2 - PCV13) 01/25/2005  . INFLUENZA VACCINE  01/19/2017        Plan:    Continue doing brain stimulating activities (puzzles, reading, adult coloring books, staying active) to keep memory sharp.   Continue to eat heart healthy diet (full of fruits, vegetables, whole grains, lean protein, water--limit salt, fat, and sugar intake) and increase physical  activity as tolerated.   I have personally reviewed and noted the following in the patient's chart:   . Medical and social history . Use of alcohol, tobacco or illicit drugs  . Current medications and supplements . Functional ability and status . Nutritional status . Physical activity . Advanced directives . List of other physicians . Vitals . Screenings to include cognitive, depression, and falls . Referrals and appointments  In addition, I have reviewed and discussed with patient certain preventive protocols, quality metrics, and best practice recommendations. A written personalized care plan for preventive services as well as general preventive health recommendations were provided to patient.     Michiel Cowboy, RN   12/09/2016    Medical screening examination/treatment/procedure(s) were performed by non-physician practitioner and as supervising provider I was immediately available for consultation/collaboration. I agree with treatment plan. Mauricio Po, FNP

## 2016-12-13 ENCOUNTER — Ambulatory Visit (INDEPENDENT_AMBULATORY_CARE_PROVIDER_SITE_OTHER): Payer: PPO | Admitting: *Deleted

## 2016-12-13 ENCOUNTER — Telehealth: Payer: Self-pay | Admitting: *Deleted

## 2016-12-13 ENCOUNTER — Other Ambulatory Visit: Payer: Self-pay | Admitting: Family

## 2016-12-13 VITALS — BP 110/68 | HR 78 | Resp 20 | Ht 73.0 in | Wt 210.0 lb

## 2016-12-13 DIAGNOSIS — Z23 Encounter for immunization: Secondary | ICD-10-CM

## 2016-12-13 DIAGNOSIS — Z0000 Encounter for general adult medical examination without abnormal findings: Secondary | ICD-10-CM

## 2016-12-13 MED ORDER — TAMSULOSIN HCL 0.4 MG PO CAPS: 0.4000 mg | ORAL_CAPSULE | Freq: Every day | ORAL | 0 refills | Status: DC

## 2016-12-13 MED ORDER — TRAMADOL HCL 50 MG PO TABS: 50.0000 mg | ORAL_TABLET | Freq: Four times a day (QID) | ORAL | 0 refills | Status: DC | PRN

## 2016-12-13 NOTE — Telephone Encounter (Signed)
Medications refilled and will be faxed.

## 2016-12-13 NOTE — Telephone Encounter (Signed)
During AWV, patient requested refills for tramadol and flomax. Tramadol is prescribed through [place], patient states he is having difficulty getting an appointment, he takes this for back pain. Flomax was prescribed at discharge from hospital admission for pyelonephritis  10/23/16. Patient states this has helped with his his frequency issues. Patient's last visit with PCP was 05/11/16, it was explained to the patient that he may need to make a PCP appointment.

## 2016-12-13 NOTE — Patient Instructions (Addendum)
Continue doing brain stimulating activities (puzzles, reading, adult coloring books, staying active) to keep memory sharp.   Continue to eat heart healthy diet (full of fruits, vegetables, whole grains, lean protein, water--limit salt, fat, and sugar intake) and increase physical activity as tolerated.   [name] , Thank you for taking time to come for your Medicare Wellness Visit. I appreciate your ongoing commitment to your health goals. Please review the following plan we discussed and let me know if I can assist you in the future.   These are the goals we discussed: Goals    . 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 Taking my tiny home with me.       This is a list of the screening recommended for you and due dates:  Health Maintenance  Topic Date Due  . Tetanus Vaccine  01/26/1960  . Pneumonia vaccines (1 of 2 - PCV13) 01/25/2005  . Flu Shot  01/19/2017   Pneumococcal Conjugate Vaccine (PCV13) What You Need to Know 1. Why get vaccinated? Vaccination can protect both children and adults from pneumococcal disease. Pneumococcal disease is caused by bacteria that can spread from person to person through close contact. It can cause ear infections, and it can also lead to more serious infections of the:  Lungs (pneumonia),  Blood (bacteremia), and  Covering of the brain and spinal cord (meningitis).  Pneumococcal pneumonia is most common among adults. Pneumococcal meningitis can cause deafness and brain damage, and it kills about 1 child in 10 who get it. Anyone can get pneumococcal disease, but children under 73 years of age and adults 50 years and older, people with certain medical conditions, and cigarette smokers are at the highest risk. Before there was a vaccine, the [place] saw:  more than 700 cases of meningitis,  about 13,000 blood infections,  about 5 million ear infections, and  about 200 deaths  in  children under 5 each year from pneumococcal disease. Since vaccine became available, severe pneumococcal disease in these children has fallen by 88%. About 18,000 older adults die of pneumococcal disease each year in the [place]. Treatment of pneumococcal infections with penicillin and other drugs is not as effective as it used to be, because some strains of the disease have become resistant to these drugs. This makes prevention of the disease, through vaccination, even more important. 2. PCV13 vaccine Pneumococcal conjugate vaccine (called PCV13) protects against 13 types of pneumococcal bacteria. PCV13 is routinely given to children at 2, 4, 6, and 38-60 months of age. It is also recommended for children and adults 71 to 45 years of age with certain health conditions, and for all adults 34 years of age and older. Your doctor can give you details. 3. Some people should not get this vaccine Anyone who has ever had a life-threatening allergic reaction to a dose of this vaccine, to an earlier pneumococcal vaccine called PCV7, or to any vaccine containing diphtheria toxoid (for example, DTaP), should not get PCV13. Anyone with a severe allergy to any component of PCV13 should not get the vaccine. Tell your doctor if the person being vaccinated has any severe allergies. If the person scheduled for vaccination is not feeling well, your healthcare provider might decide to reschedule the shot on another day. 4. Risks of a vaccine reaction With any medicine, including vaccines, there is a chance of reactions. These are usually mild and go away on their own, but serious reactions  are also possible. Problems reported following PCV13 varied by age and dose in the series. The most common problems reported among children were:  About half became drowsy after the shot, had a temporary loss of appetite, or had redness or tenderness where the shot was given.  About 1 out of 3 had swelling where the shot was  given.  About 1 out of 3 had a mild fever, and about 1 in 20 had a fever over 102.61F.  Up to about 8 out of 10 became fussy or irritable.  Adults have reported pain, redness, and swelling where the shot was given; also mild fever, fatigue, headache, chills, or muscle pain. Young children who get PCV13 along with inactivated flu vaccine at the same time may be at increased risk for seizures caused by fever. Ask your doctor for more information. Problems that could happen after any vaccine:  People sometimes faint after a medical procedure, including vaccination. Sitting or lying down for about 15 minutes can help prevent fainting, and injuries caused by a fall. Tell your doctor if you feel dizzy, or have vision changes or ringing in the ears.  Some older children and adults get severe pain in the shoulder and have difficulty moving the arm where a shot was given. This happens very rarely.  Any medication can cause a severe allergic reaction. Such reactions from a vaccine are very rare, estimated at about 1 in a million doses, and would happen within a few minutes to a few hours after the vaccination. As with any medicine, there is a very small chance of a vaccine causing a serious injury or death. The safety of vaccines is always being monitored. For more information, visit: http://www.aguilar.org/ 5. What if there is a serious reaction? What should I look for? Look for anything that concerns you, such as signs of a severe allergic reaction, very high fever, or unusual behavior. Signs of a severe allergic reaction can include hives, swelling of the face and throat, difficulty breathing, a fast heartbeat, dizziness, and weakness-usually within a few minutes to a few hours after the vaccination. What should I do?  If you think it is a severe allergic reaction or other emergency that can't wait, call 9-1-1 or get the person to the nearest hospital. Otherwise, call your doctor.  Reactions  should be reported to the Vaccine Adverse Event Reporting System (VAERS). Your doctor should file this report, or you can do it yourself through the VAERS web site at www.vaers.SamedayNews.es, or by calling 762-072-3080. ? VAERS does not give medical advice. 6. The National Vaccine Injury Compensation Program The [employer] Vaccine Injury Compensation Program (VICP) is a federal program that was created to compensate people who may have been injured by certain vaccines. Persons who believe they may have been injured by a vaccine can learn about the program and about filing a claim by calling 631-068-2618 or visiting the [employer] website at GoldCloset.com.ee. There is a time limit to file a claim for compensation. 7. How can I learn more?  Ask your healthcare provider. He or she can give you the vaccine package insert or suggest other sources of information.  Call your local or state health department.  Contact the Centers for Disease Control and Prevention (CDC): ? Call 3235831550 (1-800-CDC-INFO) or ? Visit CDC's website at http://hunter.com/ Vaccine Information Statement, PCV13 Vaccine (04/25/2014) This information is not intended to replace advice given to you by your health care provider. Make sure you discuss any questions you have with  your health care provider. Document Released: 04/04/2006 Document Revised: 02/26/2016 Document Reviewed: 02/26/2016 Elsevier Interactive Patient Education  2017 [employer].

## 2016-12-14 NOTE — Telephone Encounter (Signed)
Medication has been faxed

## 2017-01-10 ENCOUNTER — Other Ambulatory Visit: Payer: Self-pay | Admitting: Family

## 2017-01-10 DIAGNOSIS — K2100 Gastro-esophageal reflux disease with esophagitis, without bleeding: Secondary | ICD-10-CM

## 2017-01-10 NOTE — Telephone Encounter (Signed)
error 

## 2017-03-21 ENCOUNTER — Other Ambulatory Visit: Payer: Self-pay

## 2017-03-21 DIAGNOSIS — K2100 Gastro-esophageal reflux disease with esophagitis, without bleeding: Secondary | ICD-10-CM

## 2017-03-21 MED ORDER — PANTOPRAZOLE SODIUM 40 MG PO TBEC: DELAYED_RELEASE_TABLET | ORAL | 0 refills | Status: DC

## 2017-03-23 ENCOUNTER — Other Ambulatory Visit: Payer: Self-pay | Admitting: Family

## 2017-03-23 DIAGNOSIS — K2100 Gastro-esophageal reflux disease with esophagitis, without bleeding: Secondary | ICD-10-CM

## 2017-05-13 ENCOUNTER — Emergency Department (HOSPITAL_COMMUNITY): Payer: PPO

## 2017-05-13 ENCOUNTER — Encounter (HOSPITAL_COMMUNITY): Payer: Self-pay | Admitting: Emergency Medicine

## 2017-05-13 ENCOUNTER — Other Ambulatory Visit: Payer: Self-pay

## 2017-05-13 ENCOUNTER — Emergency Department (HOSPITAL_COMMUNITY)
Admission: EM | Admit: 2017-05-13 | Discharge: 2017-05-13 | Disposition: A | Discharge status: Home / Self Care | Payer: PPO | Attending: Emergency Medicine | Admitting: Emergency Medicine

## 2017-05-13 DIAGNOSIS — Z79899 Other long term (current) drug therapy: Secondary | ICD-10-CM | POA: Insufficient documentation

## 2017-05-13 DIAGNOSIS — R05 Cough: Secondary | ICD-10-CM | POA: Diagnosis not present

## 2017-05-13 DIAGNOSIS — Z951 Presence of aortocoronary bypass graft: Secondary | ICD-10-CM | POA: Diagnosis not present

## 2017-05-13 DIAGNOSIS — Z8673 Personal history of transient ischemic attack (TIA), and cerebral infarction without residual deficits: Secondary | ICD-10-CM | POA: Insufficient documentation

## 2017-05-13 DIAGNOSIS — I10 Essential (primary) hypertension: Secondary | ICD-10-CM | POA: Diagnosis not present

## 2017-05-13 DIAGNOSIS — Z7982 Long term (current) use of aspirin: Secondary | ICD-10-CM | POA: Insufficient documentation

## 2017-05-13 DIAGNOSIS — R059 Cough, unspecified: Secondary | ICD-10-CM

## 2017-05-13 MED ORDER — IPRATROPIUM BROMIDE 0.02 % IN SOLN
0.5000 mg | Freq: Once | RESPIRATORY_TRACT | Status: AC
  Administered 2017-05-13: 0.5 mg via RESPIRATORY_TRACT
  Filled 2017-05-13: qty 2.5

## 2017-05-13 MED ORDER — ALBUTEROL SULFATE (2.5 MG/3ML) 0.083% IN NEBU
5.0000 mg | INHALATION_SOLUTION | Freq: Once | RESPIRATORY_TRACT | Status: AC
  Administered 2017-05-13: 5 mg via RESPIRATORY_TRACT
  Filled 2017-05-13: qty 6

## 2017-05-13 MED ORDER — HYDROCODONE-ACETAMINOPHEN 5-325 MG PO TABS: 1.0000 | ORAL_TABLET | ORAL | 0 refills | Status: DC | PRN

## 2017-05-13 NOTE — ED Provider Notes (Signed)
[facility] Provider Note   CSN: [id number] Arrival date & time: 05/13/17  0346     History   Chief Complaint Chief Complaint  Patient presents with  . Cough    HPI [name] is a 76 y.o. male.  Patient with a history of HTN, HLD, CAD presents with c/o cough, persistent over the last 1 week. No fever, chest pain, significant congestion or fever. He reports the cough is productive of "orange" phlegm. He is a non-smoker. No vomiting. No sick contacts. He denies generalized body aches or night sweats.    The history is provided by the patient. No language interpreter was used.  Cough  Pertinent negatives include no chest pain, no chills and no myalgias.    Past Medical History:  Diagnosis Date  . Anginal pain (HCC)    pressure- last time 11/23/12 saw Dr Harrington Challenger  . CAD, NATIVE VESSEL 07/22/2008    a.  s/p DES to LAD in 2009; s/p repeat DES to LAD 11/2008 (prox to previous stent).  . Constipation   . DIZZINESS, CHRONIC 02/27/2009  . DJD (degenerative joint disease)   . Edema 12/05/2008  . GERD (gastroesophageal reflux disease)   . History of kidney stones   . HYPERLIPIDEMIA-MIXED 07/22/2008  . HYPERTENSION, BENIGN 07/22/2008  . Lung nodule    RLL lung nodule (PET CT 1/10 negative);R parotid hypermetabolic on PET 0/2409  . Shortness of breath    with exertion  . Stroke [facility]) [phone]   TIA  , Left hand stinging , hurts to flex  . Testicular cancer [facility])     Patient Active Problem List   Diagnosis Date Noted  . Hematuria 10/23/2016  . UTI (urinary tract infection) 10/23/2016  . Nephrolithiasis 10/23/2016  . Cough 05/11/2016  . GERD (gastroesophageal reflux disease) 05/11/2016  . Dizziness, nonspecific 10/11/2014  . 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 DIZZINESS, CHRONIC 02/27/2009  . ABDOMINAL PAIN 02/27/2009  . ABDOMINAL PAIN, GENERALIZED 02/27/2009  . EDEMA 12/05/2008  . CHEST PAIN 11/25/2008  . NECK PAIN 11/08/2008  . Shortness of  breath 11/08/2008  . Mixed hyperlipidemia 07/22/2008  . HYPERTENSION, BENIGN 07/22/2008  . CAD, NATIVE VESSEL 07/22/2008    Past Surgical History:  Procedure Laterality Date  . APPENDECTOMY    . Bilateral orchiectomy    . CORONARY ARTERY BYPASS GRAFT N/A 12/01/2012   Procedure: CORONARY ARTERY BYPASS GRAFTING (CABG);  Surgeon: Gaye Pollack, MD;  LIMA to OM and RIMA to RCA  . KIDNEY STONE SURGERY  10/19/2016  . ROTATOR CUFF REPAIR     right  . TONSILLECTOMY    . TOTAL HIP ARTHROPLASTY Left   . Veins stripped         Home Medications    Prior to Admission medications   Medication Sig Start Date End Date Taking? Authorizing Provider  Ascorbic Acid (VITAMIN C) 1000 MG tablet Take 1,000 mg by mouth at bedtime.    [provider]  aspirin EC 81 MG tablet Take 81 mg by mouth daily.    [provider]  Aspirin-Acetaminophen-Caffeine (GOODY HEADACHE PO) Take 2 packets by mouth 2 (two) times daily as needed (pain/headache).    [provider]  atorvastatin (LIPITOR) 40 MG tablet Take 1 tablet (40 mg total) by mouth daily. Patient taking differently: Take 40 mg by mouth every evening.  01/22/14   Fay Records, MD  b complex vitamins tablet Take 1 tablet by mouth 2 (two) times  daily.    [provider]  cholecalciferol (VITAMIN D) 1000 UNITS tablet Take 1,000 Units by mouth 2 (two) times daily.     [provider]  clopidogrel (PLAVIX) 75 MG tablet Take 75 mg by mouth daily.    [provider]  Coenzyme Q10 (CO Q 10) 100 MG CAPS Take 100 mg by mouth daily.     [provider]  cyanocobalamin 500 MCG tablet Take 500 mcg by mouth 2 (two) times daily. Vitamin B12    [provider]  Flaxseed, Linseed, (FLAX SEED OIL PO) Take 1 tablet by mouth 2 (two) times daily.    [provider]  folic acid (FOLVITE) 1 MG tablet Take 1 mg by mouth 2 (two) times daily.    [provider]  isosorbide mononitrate (IMDUR)  30 MG 24 hr tablet TAKE 1 TABLET(30 MG) BY MOUTH DAILY Patient taking differently: TAKE 1 TABLET(30 MG) BY MOUTH EVERY EVENING 10/18/16   Fay Records, MD  MAGNESIUM PO Take 1 tablet by mouth 2 (two) times daily.    [provider]  Misc Natural Products (OSTEO BI-FLEX JOINT SHIELD PO) Take 1 tablet by mouth 2 (two) times daily.     [provider]  Multiple Vitamin (MULTIVITAMIN WITH MINERALS) TABS tablet Take 1 tablet by mouth 2 (two) times daily. Centrum Over 50    [provider]  nitroGLYCERIN (NITROSTAT) 0.4 MG SL tablet Place 1 tablet (0.4 mg total) under the tongue every 5 (five) minutes as needed for chest pain. 08/08/15   Fay Records, MD  Omega-3 Fatty Acids (FISH OIL) 500 MG CAPS Take 500 mg by mouth 2 (two) times daily.    [provider]  omeprazole (PRILOSEC) 40 MG capsule Take 40 mg by mouth every evening.    [provider]  pantoprazole (PROTONIX) 40 MG tablet Take 1 tablet (40 mg) by mouth daily. Needs office visit for more refills. 03/21/17   Golden Circle, FNP  polyethylene glycol powder (MIRALAX) powder Take 17 g by mouth 2 (two) times daily. 10/24/16   Janece Canterbury, MD  tamsulosin (FLOMAX) 0.4 MG CAPS capsule Take 1 capsule (0.4 mg total) by mouth daily. 12/13/16   Golden Circle, FNP  traMADol (ULTRAM) 50 MG tablet Take 1 tablet (50 mg total) by mouth every 6 (six) hours as needed (pain). 12/13/16   Golden Circle, FNP  TURMERIC PO Take 1 capsule by mouth daily.    [provider]    Family History Family History  Problem Relation Age of Onset  . Aneurysm Brother   . Kidney disease Brother   . Heart disease Mother   . Hypertension Mother   . Diabetes Mother   . COPD Father   . Heart attack Neg Hx   . Stroke Neg Hx     Social History Social History   Tobacco Use  . Smoking status: Never Smoker  . Smokeless tobacco: Never Used  Substance Use Topics  . Alcohol use: Yes    Comment: rare  . Drug  use: No     Allergies   Patient has no known allergies.   Review of Systems Review of Systems  Constitutional: Negative for chills and fever.  HENT: Negative.   Respiratory: Positive for cough.   Cardiovascular: Negative.  Negative for chest pain.  Gastrointestinal: Negative.  Negative for abdominal pain and vomiting.  Musculoskeletal: Negative.  Negative for myalgias.  Skin: Negative.   Neurological: Negative.  Physical Exam Updated Vital Signs BP 140/87   Pulse 77   Temp 98.4 F (36.9 C) (Oral)   Resp 18   Ht 6' (1.829 m)   Wt 97.1 kg (214 lb)   SpO2 100%   BMI 29.02 kg/m   Physical Exam  Constitutional: He is oriented to person, place, and time. He appears well-developed and well-nourished.  HENT:  Head: Normocephalic.  Mouth/Throat: Oropharynx is clear and moist.  Neck: Normal range of motion. Neck supple.  Cardiovascular: Normal rate and regular rhythm.  Pulmonary/Chest: Effort normal and breath sounds normal.  Actively coughing throughout exam.   Abdominal: Soft. Bowel sounds are normal. There is no tenderness. There is no rebound and no guarding.  Musculoskeletal: Normal range of motion. He exhibits no edema.  Neurological: He is alert and oriented to person, place, and time.  Skin: Skin is warm and dry. No rash noted.  Psychiatric: He has a normal mood and affect.     ED Treatments / Results  Labs (all labs ordered are listed, but only abnormal results are displayed) Labs Reviewed - No data to display  EKG  EKG Interpretation None       Radiology Dg Chest 2 View  Result Date: 05/13/2017 CLINICAL DATA:  Cough for 1 week. Nonsmoker. Stroke and hypertension. EXAM: CHEST  2 VIEW COMPARISON:  05/11/2016 FINDINGS: Postoperative changes in the mediastinum. Shallow inspiration with linear atelectasis in the lung bases. Heart size and pulmonary vascularity are normal. No airspace disease or consolidation. No blunting of costophrenic angles. No  pneumothorax. Calcification of the aorta. IMPRESSION: Shallow inspiration with linear atelectasis in the lung bases. Aortic atherosclerosis. Electronically Signed   By: Lucienne Capers M.D.   On: 05/13/2017 05:13    Procedures Procedures (including critical care time)  Medications Ordered in ED Medications  albuterol (PROVENTIL) (2.5 MG/3ML) 0.083% nebulizer solution 5 mg (5 mg Nebulization Given 05/13/17 0607)  ipratropium (ATROVENT) nebulizer solution 0.5 mg (0.5 mg Nebulization Given 05/13/17 0607)     Initial Impression / Assessment and Plan / ED Course  I have reviewed the triage vital signs and the nursing notes.  Pertinent labs & imaging results that were available during my care of the patient were reviewed by me and considered in my medical decision making (see chart for details).     Patient presents with cough for one week, productive,no fever or pain.   CXR clear, no infiltrates. VSS, no tachypnea, tachycardia or hypoxia. He is well appearing.   Albuterol/atrovent nebulizer given to attempt control of cough which did not appreciably help. Will give Norco for cough control and encourage follow up with PCP.   Final Clinical Impressions(s) / ED Diagnoses   Final diagnoses:  None   1. Cough  ED Discharge Orders    None       Charlann Lange, PA-C 40/08/67 [id number]    Delora Fuel, MD 09/32/67 [phone]

## 2017-05-13 NOTE — ED Triage Notes (Signed)
Pt reports cough approx 2 weeks ago shortly after returning from mission trip to [place]. Pt reports headaches and some abdominal pain. Thing orange phlegm per pt.

## 2017-05-13 NOTE — Discharge Instructions (Signed)
Return to the emergency department if you have any worsening symptoms: high fever, onset of pain or new concern.

## 2017-06-07 ENCOUNTER — Encounter (HOSPITAL_COMMUNITY): Payer: Self-pay | Admitting: *Deleted

## 2017-06-07 ENCOUNTER — Emergency Department (HOSPITAL_COMMUNITY)
Admission: EM | Admit: 2017-06-07 | Discharge: 2017-06-07 | Disposition: A | Discharge status: Home / Self Care | Payer: Non-veteran care | Attending: Emergency Medicine | Admitting: Emergency Medicine

## 2017-06-07 ENCOUNTER — Emergency Department (HOSPITAL_COMMUNITY): Payer: Non-veteran care

## 2017-06-07 DIAGNOSIS — Z79899 Other long term (current) drug therapy: Secondary | ICD-10-CM | POA: Insufficient documentation

## 2017-06-07 DIAGNOSIS — Y999 Unspecified external cause status: Secondary | ICD-10-CM | POA: Diagnosis not present

## 2017-06-07 DIAGNOSIS — W19XXXA Unspecified fall, initial encounter: Secondary | ICD-10-CM

## 2017-06-07 DIAGNOSIS — Z7982 Long term (current) use of aspirin: Secondary | ICD-10-CM | POA: Insufficient documentation

## 2017-06-07 DIAGNOSIS — Y92008 Other place in unspecified non-institutional (private) residence as the place of occurrence of the external cause: Secondary | ICD-10-CM | POA: Insufficient documentation

## 2017-06-07 DIAGNOSIS — M25562 Pain in left knee: Secondary | ICD-10-CM | POA: Diagnosis not present

## 2017-06-07 DIAGNOSIS — M25511 Pain in right shoulder: Secondary | ICD-10-CM | POA: Diagnosis not present

## 2017-06-07 DIAGNOSIS — Z951 Presence of aortocoronary bypass graft: Secondary | ICD-10-CM | POA: Diagnosis not present

## 2017-06-07 DIAGNOSIS — S8992XA Unspecified injury of left lower leg, initial encounter: Secondary | ICD-10-CM | POA: Diagnosis not present

## 2017-06-07 DIAGNOSIS — R51 Headache: Secondary | ICD-10-CM | POA: Diagnosis not present

## 2017-06-07 DIAGNOSIS — Z96642 Presence of left artificial hip joint: Secondary | ICD-10-CM | POA: Diagnosis not present

## 2017-06-07 DIAGNOSIS — R519 Headache, unspecified: Secondary | ICD-10-CM

## 2017-06-07 DIAGNOSIS — Y9389 Activity, other specified: Secondary | ICD-10-CM | POA: Diagnosis not present

## 2017-06-07 DIAGNOSIS — W108XXA Fall (on) (from) other stairs and steps, initial encounter: Secondary | ICD-10-CM | POA: Diagnosis not present

## 2017-06-07 DIAGNOSIS — S79922A Unspecified injury of left thigh, initial encounter: Secondary | ICD-10-CM | POA: Diagnosis present

## 2017-06-07 DIAGNOSIS — I10 Essential (primary) hypertension: Secondary | ICD-10-CM | POA: Diagnosis not present

## 2017-06-07 DIAGNOSIS — I2510 Atherosclerotic heart disease of native coronary artery without angina pectoris: Secondary | ICD-10-CM | POA: Insufficient documentation

## 2017-06-07 DIAGNOSIS — S4991XA Unspecified injury of right shoulder and upper arm, initial encounter: Secondary | ICD-10-CM | POA: Diagnosis not present

## 2017-06-07 DIAGNOSIS — S7012XA Contusion of left thigh, initial encounter: Secondary | ICD-10-CM | POA: Diagnosis not present

## 2017-06-07 DIAGNOSIS — S0990XA Unspecified injury of head, initial encounter: Secondary | ICD-10-CM | POA: Diagnosis not present

## 2017-06-07 DIAGNOSIS — Z7902 Long term (current) use of antithrombotics/antiplatelets: Secondary | ICD-10-CM | POA: Insufficient documentation

## 2017-06-07 DIAGNOSIS — S199XXA Unspecified injury of neck, initial encounter: Secondary | ICD-10-CM | POA: Diagnosis not present

## 2017-06-07 MED ORDER — IBUPROFEN 800 MG PO TABS
800.0000 mg | ORAL_TABLET | Freq: Once | ORAL | Status: AC
  Administered 2017-06-07: 800 mg via ORAL
  Filled 2017-06-07: qty 1

## 2017-06-07 MED ORDER — TRAMADOL HCL 50 MG PO TABS: 50.0000 mg | ORAL_TABLET | Freq: Four times a day (QID) | ORAL | 0 refills | Status: DC | PRN

## 2017-06-07 NOTE — ED Notes (Signed)
Pt verbalizes understanding of d/c instructions. Pt received prescriptions. Pt ambulatory at d/c with all belongings.  

## 2017-06-07 NOTE — ED Triage Notes (Signed)
Pt reports he was sent here by his Cardiologist d/t continuous h/a after a fall a week ago.  He reports he tripped and fell, hitting his head on concrete.  He has been having  A h/a since but denies any dizziness or n/v, or disorientation.  He was sent here for a head CT.  He is on plavix.  Pt is A&Ox 4.  Pt reports L thigh and knee pain.  Otherwise, ambulatory without difficulty.  He is not certain if he lost consciousness or not.

## 2017-06-07 NOTE — ED Notes (Signed)
Called [facility] to re-check vitals; No response

## 2017-06-07 NOTE — ED Notes (Signed)
Patient transported to X-ray 

## 2017-06-07 NOTE — ED Notes (Signed)
Called [facility] to recheck vitals-no response 

## 2017-06-07 NOTE — ED Provider Notes (Signed)
[facility] EMERGENCY DEPARTMENT Provider Note   CSN: [id number] Arrival date & time: 06/07/17  1444     History   Chief Complaint Chief Complaint  Patient presents with  . Fall  . Headache    HPI [name] is a 76 y.o. male.  HPI   76 year old male with history of CAD, status post CABG x2, hypertension, chronic dizziness sent here from the [place] for evaluation of headache.  Patient report 4 days ago he was trying to carry a Christmas tree up the steps.  The tree got caught on the stairwell and patient lost balance and fell.  He report hitting his head and unable to recall if he had any loss of consciousness.  He did injure his right shoulder, and left thigh from the fall.  He has been having a persistent headache since.  He describes headache as a throbbing sensation across the forehead which is waxing and waning and currently rates as 4 out of 10.  Endorse some mild dizziness.  Complaining of throbbing pain to his right shoulder and left thigh.  He also noticed a huge bruise to his thigh that has improved.  He is currently on Plavix.  He has been taking Aleve for pain.  He denies any increased confusion, light or sound sensitivity, nausea, vomiting, focal numbness or weakness.  No precipitating symptoms prior to the fall.  No chest pain, trouble breathing, abdominal pain.  Past Medical History:  Diagnosis Date  . Anginal pain (HCC)    pressure- last time 11/23/12 saw Dr Harrington Challenger  . CAD, NATIVE VESSEL 07/22/2008    a.  s/p DES to LAD in 2009; s/p repeat DES to LAD 11/2008 (prox to previous stent).  . Constipation   . DIZZINESS, CHRONIC 02/27/2009  . DJD (degenerative joint disease)   . Edema 12/05/2008  . GERD (gastroesophageal reflux disease)   . History of kidney stones   . HYPERLIPIDEMIA-MIXED 07/22/2008  . HYPERTENSION, BENIGN 07/22/2008  . Lung nodule    RLL lung nodule (PET CT 1/10 negative);R parotid hypermetabolic on PET 12/8293  . Shortness of breath    with  exertion  . Stroke [facility]) [phone]   TIA  , Left hand stinging , hurts to flex  . Testicular cancer [facility])     Patient Active Problem List   Diagnosis Date Noted  . Hematuria 10/23/2016  . UTI (urinary tract infection) 10/23/2016  . Nephrolithiasis 10/23/2016  . Cough 05/11/2016  . GERD (gastroesophageal reflux disease) 05/11/2016  . Dizziness, nonspecific 10/11/2014  . 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 DIZZINESS, CHRONIC 02/27/2009  . ABDOMINAL PAIN 02/27/2009  . ABDOMINAL PAIN, GENERALIZED 02/27/2009  . EDEMA 12/05/2008  . CHEST PAIN 11/25/2008  . NECK PAIN 11/08/2008  . Shortness of breath 11/08/2008  . Mixed hyperlipidemia 07/22/2008  . HYPERTENSION, BENIGN 07/22/2008  . CAD, NATIVE VESSEL 07/22/2008    Past Surgical History:  Procedure Laterality Date  . APPENDECTOMY    . Bilateral orchiectomy    . CORONARY ARTERY BYPASS GRAFT N/A 12/01/2012   Procedure: CORONARY ARTERY BYPASS GRAFTING (CABG);  Surgeon: Gaye Pollack, MD;  LIMA to OM and RIMA to RCA  . KIDNEY STONE SURGERY  10/19/2016  . ROTATOR CUFF REPAIR     right  . TONSILLECTOMY    . TOTAL HIP ARTHROPLASTY Left   . Veins stripped         Home Medications    Prior to Admission medications   Medication Sig  Start Date End Date Taking? Authorizing Provider  Ascorbic Acid (VITAMIN C) 1000 MG tablet Take 1,000 mg by mouth at bedtime.   Yes [provider]  aspirin EC 81 MG tablet Take 162 mg by mouth daily.    Yes [provider]  Aspirin-Acetaminophen-Caffeine (GOODY HEADACHE PO) Take 1-2 packets by mouth 2 (two) times daily as needed (pain/headaches).    Yes [provider]  atorvastatin (LIPITOR) 40 MG tablet Take 1 tablet (40 mg total) by mouth daily. Patient taking differently: Take 20 mg by mouth every evening.  01/22/14  Yes Fay Records, MD  b complex vitamins tablet Take 1 tablet by mouth 2 (two) times daily.   Yes [provider]  cholecalciferol (VITAMIN D) 1000 UNITS  tablet Take 1,000 Units by mouth 2 (two) times daily.    Yes [provider]  clopidogrel (PLAVIX) 75 MG tablet Take 75 mg by mouth daily.   Yes [provider]  Coenzyme Q10 (CO Q 10) 100 MG CAPS Take 100 mg by mouth daily.    Yes [provider]  cyanocobalamin 500 MCG tablet Take 500 mcg by mouth 2 (two) times daily. Vitamin B12   Yes [provider]  Flaxseed, Linseed, (FLAX SEED OIL PO) Take 1 capsule by mouth daily.    Yes [provider]  folic acid (FOLVITE) 1 MG tablet Take 1 mg by mouth 2 (two) times daily.   Yes [provider]  hydroxypropyl methylcellulose / hypromellose (ISOPTO TEARS / GONIOVISC) 2.5 % ophthalmic solution Place 1 drop into both eyes 3 (three) times daily as needed for dry eyes.   Yes [provider]  isosorbide mononitrate (IMDUR) 30 MG 24 hr tablet TAKE 1 TABLET(30 MG) BY MOUTH DAILY Patient taking differently: Take 15 mg by mouth in the evening 10/18/16  Yes Fay Records, MD  MAGNESIUM PO Take 1 tablet by mouth daily.    Yes [provider]  Misc Natural Products (OSTEO BI-FLEX JOINT SHIELD PO) Take 1 tablet by mouth 2 (two) times daily.    Yes [provider]  Multiple Vitamins-Minerals (CENTRUM SILVER 50+MEN) TABS Take 1 tablet by mouth 2 (two) times daily.   Yes [provider]  naproxen sodium (ALEVE) 220 MG tablet Take 220-440 mg by mouth 2 (two) times daily as needed (for back pain).   Yes [provider]  Omega-3 Fatty Acids (FISH OIL) 500 MG CAPS Take 500 mg by mouth 2 (two) times daily.   Yes [provider]  pantoprazole (PROTONIX) 40 MG tablet Take 1 tablet (40 mg) by mouth daily. Needs office visit for more refills. Patient taking differently: Take 40 mg by mouth 2 (two) times daily.  03/21/17  Yes Golden Circle, FNP  TURMERIC PO Take 1 capsule by mouth daily.   Yes [provider]  HYDROcodone-acetaminophen (NORCO/VICODIN) 5-325 MG  tablet Take 1 tablet by mouth every 4 (four) hours as needed (cough). Patient not taking: Reported on 06/07/2017 05/13/17   Charlann Lange, PA-C  nitroGLYCERIN (NITROSTAT) 0.4 MG SL tablet Place 1 tablet (0.4 mg total) under the tongue every 5 (five) minutes as needed for chest pain. Patient not taking: Reported on 06/07/2017 08/08/15   Fay Records, MD  polyethylene glycol powder [facility]) powder Take 17 g by mouth 2 (two) times daily. Patient not taking: Reported on 06/07/2017 10/24/16   Janece Canterbury, MD  tamsulosin (FLOMAX) 0.4 MG CAPS capsule Take 1 capsule (0.4 mg total) by mouth  daily. Patient not taking: Reported on 06/07/2017 12/13/16   Golden Circle, FNP  traMADol (ULTRAM) 50 MG tablet Take 1 tablet (50 mg total) by mouth every 6 (six) hours as needed (pain). Patient not taking: Reported on 06/07/2017 12/13/16   Golden Circle, FNP    Family History Family History  Problem Relation Age of Onset  . Aneurysm Brother   . Kidney disease Brother   . Heart disease Mother   . Hypertension Mother   . Diabetes Mother   . COPD Father   . Heart attack Neg Hx   . Stroke Neg Hx     Social History Social History   Tobacco Use  . Smoking status: Never Smoker  . Smokeless tobacco: Never Used  Substance Use Topics  . Alcohol use: Yes    Comment: rare  . Drug use: No     Allergies   Oxybutynin   Review of Systems Review of Systems  All other systems reviewed and are negative.    Physical Exam Updated Vital Signs BP (!) 185/103 (BP Location: Right Arm)   Pulse 69   Temp 98.1 F (36.7 C) (Oral)   Resp 14   SpO2 99%   Physical Exam  Constitutional: He is oriented to person, place, and time. He appears well-developed and well-nourished. No distress.  Elderly male resting comfortably in no acute discomfort.  HENT:  Head: Normocephalic and atraumatic.  No hemotympanum, no septal hematoma, no malocclusion, no midface tenderness, no significant scalp tenderness    Eyes: Conjunctivae and EOM are normal. Pupils are equal, round, and reactive to light.  Neck: Normal range of motion. Neck supple.  No cervical midline spine tenderness  Cardiovascular: Normal rate and regular rhythm.  Pulmonary/Chest: Effort normal and breath sounds normal.  Abdominal: Soft. There is no tenderness.  Musculoskeletal: Normal range of motion. He exhibits tenderness (Right shoulder: Tenderness to deltoid on palpation without any obvious deformity.  Decreased shoulder flexion and abduction secondary to pain.).  Left lower extremity: Tenderness along left thigh on palpation with a moderate sized ecchymosis and hematoma noted.  Left hip with full range of motion.  Left knee is mildly tender with normal flexion extension.  Neurological: He is alert and oriented to person, place, and time. He has normal strength.  Skin: Skin is warm. No rash noted.  Psychiatric: He has a normal mood and affect.  Nursing note and vitals reviewed.    ED Treatments / Results  Labs (all labs ordered are listed, but only abnormal results are displayed) Labs Reviewed - No data to display  EKG  EKG Interpretation None       Radiology Dg Shoulder Right  Result Date: 06/07/2017 CLINICAL DATA:  Fall 9 days ago with shoulder pain EXAM: RIGHT SHOULDER - 2+ VIEW COMPARISON:  None. FINDINGS: Mild AC joint degenerative changes. Mild glenohumeral degenerative changes. No fracture or dislocation. IMPRESSION: No acute osseous abnormality. Electronically Signed   By: Donavan Foil M.D.   On: 06/07/2017 21:49   Ct Head Wo Contrast  Result Date: 06/07/2017 CLINICAL DATA:  Continuous headaches after fall 1 week ago. Currently on Plavix. EXAM: CT HEAD WITHOUT CONTRAST CT CERVICAL SPINE WITHOUT CONTRAST TECHNIQUE: Multidetector CT imaging of the head and cervical spine was performed following the standard protocol without intravenous contrast. Multiplanar CT image reconstructions of the cervical spine were  also generated. COMPARISON:  None. FINDINGS: CT HEAD FINDINGS Brain: No evidence of acute infarction, hemorrhage, hydrocephalus, extra-axial collection or mass lesion/mass effect.  Mild to moderate age related cerebral atrophy. Minimal patchy periventricular white matter and corona radiata hypodensities favor chronic ischemic microvascular white matter disease. Vascular: Atherosclerotic vascular calcification of the carotid siphons. No hyperdense vessel. Skull: Normal. Negative for fracture or focal lesion. Sinuses/Orbits: No acute finding. Other: None. CT CERVICAL SPINE FINDINGS Alignment: Facet mediated trace anterolisthesis at C4-C5. No traumatic malalignment. Skull base and vertebrae: No acute fracture. No primary bone lesion or focal pathologic process. Soft tissues and spinal canal: No prevertebral fluid or swelling. No visible canal hematoma. Disc levels: Moderate disc height loss and uncovertebral hypertrophy at C5-C6 and C6-C7. Moderate bilateral facet arthropathy throughout the cervical spine. Upper chest: Negative. Other: Atherosclerotic vascular calcifications at the bilateral carotid bifurcations. IMPRESSION: 1. No acute intracranial abnormality. Mild-to-moderate age-related cerebral atrophy and minimal chronic microvascular ischemic white matter disease. 2. No acute cervical spine fracture. Moderate degenerative changes. Electronically Signed   By: Titus Dubin M.D.   On: 06/07/2017 17:06   Ct Cervical Spine Wo Contrast  Result Date: 06/07/2017 CLINICAL DATA:  Continuous headaches after fall 1 week ago. Currently on Plavix. EXAM: CT HEAD WITHOUT CONTRAST CT CERVICAL SPINE WITHOUT CONTRAST TECHNIQUE: Multidetector CT imaging of the head and cervical spine was performed following the standard protocol without intravenous contrast. Multiplanar CT image reconstructions of the cervical spine were also generated. COMPARISON:  None. FINDINGS: CT HEAD FINDINGS Brain: No evidence of acute infarction,  hemorrhage, hydrocephalus, extra-axial collection or mass lesion/mass effect. Mild to moderate age related cerebral atrophy. Minimal patchy periventricular white matter and corona radiata hypodensities favor chronic ischemic microvascular white matter disease. Vascular: Atherosclerotic vascular calcification of the carotid siphons. No hyperdense vessel. Skull: Normal. Negative for fracture or focal lesion. Sinuses/Orbits: No acute finding. Other: None. CT CERVICAL SPINE FINDINGS Alignment: Facet mediated trace anterolisthesis at C4-C5. No traumatic malalignment. Skull base and vertebrae: No acute fracture. No primary bone lesion or focal pathologic process. Soft tissues and spinal canal: No prevertebral fluid or swelling. No visible canal hematoma. Disc levels: Moderate disc height loss and uncovertebral hypertrophy at C5-C6 and C6-C7. Moderate bilateral facet arthropathy throughout the cervical spine. Upper chest: Negative. Other: Atherosclerotic vascular calcifications at the bilateral carotid bifurcations. IMPRESSION: 1. No acute intracranial abnormality. Mild-to-moderate age-related cerebral atrophy and minimal chronic microvascular ischemic white matter disease. 2. No acute cervical spine fracture. Moderate degenerative changes. Electronically Signed   By: Titus Dubin M.D.   On: 06/07/2017 17:06   Dg Femur Min 2 Views Left  Result Date: 06/07/2017 CLINICAL DATA:  Fall with pain and bruising EXAM: LEFT FEMUR 2 VIEWS COMPARISON:  CT 10/23/2016 FINDINGS: Status post left hip replacement. No fracture or malalignment. Vascular calcification. Chondrocalcinosis at the knee. IMPRESSION: Status post left hip replacement.  No acute osseous abnormality. Electronically Signed   By: Donavan Foil M.D.   On: 06/07/2017 21:50    Procedures Procedures (including critical care time)  Medications Ordered in ED Medications  ibuprofen (ADVIL,MOTRIN) tablet 800 mg (not administered)     Initial Impression /  Assessment and Plan / ED Course  I have reviewed the triage vital signs and the nursing notes.  Pertinent labs & imaging results that were available during my care of the patient were reviewed by me and considered in my medical decision making (see chart for details).     BP (!) 186/89   Pulse 60   Temp 98.1 F (36.7 C) (Oral)   Resp 16   SpO2 97%  The patient was noted to be hypertensive  today in the emergency department. I have spoken with the patient regarding hypertension and the need for improved management. I instructed the patient to followup with the Primary care doctor within 4 days to improve the management of the patient's hypertension. I also counseled the patient regarding the signs and symptoms which would require an emergent visit to an emergency department for hypertensive urgency and/or hypertensive emergency. The patient understood the need for improved hypertensive management.   Final Clinical Impressions(s) / ED Diagnoses   Final diagnoses:  Fall, initial encounter  Hematoma of left thigh, initial encounter  Right shoulder injury, initial encounter  Bad headache    ED Discharge Orders        Ordered    traMADol (ULTRAM) 50 MG tablet  Every 6 hours PRN     06/07/17 2202     8:53 PM Patient fell 4 days ago.  He is on Plavix.  He did have some headache that has been persistent.  Had CT scan and cervical spine CT without acute finding.  Also complaining of pain to his right shoulder and left thigh, x-ray ordered.  This is a mechanical fall.  Care discussed with Dr. Townsend Roger.   9:58 PM Fortunately xrays of R shoulder and L femur showing no acute fx.  Pain medication given.  Pt stable for discharge.  Return precaution given. In order to decrease risk of narcotic abuse. Pt's record were checked using the [place] Controlled Substance database.     Domenic Moras, PA-C 06/07/17 2203    Pixie Casino, MD 06/07/17 2203

## 2017-08-04 ENCOUNTER — Other Ambulatory Visit: Payer: Self-pay | Admitting: *Deleted

## 2017-08-04 MED ORDER — SUCRALFATE 1 GM/10ML PO SUSP: 1.0000 g | Freq: Three times a day (TID) | ORAL | 3 refills | Status: DC

## 2017-08-04 MED ORDER — NITROGLYCERIN 0.4 MG SL SUBL: 0.4000 mg | SUBLINGUAL_TABLET | SUBLINGUAL | 3 refills | Status: DC | PRN

## 2017-08-04 NOTE — Progress Notes (Signed)
Per Dr. Harrington Challenger placed order for NTG sublinguil as needed for chest pain and carafate 1 g.

## 2017-08-18 ENCOUNTER — Telehealth: Payer: Self-pay | Admitting: Internal Medicine

## 2017-08-18 NOTE — Telephone Encounter (Signed)
Pt called  Told to come into clinic Had cough   Cracked rib at Christmas time after fall  Has had episodes of chest pressure since Christmas  Occur with and without activity Does not bad reflux  Worse at night.   Seen at [facility] recently  On exam:  Lungs CTA  Cardiac RRR  No Se  No signif murmur  Ext without edema   Impr:  CP  It does not sound cardiac in origing   ? GI  ? Some muscular.  Will try to contact physician at [place] on same meds

## 2017-08-30 ENCOUNTER — Ambulatory Visit (INDEPENDENT_AMBULATORY_CARE_PROVIDER_SITE_OTHER): Payer: PPO | Admitting: Nurse Practitioner

## 2017-08-30 ENCOUNTER — Encounter: Payer: Self-pay | Admitting: Nurse Practitioner

## 2017-08-30 VITALS — BP 118/80 | HR 74 | Temp 98.7°F | Resp 16 | Ht 72.0 in | Wt 217.8 lb

## 2017-08-30 DIAGNOSIS — I2510 Atherosclerotic heart disease of native coronary artery without angina pectoris: Secondary | ICD-10-CM

## 2017-08-30 DIAGNOSIS — E782 Mixed hyperlipidemia: Secondary | ICD-10-CM | POA: Diagnosis not present

## 2017-08-30 DIAGNOSIS — K219 Gastro-esophageal reflux disease without esophagitis: Secondary | ICD-10-CM | POA: Diagnosis not present

## 2017-08-30 DIAGNOSIS — R05 Cough: Secondary | ICD-10-CM | POA: Diagnosis not present

## 2017-08-30 DIAGNOSIS — R300 Dysuria: Secondary | ICD-10-CM | POA: Diagnosis not present

## 2017-08-30 DIAGNOSIS — I10 Essential (primary) hypertension: Secondary | ICD-10-CM

## 2017-08-30 DIAGNOSIS — W19XXXS Unspecified fall, sequela: Secondary | ICD-10-CM

## 2017-08-30 DIAGNOSIS — H538 Other visual disturbances: Secondary | ICD-10-CM

## 2017-08-30 DIAGNOSIS — R059 Cough, unspecified: Secondary | ICD-10-CM

## 2017-08-30 NOTE — Assessment & Plan Note (Signed)
Stable, continue PPI and F[facility]U with [facility]

## 2017-08-30 NOTE — Patient Instructions (Signed)
It was good to meet you today.  I will see you back as needed.   Thanks for letting me take care of you today :)

## 2017-08-30 NOTE — Assessment & Plan Note (Signed)
Maintained on statin, asa by cardiology he will continue follow up with cardiology

## 2017-08-30 NOTE — Assessment & Plan Note (Signed)
Continue follow-up with cardiology 

## 2017-08-30 NOTE — Assessment & Plan Note (Signed)
Stable, continue [facility]

## 2017-08-30 NOTE — Progress Notes (Signed)
Name: [name]   MRN: [id number]    DOB: 1941-04-30   Date:08/30/2017       Progress Note  Subjective  Chief Complaint  Chief Complaint  Patient presents with  . Establish Care    HPI [name] is coming in today to establish care, transferring from another provider in our clinic that left. [facility] including an [facility] but would like to have a primary care provider in the community for any acute needs.  He also follows with cardiology Dr Harrington Challenger routinely for coronary artery disease and hyperlipidemia and orthopedics for shoulder pain and back pain. He does not want any testing or have any complaints today. He says all routine labs are done by [place].  Patient Active Problem List   Diagnosis Date Noted  . Hematuria 10/23/2016  . UTI (urinary tract infection) 10/23/2016  . Nephrolithiasis 10/23/2016  . Cough 05/11/2016  . GERD (gastroesophageal reflux disease) 05/11/2016  . Dizziness, nonspecific 10/11/2014  . 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 DIZZINESS, CHRONIC 02/27/2009  . ABDOMINAL PAIN 02/27/2009  . ABDOMINAL PAIN, GENERALIZED 02/27/2009  . EDEMA 12/05/2008  . CHEST PAIN 11/25/2008  . NECK PAIN 11/08/2008  . Shortness of breath 11/08/2008  . Mixed hyperlipidemia 07/22/2008  . HYPERTENSION, BENIGN 07/22/2008  . CAD, NATIVE VESSEL 07/22/2008    Past Surgical History:  Procedure Laterality Date  . APPENDECTOMY    . Bilateral orchiectomy    . CORONARY ARTERY BYPASS GRAFT N/A 12/01/2012   Procedure: CORONARY ARTERY BYPASS GRAFTING (CABG);  Surgeon: Gaye Pollack, MD;  LIMA to OM and RIMA to RCA  . KIDNEY STONE SURGERY  10/19/2016  . ROTATOR CUFF REPAIR     right  . TONSILLECTOMY    . TOTAL HIP ARTHROPLASTY Left   . Veins stripped      Family History  Problem Relation Age of Onset  . Aneurysm Brother   . Kidney disease Brother   . Heart disease Mother   . Hypertension Mother   . Diabetes Mother   . COPD Father    . Heart attack Neg Hx   . Stroke Neg Hx     Social History   Socioeconomic History  . Marital status: Widowed    Spouse name: Not on file  . Number of children: 2  . Years of education: 33  . Highest education level: Not on file  Social Needs  . Financial resource strain: Not on file  . Food insecurity - worry: Not on file  . Food insecurity - inability: Not on file  . Transportation needs - medical: Not on file  . Transportation needs - non-medical: Not on file  Occupational History  . Occupation: Forensic scientist   . Occupation: Security guard  Tobacco Use  . Smoking status: Never Smoker  . Smokeless tobacco: Never Used  Substance and Sexual Activity  . Alcohol use: Yes    Comment: rare  . Drug use: No  . Sexual activity: Not Currently  Other Topics Concern  . Not on file  Social History Narrative   Fun: Watch baseball, disaster mission through [employer] and travel.   Denies abuse and feels safe at home.      Current Outpatient Medications:  .  Ascorbic Acid (VITAMIN C) 1000 MG tablet, Take 1,000 mg by mouth at bedtime., Disp: , Rfl:  .  aspirin EC 81 MG tablet, Take 162 mg by mouth daily. , Disp: ,  Rfl:  .  Aspirin-Acetaminophen-Caffeine (GOODY HEADACHE PO), Take 1-2 packets by mouth 2 (two) times daily as needed (pain/headaches). , Disp: , Rfl:  .  atorvastatin (LIPITOR) 40 MG tablet, Take 1 tablet (40 mg total) by mouth daily. (Patient taking differently: Take 20 mg by mouth every evening. ), Disp: 30 tablet, Rfl: 4 .  b complex vitamins tablet, Take 1 tablet by mouth 2 (two) times daily., Disp: , Rfl:  .  cholecalciferol (VITAMIN D) 1000 UNITS tablet, Take 1,000 Units by mouth 2 (two) times daily. , Disp: , Rfl:  .  clopidogrel (PLAVIX) 75 MG tablet, Take 75 mg by mouth daily., Disp: , Rfl:  .  Coenzyme Q10 (CO Q 10) 100 MG CAPS, Take 100 mg by mouth daily. , Disp: , Rfl:  .  cyanocobalamin 500 MCG tablet, Take 500 mcg by mouth 2 (two) times daily. Vitamin B12,  Disp: , Rfl:  .  dicyclomine (BENTYL) 20 MG tablet, Take 20 mg by mouth 3 (three) times daily before meals., Disp: , Rfl:  .  Flaxseed, Linseed, (FLAX SEED OIL PO), Take 1 capsule by mouth daily. , Disp: , Rfl:  .  folic acid (FOLVITE) 1 MG tablet, Take 1 mg by mouth 2 (two) times daily., Disp: , Rfl:  .  hydroxypropyl methylcellulose / hypromellose (ISOPTO TEARS / GONIOVISC) 2.5 % ophthalmic solution, Place 1 drop into both eyes 3 (three) times daily as needed for dry eyes., Disp: , Rfl:  .  isosorbide mononitrate (IMDUR) 30 MG 24 hr tablet, TAKE 1 TABLET(30 MG) BY MOUTH DAILY (Patient taking differently: Take 15 mg by mouth in the evening), Disp: 90 tablet, Rfl: 1 .  Lactobacillus Acidophilus POWD, by Does not apply route., Disp: , Rfl:  .  MAGNESIUM PO, Take 1 tablet by mouth daily. , Disp: , Rfl:  .  Misc Natural Products (OSTEO BI-FLEX JOINT SHIELD PO), Take 1 tablet by mouth 2 (two) times daily. , Disp: , Rfl:  .  Multiple Vitamins-Minerals (CENTRUM SILVER 50+MEN) TABS, Take 1 tablet by mouth 2 (two) times daily., Disp: , Rfl:  .  naproxen sodium (ALEVE) 220 MG tablet, Take 220-440 mg by mouth 2 (two) times daily as needed (for back pain)., Disp: , Rfl:  .  nitroGLYCERIN (NITROSTAT) 0.4 MG SL tablet, Place 1 tablet (0.4 mg total) under the tongue every 5 (five) minutes as needed for chest pain. Up to 3 doses per episode of chest pain.  If 3rd dose is needed, please call 911, Disp: 25 tablet, Rfl: 3 .  Omega-3 Fatty Acids (FISH OIL) 500 MG CAPS, Take 500 mg by mouth 2 (two) times daily., Disp: , Rfl:  .  pantoprazole (PROTONIX) 40 MG tablet, Take 1 tablet (40 mg) by mouth daily. Needs office visit for more refills. (Patient taking differently: Take 40 mg by mouth 2 (two) times daily. ), Disp: 30 tablet, Rfl: 0 .  sucralfate (CARAFATE) 1 GM/10ML suspension, Take 10 mLs (1 g total) by mouth 4 (four) times daily -  with meals and at bedtime., Disp: 420 mL, Rfl: 3 .  traMADol (ULTRAM) 50 MG tablet,  Take 1 tablet (50 mg total) by mouth every 6 (six) hours as needed for moderate pain or severe pain., Disp: 12 tablet, Rfl: 0 .  TURMERIC PO, Take 1 capsule by mouth daily., Disp: , Rfl:   Allergies  Allergen Reactions  . Oxybutynin Itching and Other (See Comments)    Patient's skin became VERY ITCHY ALL OVER after taking  his     Review of Systems  Constitutional: Negative for chills and fever.  HENT: Positive for congestion.   Eyes: Positive for blurred vision.  Respiratory: Positive for cough. Negative for shortness of breath.   Cardiovascular: Negative for chest pain and palpitations.  Gastrointestinal: Positive for heartburn. Negative for constipation and diarrhea.  Genitourinary: Positive for dysuria. Negative for hematuria.  Musculoskeletal: Positive for falls.  Skin: Negative for rash.  Neurological: Positive for headaches.  Endo/Heme/Allergies: Positive for environmental allergies. Does not bruise/bleed easily.  Psychiatric/Behavioral: Negative for depression. The patient is not nervous/anxious.    Cough- this is not a new problem. He reports cough, postnasal drip, congestion for several months. Heartburn- [facility], which relieves his heartburn. Blurred vision- this is not a new problem. He reports some blurred vision recently and has ophthalmology appointment scheduled by [facility] for this afternoon.  Dysuria-this is a chronic problem. [facility] and treatment. Falls- he fell in December. He was seen by [facility] and is now following with orthopedic provider for back pain after the fall.he denies any falls since.  Objective  Vitals:   08/30/17 0912  BP: 118/80  Pulse: 74  Resp: 16  Temp: 98.7 F (37.1 C)  TempSrc: Oral  SpO2: 95%  Weight: 217 lb 12.8 oz (98.8 kg)  Height: 6' (1.829 m)   Body mass index is 29.54 kg/m.  Physical Exam Vital signs reviewed. Constitutional: Patient appears well-developed and  well-nourished. No distress.  HENT: Head: Normocephalic and atraumatic.Nose: Nose normal. Mouth/Throat: Oropharynx is clear and moist. Eyes: Conjunctivae and EOM are normal. Pupils are equal, round, and reactive to light. No scleral icterus.  Neck: Normal range of motion. Neck supple. Cardiovascular: Normal rate, regular rhythm and normal heart sounds.  No murmur heard. No BLE edema. Distal pulses intact. Pulmonary/Chest: Effort normal and breath sounds normal. No respiratory distress. Abdominal: Soft. No distension. Musculoskeletal: Normal range of motion. No gross deformities Neurological: he is alert and oriented to person, place, and time. Coordination, balance, strength, speech and gait are normal.  Skin: Skin is warm and dry. No rash noted. No erythema.  Psychiatric: Patient has a normal mood and affect. behavior is normal. Judgment and thought content normal.   Assessment & Plan RTC as needed  -Reviewed Health Maintenance: up to date  Continue follow up with cardiology, orthopedics and [facility] for [facility], I will see patient back for acute healthcare needs  Cough Recommend trial of OTC antihistamine/allergy medication- flonase and/or zyrtec. He will follow up for no improvement or worsening condition.  Blurred vision Will f/u with ophthalmology this afternoon   Dysuria Continue F/U with [facility]   Fall, sequela Continue follow up with orthopedics

## 2017-09-18 ENCOUNTER — Encounter (HOSPITAL_COMMUNITY): Payer: Self-pay | Admitting: Family Medicine

## 2017-09-18 ENCOUNTER — Ambulatory Visit (HOSPITAL_COMMUNITY)
Admission: EM | Admit: 2017-09-18 | Discharge: 2017-09-18 | Disposition: A | Discharge status: Home / Self Care | Payer: PPO | Attending: Physician Assistant | Admitting: Physician Assistant

## 2017-09-18 DIAGNOSIS — M791 Myalgia, unspecified site: Secondary | ICD-10-CM

## 2017-09-18 DIAGNOSIS — K529 Noninfective gastroenteritis and colitis, unspecified: Secondary | ICD-10-CM

## 2017-09-18 LAB — POCT URINALYSIS DIP (DEVICE)
BILIRUBIN URINE: NEGATIVE
Glucose, UA: NEGATIVE mg/dL
HGB URINE DIPSTICK: NEGATIVE
Ketones, ur: NEGATIVE mg/dL
LEUKOCYTES UA: NEGATIVE
Nitrite: NEGATIVE
Protein, ur: NEGATIVE mg/dL
SPECIFIC GRAVITY, URINE: 1.025 (ref 1.005–1.030)
Urobilinogen, UA: 0.2 mg/dL (ref 0.0–1.0)
pH: 7 (ref 5.0–8.0)

## 2017-09-18 LAB — POCT I-STAT, CHEM 8
BUN: 19 mg/dL (ref 6–20)
CREATININE: 0.8 mg/dL (ref 0.61–1.24)
Calcium, Ion: 1.21 mmol/L (ref 1.15–1.40)
Chloride: 104 mmol/L (ref 101–111)
Glucose, Bld: 133 mg/dL — ABNORMAL HIGH (ref 65–99)
HEMATOCRIT: 41 % (ref 39.0–52.0)
Hemoglobin: 13.9 g/dL (ref 13.0–17.0)
Potassium: 4.5 mmol/L (ref 3.5–5.1)
SODIUM: 142 mmol/L (ref 135–145)
TCO2: 27 mmol/L (ref 22–32)

## 2017-09-18 MED ORDER — ACETAMINOPHEN 325 MG PO TABS
ORAL_TABLET | ORAL | Status: AC
  Filled 2017-09-18: qty 3

## 2017-09-18 MED ORDER — TRAMADOL HCL 50 MG PO TABS: 50.0000 mg | ORAL_TABLET | Freq: Three times a day (TID) | ORAL | 0 refills | Status: DC

## 2017-09-18 MED ORDER — ACETAMINOPHEN 500 MG PO TABS
1000.0000 mg | ORAL_TABLET | Freq: Once | ORAL | Status: AC
  Administered 2017-09-18: 1000 mg via ORAL

## 2017-09-18 NOTE — ED Notes (Signed)
Not in treatment room 

## 2017-09-18 NOTE — ED Triage Notes (Signed)
Pt here for cough, neck pain,severe headache shoulder pain, and lower abd pain since Friday. Denies N,V. Reports diarrhea.

## 2017-09-18 NOTE — Discharge Instructions (Signed)
Take tylenol 1000 mg every 8 hours for pain.  It is okay to take tramadol up to 24 hours before you procedure. Make sure you pursue bowel prep per instructions.

## 2017-09-18 NOTE — ED Provider Notes (Addendum)
09/18/2017 6:15 PM   DOB: Jan 21, 1941 / MRN: [id number]  SUBJECTIVE:  [name] is a 77 y.o. male presenting for multiple symptoms including chronic diarrhea as well as neck pain.  Patient is scheduled to have a colonoscopy on Wednesday and was advised to stop all of his medications which he has as of Friday.  His medications included naproxen, tramadol, Plavix, statin.  His symptoms started after stopping his chronic medications.  He denies weakness.  The colonoscopy is being done due to a history of diarrhea along with "leakage."  He is allergic to oxybutynin.   He  has a past medical history of Anginal pain ([place]), CAD, NATIVE VESSEL (07/22/2008), Constipation, DIZZINESS, CHRONIC (02/27/2009), DJD (degenerative joint disease), Edema (12/05/2008), GERD (gastroesophageal reflux disease), History of kidney stones, HYPERLIPIDEMIA-MIXED (07/22/2008), HYPERTENSION, BENIGN (07/22/2008), Lung nodule, Shortness of breath, Stroke ([place]) ([id number]), and Testicular cancer ([place]).    He  reports that he has never smoked. He has never used smokeless tobacco. He reports that he drinks alcohol. He reports that he does not use drugs. He  reports that he does not currently engage in sexual activity. The patient  has a past surgical history that includes Appendectomy; Tonsillectomy; Rotator cuff repair; Total hip arthroplasty (Left); Bilateral orchiectomy; Veins stripped; Coronary artery bypass graft (N/A, 12/01/2012); and Kidney stone surgery (10/19/2016).  His family history includes Aneurysm in his brother; COPD in his father; Diabetes in his mother; Heart disease in his mother; Hypertension in his mother; Kidney disease in his brother.  Review of Systems  Constitutional: Negative for chills, diaphoresis and fever.  Eyes: Negative.   Respiratory: Negative for cough, hemoptysis, sputum production, shortness of breath and wheezing.   Cardiovascular: Negative for chest pain, orthopnea and leg swelling.  Gastrointestinal:  Positive for abdominal pain and diarrhea. Negative for blood in stool, constipation, heartburn, melena, nausea and vomiting.  Genitourinary: Negative for flank pain.  Skin: Negative for rash.  Neurological: Negative for dizziness, tingling, tremors, sensory change, speech change, focal weakness, seizures, loss of consciousness, weakness and headaches.    OBJECTIVE:  BP (!) 149/72   Pulse 85   Temp 99.2 F (37.3 C) (Oral)   Resp 18   SpO2 99%   Physical Exam  Constitutional: He is oriented to person, place, and time. He appears well-developed. He is active and cooperative.  Non-toxic appearance.  HENT:  Right Ear: Hearing, tympanic membrane, external ear and ear canal normal.  Left Ear: Hearing, tympanic membrane, external ear and ear canal normal.  Nose: Nose normal. Right sinus exhibits no maxillary sinus tenderness and no frontal sinus tenderness. Left sinus exhibits no maxillary sinus tenderness and no frontal sinus tenderness.  Mouth/Throat: Uvula is midline, oropharynx is clear and moist and mucous membranes are normal. No oropharyngeal exudate, posterior oropharyngeal edema or tonsillar abscesses.  Eyes: Pupils are equal, round, and reactive to light. Conjunctivae and EOM are normal.  Cardiovascular: Normal rate, regular rhythm, S1 normal, S2 normal, normal heart sounds, intact distal pulses and normal pulses. Exam reveals no gallop and no friction rub.  No murmur heard. Pulmonary/Chest: Effort normal. No stridor. No tachypnea. No respiratory distress. He has no wheezes. He has no rales.  Abdominal: He exhibits no distension.  Musculoskeletal: He exhibits no edema.  Lymphadenopathy:       Head (right side): No submandibular and no tonsillar adenopathy present.       Head (left side): No submandibular and no tonsillar adenopathy present.    He has no cervical adenopathy.  Neurological: He is alert and oriented to person, place, and time. He has normal strength and normal  reflexes. He is not disoriented. No cranial nerve deficit or sensory deficit. He exhibits normal muscle tone. Coordination and gait normal.  Skin: Skin is warm and dry. He is not diaphoretic. No pallor.  Psychiatric: His behavior is normal.  Vitals reviewed.   Results for orders placed or performed during the hospital encounter of 09/18/17 (from the past 72 hour(s))  POCT urinalysis dip (device)     Status: None   Collection Time: 09/18/17  6:07 PM  Result Value Ref Range   Glucose, UA NEGATIVE NEGATIVE mg/dL   Bilirubin Urine NEGATIVE NEGATIVE   Ketones, ur NEGATIVE NEGATIVE mg/dL   Specific Gravity, Urine 1.025 1.005 - 1.030   Hgb urine dipstick NEGATIVE NEGATIVE   pH 7.0 5.0 - 8.0   Protein, ur NEGATIVE NEGATIVE mg/dL   Urobilinogen, UA 0.2 0.0 - 1.0 mg/dL   Nitrite NEGATIVE NEGATIVE   Leukocytes, UA NEGATIVE NEGATIVE    Comment: Biochemical Testing Only. Please order routine urinalysis from main lab if confirmatory testing is needed.  I-STAT, chem 8     Status: Abnormal   Collection Time: 09/18/17  6:08 PM  Result Value Ref Range   Sodium 142 135 - 145 mmol/L   Potassium 4.5 3.5 - 5.1 mmol/L   Chloride 104 101 - 111 mmol/L   BUN 19 6 - 20 mg/dL   Creatinine, Ser 0.80 0.61 - 1.24 mg/dL   Glucose, Bld 133 (H) 65 - 99 mg/dL   Calcium, Ion 1.21 1.15 - 1.40 mmol/L   TCO2 27 22 - 32 mmol/L   Hemoglobin 13.9 13.0 - 17.0 g/dL   HCT 41.0 39.0 - 52.0 %    No results found.  ASSESSMENT AND PLAN:  Orders Placed This Encounter  Procedures  . I-STAT, chem 8    Standing Status:   Standing    Number of Occurrences:   1  . POCT urinalysis dip (device)    Standing Status:   Standing    Number of Occurrences:   1     Myalgia: Patient here with multiple symptoms that started after stopping his chronic medication which included naproxen, tramadol, dicyclomine, Plavix, statin.  I am screening basic lab work today to ensure nothing important is being missed.  He is got good vital  signs and a relatively normal exam.  He does look somewhat uncomfortable.  Chronic diarrhea      The patient is advised to call or return to clinic if he does not see an improvement in symptoms, or to seek the care of the closest emergency department if he worsens with the above plan.   Philis Fendt, MHS, PA-C 09/18/2017 6:15 PM   Tereasa Coop, PA-C 09/18/17 1817  Tereasa Coop, PA-C 09/18/17 1817  Tereasa Coop, PA-C 09/18/17 1817

## 2017-10-03 ENCOUNTER — Telehealth: Payer: Self-pay | Admitting: Internal Medicine

## 2017-10-03 NOTE — Telephone Encounter (Signed)
Spoke to patient  HE says his breathing is OK Being evaluated from GI standpoint (colonoscopy) at [place] has problems with R shoulder after fall at [facility]  Note Seen by Dr Romero Liner at [facility]   [phone]  Pt's last 4 of social security number: [phone].

## 2017-10-19 ENCOUNTER — Ambulatory Visit: Payer: No Typology Code available for payment source | Attending: Orthopedic Surgery

## 2017-10-19 ENCOUNTER — Other Ambulatory Visit: Payer: Self-pay

## 2017-10-19 DIAGNOSIS — R252 Cramp and spasm: Secondary | ICD-10-CM | POA: Insufficient documentation

## 2017-10-19 DIAGNOSIS — G8929 Other chronic pain: Secondary | ICD-10-CM | POA: Diagnosis present

## 2017-10-19 DIAGNOSIS — M25511 Pain in right shoulder: Secondary | ICD-10-CM | POA: Diagnosis present

## 2017-10-19 DIAGNOSIS — M25611 Stiffness of right shoulder, not elsewhere classified: Secondary | ICD-10-CM | POA: Insufficient documentation

## 2017-10-19 NOTE — Patient Instructions (Signed)
(  Clinic) Flexion: Horizontal Abduction (Multiple Resist) Resistance: Single Arm Punch   De espaldas al punto de agarre. Pulgar hacia adentro empuje el brazo derecho hacia adelante. Extienda completamente el codo y lleve el hombro hacia adelante tanto como le sea posible. Rote el tronco. Repita ___ veces. Repita con el otro brazo por rutina. Descanse ___ segundos despus de cada rutina. Realice ___ rutinas por sesin.  http://plyo.exer.us/224   Copyright  VHI. All rights reserved.  EXTENSION: Standing - Resistance Band: Stable (Active)   Stand, right arm at side. Against yellow resistance band, draw arm backward, as far as possible, keeping elbow straight. Complete ___ sets of ___ repetitions. Perform ___ sessions per day.  Copyright  VHI. All rights reserved.  EXTERNAL ROTATION: Sitting - Resistance Band (Active)   Sit, right arm elbow bent to 90, elbow against side, hand forward. Against yellow resistance band, rotate forearm outward, keeping elbow at side. Rotate forearm outward as far as possible. Complete ___ sets of ___ repetitions. Perform ___ sessions per day.  Copyright  VHI. All rights reserved.  Internal Rotation (Resistive Band)   Doble el codo en ngulo recto y mantngalo firmemente contra su cintura. Fije la banda al picaporte de la puerta y tire Medical sales representative. Mantenga ____ segundos. Repita ____ veces. Haga ____ sesiones por da.  Copyright  VHI. All rights reserved.  (Home) External Rotation: With Abduction - Sitting   Opposite side toward anchor, right arm supported, elbow out, bent to 90, forearm across body. Rotate shoulder by pulling hand away from body. Keep elbow bent to 90. Repeat ____ times per set. Do ____ sets per session. Do ____ sessions per week. Use ____ lb weights.  Copyright  VHI. All rights reserved.     Also, neck side stretch to Lt  30 sec x 2-3 2-3x/day                       ICE 3x/day for 15 min

## 2017-10-19 NOTE — Therapy (Signed)
[place], [place], [address] Phone: [phone]   Fax:  [phone]  Physical Therapy Evaluation  Patient Details  Name: [name] MRN: [id number] Date of Birth: 18-Aug-1940 Referring Provider: Bennetta Laos, MD   Encounter Date: 10/19/2017  PT End of Session - 10/19/17 0757    Visit Number  1    Number of Visits  12    Date for PT Re-Evaluation  12/02/17    Authorization Type  [facility]    Authorization Time Period  15 visits max    PT Start Time  0755    PT Stop Time  0838    PT Time Calculation (min)  43 min    Activity Tolerance  Patient tolerated treatment well    Behavior During Therapy  [facility] for tasks assessed/performed       Past Medical History:  Diagnosis Date  . Anginal pain (HCC)    pressure- last time 11/23/12 saw Dr [name]  . CAD, NATIVE VESSEL 07/22/2008    a.  s/p DES to LAD in 2009; s/p repeat DES to LAD 11/2008 (prox to previous stent).  . Constipation   . DIZZINESS, CHRONIC 02/27/2009  . DJD (degenerative joint disease)   . Edema 12/05/2008  . GERD (gastroesophageal reflux disease)   . History of kidney stones   . HYPERLIPIDEMIA-MIXED 07/22/2008  . HYPERTENSION, BENIGN 07/22/2008  . Lung nodule    RLL lung nodule (PET CT 1/10 negative);R parotid hypermetabolic on PET 06/3242  . Shortness of breath    with exertion  . Stroke [facility]) [phone]   TIA  , Left hand stinging , hurts to flex  . Testicular cancer [facility])     Past Surgical History:  Procedure Laterality Date  . APPENDECTOMY    . Bilateral orchiectomy    . CORONARY ARTERY BYPASS GRAFT N/A 12/01/2012   Procedure: CORONARY ARTERY BYPASS GRAFTING (CABG);  Surgeon: Randall Pollack, MD;  LIMA to OM and RIMA to RCA  . KIDNEY STONE SURGERY  10/19/2016  . ROTATOR CUFF REPAIR     right  . TONSILLECTOMY    . TOTAL HIP ARTHROPLASTY Left   . Veins stripped      There were no vitals filed for this visit.   Subjective Assessment - 10/19/17  0805    Subjective  He reports torn RTC RT shoulder.    no apparent injury .   Second tear and this repair is Ok.         Limitations  -- Does all activity with RT arm but pain is high.  Limits heavier lifting 100 pds or more.     How long can you sit comfortably?  NA    How long can you stand comfortably?  NA    How long can you walk comfortably?  NA    Patient Stated Goals  Eliminate pain    Currently in Pain?  Yes    Pain Score  5     Pain Location  Shoulder    Pain Orientation  Right;Anterior    Pain Descriptors / Indicators  Aching    Pain Type  Chronic pain    Pain Onset  More than a month ago    Pain Frequency  Constant    Aggravating Factors   using arm    Pain Relieving Factors  Meds , rest ,          [facility] PT Assessment - 10/19/17 0001  Assessment   Medical Diagnosis  shoulder pain     Referring Provider  Randall Laos, MD    Onset Date/Surgical Date  -- 6 months ago    Hand Dominance  Right    Next MD Visit  11/2017    Prior Therapy  for previous repair      Precautions   Precautions  None      Restrictions   Weight Bearing Restrictions  No      Balance Screen   Has the patient fallen in the past 6 months  No      Prior Function   Level of Independence  Independent    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      Cognition   Overall Cognitive Status  Within Functional Limits for tasks assessed      Observation/Other Assessments   Focus on Therapeutic Outcomes (FOTO)   40% limited      ROM / Strength   AROM / PROM / Strength  AROM;Strength;PROM      AROM   AROM Assessment Site  Shoulder    Right/Left Shoulder  Right;Left    Right Shoulder Extension  -- reaching behind back 6 inch less than LT     Right Shoulder Flexion  143 Degrees    Right Shoulder ABduction  160 Degrees    Right Shoulder Internal Rotation  23 Degrees    Right Shoulder External Rotation  80 Degrees    Right Shoulder Horizontal ABduction  0 Degrees     Right Shoulder Horizontal  ADduction  100 Degrees    Left Shoulder Flexion  125 Degrees    Left Shoulder ABduction  143 Degrees    Left Shoulder Internal Rotation  45 Degrees    Left Shoulder External Rotation  80 Degrees    Left Shoulder Horizontal ABduction  10 Degrees    Left Shoulder Horizontal ADduction  117 Degrees      Strength   Overall Strength Comments  5/5/ bilateral shoulders with RT abduction  5-/5 due to pain      Ambulation/Gait   Gait Comments  WNL                Objective measurements completed on examination: See above findings.              PT Education - 10/19/17 0835    Education provided  Yes    Education Details  POC , ice , isometrics    Person(s) Educated  Patient    Methods  Explanation;Demonstration;Tactile cues;Verbal cues    Comprehension  Returned demonstration;Verbalized understanding       PT Short Term Goals - 10/19/17 0925      PT SHORT TERM GOAL #1   Title  He will be indpendent wth initial HEp    Time  3    Period  Weeks    Status  New      PT SHORT TERM GOAL #2   Title  Pain will decr 20-30 % generally with times of no pain at rest    Time  3    Period  Weeks    Status  New      PT SHORT TERM GOAL #3   Title  RT shoulder  reaching behind back improve to 2 inches or less comp to LT shoulder    Time  3    Period  Weeks    Status  New  PT Long Term Goals - 10/19/17 7371      PT LONG TERM GOAL #1   Title  He will be independent with all HEP issued    Time  6    Period  Weeks    Status  New      PT LONG TERM GOAL #2   Title  He will report pain decr 50% or more with use of RT arm for normal light to moderate activity with RT arm    Time  6    Period  Weeks    Status  New      PT LONG TERM GOAL #3   Title  He wil report pain as intermittant  and no pain with none lifting activity.     Time  6    Period  Weeks    Status  New             Plan - 10/19/17 0758    Clinical Impression  Statement  Randall  [name] present with chronic  RT shoulder pain with soft tissuue tenderness and decr ROM limiting heavy lifting  and making painful normal use of RT shoulder.     Clinical Presentation  Stable    Clinical Decision Making  Low    Rehab Potential  Good    PT Frequency  2x / week    PT Duration  6 weeks    PT Treatment/Interventions  Manual techniques;Dry needling;Taping;Iontophoresis 4mg /ml Dexamethasone;Moist Heat;Cryotherapy;Ultrasound;Therapeutic activities;Therapeutic exercise;Patient/family education    PT Next Visit Plan  [place]view isometrics  and neck stretch , progress isometrics if able.  modalities and STW , tape    PT Home Exercise Plan  isometrics , neck stretch , icing    Consulted and Agree with Plan of Care  Patient       Patient will benefit from skilled therapeutic intervention in order to improve the following deficits and impairments:  Pain, Postural dysfunction, Increased muscle spasms, Decreased range of motion, Impaired UE functional use  Visit Diagnosis: Chronic right shoulder pain     Problem List Patient Active Problem List   Diagnosis Date Noted  . Hematuria 10/23/2016  . Nephrolithiasis 10/23/2016  . Cough 05/11/2016  . GERD (gastroesophageal reflux disease) 05/11/2016  . 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 DIZZINESS, CHRONIC 02/27/2009  . Mixed hyperlipidemia 07/22/2008  . HYPERTENSION, BENIGN 07/22/2008  . CAD, NATIVE VESSEL 07/22/2008    [name]  PT 10/19/2017, 9:32 AM  [facility] [address] [place], [place], [address] Phone: [phone]   Fax:  [phone]  Name: [name] MRN: [id number] Date of Birth: 28-Sep-1940

## 2017-11-01 ENCOUNTER — Encounter

## 2017-11-03 ENCOUNTER — Ambulatory Visit: Payer: No Typology Code available for payment source | Admitting: Physical Therapy

## 2017-11-03 ENCOUNTER — Ambulatory Visit: Payer: No Typology Code available for payment source

## 2017-11-03 DIAGNOSIS — M25611 Stiffness of right shoulder, not elsewhere classified: Secondary | ICD-10-CM

## 2017-11-03 DIAGNOSIS — M25511 Pain in right shoulder: Principal | ICD-10-CM

## 2017-11-03 DIAGNOSIS — R252 Cramp and spasm: Secondary | ICD-10-CM

## 2017-11-03 DIAGNOSIS — G8929 Other chronic pain: Secondary | ICD-10-CM

## 2017-11-03 NOTE — Therapy (Signed)
[place], [place], [address] Phone: [phone]   Fax:  [phone]  Physical Therapy Treatment  Patient Details  Name: [name] MRN: [id number] Date of Birth: 22-Jan-1941 Referring Provider: Bennetta Laos, MD   Encounter Date: 11/03/2017  PT End of Session - 11/03/17 0852    Visit Number  2    Number of Visits  12    Date for PT Re-Evaluation  12/02/17    Authorization Type  [facility]    Authorization Time Period  15 visits max    Authorization - Visit Number  2    Authorization - Number of Visits  15    PT Start Time  0850 came at wrong time    PT Stop Time  0930    PT Time Calculation (min)  40 min    Activity Tolerance  Patient tolerated treatment well;No increased pain    Behavior During Therapy  WFL for tasks assessed/performed       Past Medical History:  Diagnosis Date  . Anginal pain (HCC)    pressure- last time 11/23/12 saw Dr Harrington Challenger  . CAD, NATIVE VESSEL 07/22/2008    a.  s/p DES to LAD in 2009; s/p repeat DES to LAD 11/2008 (prox to previous stent).  . Constipation   . DIZZINESS, CHRONIC 02/27/2009  . DJD (degenerative joint disease)   . Edema 12/05/2008  . GERD (gastroesophageal reflux disease)   . History of kidney stones   . HYPERLIPIDEMIA-MIXED 07/22/2008  . HYPERTENSION, BENIGN 07/22/2008  . Lung nodule    RLL lung nodule (PET CT 1/10 negative);R parotid hypermetabolic on PET 10/537  . Shortness of breath    with exertion  . Stroke [facility]) [phone]   TIA  , Left hand stinging , hurts to flex  . Testicular cancer [facility])     Past Surgical History:  Procedure Laterality Date  . APPENDECTOMY    . Bilateral orchiectomy    . CORONARY ARTERY BYPASS GRAFT N/A 12/01/2012   Procedure: CORONARY ARTERY BYPASS GRAFTING (CABG);  Surgeon: Gaye Pollack, MD;  LIMA to OM and RIMA to RCA  . KIDNEY STONE SURGERY  10/19/2016  . ROTATOR CUFF REPAIR     right  . TONSILLECTOMY    . TOTAL HIP ARTHROPLASTY  Left   . Veins stripped      There were no vitals filed for this visit.  Subjective Assessment - 11/03/17 0900    Subjective  Feeling better.   Can do more with RT arm . still able to carry props with less pain. mow lawn and doing HEp .  With pain takes aleve and pain eases    Pain Score  4     Pain Location  Shoulder    Pain Descriptors / Indicators  Aching    Pain Type  Chronic pain    Pain Onset  More than a month ago    Pain Frequency  Constant    Aggravating Factors   using arm    Pain Relieving Factors  meds , rest         [facility] PT Assessment - 11/03/17 0001      AROM   Right Shoulder Flexion  146 Degrees                   [facility] Adult PT Treatment/Exercise - 11/03/17 0001      Shoulder Exercises: Seated   Other Seated Exercises  isometrics 5 reps 5  sec asked to ease with pain      Modalities   Modalities  Ultrasound      Ultrasound   Ultrasound Location  RT shoulder 100% 1.5 Wcm2 1 MHz    Ultrasound Goals  Pain      Manual Therapy   Manual Therapy  Joint mobilization;Soft tissue mobilization;Taping    Joint Mobilization  Gr 2 distraction and posterior glides    Soft tissue mobilization  most to anterior shoulder but lateral/post and superior and stretch to pec minor    Kinesiotex  Inhibit Muscle      Kinesiotix   Inhibit Muscle   RT shoulder Y over deltoid and singl strap over trap and to post scapula               PT Short Term Goals - 10/19/17 0925      PT SHORT TERM GOAL #1   Title  He will be indpendent wth initial HEp    Time  3    Period  Weeks    Status  New      PT SHORT TERM GOAL #2   Title  Pain will decr 20-30 % generally with times of no pain at rest    Time  3    Period  Weeks    Status  New      PT SHORT TERM GOAL #3   Title  RT shoulder  reaching behind back improve to 2 inches or less comp to LT shoulder    Time  3    Period  Weeks    Status  New        PT Long Term Goals - 10/19/17 5053      PT LONG  TERM GOAL #1   Title  He will be independent with all HEP issued    Time  6    Period  Weeks    Status  New      PT LONG TERM GOAL #2   Title  He will report pain decr 50% or more with use of RT arm for normal light to moderate activity with RT arm    Time  6    Period  Weeks    Status  New      PT LONG TERM GOAL #3   Title  He wil report pain as intermittant  and no pain with none lifting activity.     Time  6    Period  Weeks    Status  New            Plan - 11/03/17 9767    Clinical Impression Statement  No incr pain. Continue with isometrics at thome and modalities and manual  next    PT Treatment/Interventions  Manual techniques;Dry needling;Taping;Iontophoresis 4mg /ml Dexamethasone;Moist Heat;Cryotherapy;Ultrasound;Therapeutic activities;Therapeutic exercise;Patient/family education    PT Next Visit Plan   , progress isometrics if able.  modalities and STW , tape    PT Home Exercise Plan  isometrics , neck stretch , icing    Consulted and Agree with Plan of Care  Patient       Patient will benefit from skilled therapeutic intervention in order to improve the following deficits and impairments:  Pain, Postural dysfunction, Increased muscle spasms, Decreased range of motion, Impaired UE functional use  Visit Diagnosis: Chronic right shoulder pain  Stiffness of right shoulder, not elsewhere classified  Cramp and spasm     Problem List Patient Active Problem List   Diagnosis Date Noted  .  Hematuria 10/23/2016  . Nephrolithiasis 10/23/2016  . Cough 05/11/2016  . GERD (gastroesophageal reflux disease) 05/11/2016  . 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 DIZZINESS, CHRONIC 02/27/2009  . Mixed hyperlipidemia 07/22/2008  . HYPERTENSION, BENIGN 07/22/2008  . CAD, NATIVE VESSEL 07/22/2008    [name]  PT 11/03/2017, 9:29 AM  [facility] [address] [place], [place], [address] Phone: [phone]   Fax:   [phone]  Name: [name] MRN: [id number] Date of Birth: [birth year]

## 2017-11-08 ENCOUNTER — Ambulatory Visit: Payer: No Typology Code available for payment source

## 2017-11-08 DIAGNOSIS — M25511 Pain in right shoulder: Secondary | ICD-10-CM | POA: Diagnosis not present

## 2017-11-08 DIAGNOSIS — R252 Cramp and spasm: Secondary | ICD-10-CM

## 2017-11-08 DIAGNOSIS — G8929 Other chronic pain: Secondary | ICD-10-CM

## 2017-11-08 DIAGNOSIS — M25611 Stiffness of right shoulder, not elsewhere classified: Secondary | ICD-10-CM

## 2017-11-08 NOTE — Patient Instructions (Signed)
Band red flex/ER/IR / ROw 10-20 reps 1x/day Rt , pectoral stretch 2-3x/day 2 reps 30 sec

## 2017-11-08 NOTE — Therapy (Signed)
[place], [place], [address] Phone: [phone]   Fax:  [phone]  Physical Therapy Treatment  Patient Details  Name: [name] MRN: [id number] Date of Birth: 1940-11-06 Referring Provider: Bennetta Laos, MD   Encounter Date: 11/08/2017  PT End of Session - 11/08/17 0803    Visit Number  3    Number of Visits  12    Date for PT Re-Evaluation  12/02/17    PT Start Time  0800    PT Stop Time  0845    PT Time Calculation (min)  45 min    Activity Tolerance  Patient tolerated treatment well;No increased pain    Behavior During Therapy  WFL for tasks assessed/performed       Past Medical History:  Diagnosis Date  . Anginal pain (HCC)    pressure- last time 11/23/12 saw Dr Harrington Challenger  . CAD, NATIVE VESSEL 07/22/2008    a.  s/p DES to LAD in 2009; s/p repeat DES to LAD 11/2008 (prox to previous stent).  . Constipation   . DIZZINESS, CHRONIC 02/27/2009  . DJD (degenerative joint disease)   . Edema 12/05/2008  . GERD (gastroesophageal reflux disease)   . History of kidney stones   . HYPERLIPIDEMIA-MIXED 07/22/2008  . HYPERTENSION, BENIGN 07/22/2008  . Lung nodule    RLL lung nodule (PET CT 1/10 negative);R parotid hypermetabolic on PET 12/8586  . Shortness of breath    with exertion  . Stroke [facility]) [phone]   TIA  , Left hand stinging , hurts to flex  . Testicular cancer [facility])     Past Surgical History:  Procedure Laterality Date  . APPENDECTOMY    . Bilateral orchiectomy    . CORONARY ARTERY BYPASS GRAFT N/A 12/01/2012   Procedure: CORONARY ARTERY BYPASS GRAFTING (CABG);  Surgeon: Gaye Pollack, MD;  LIMA to OM and RIMA to RCA  . KIDNEY STONE SURGERY  10/19/2016  . ROTATOR CUFF REPAIR     right  . TONSILLECTOMY    . TOTAL HIP ARTHROPLASTY Left   . Veins stripped      There were no vitals filed for this visit.  Subjective Assessment - 11/08/17 0803    Subjective  No pain to start    Currently in  Pain?  No/denies                       [facility] Adult PT Treatment/Exercise - 11/08/17 0001      Shoulder Exercises: Standing   External Rotation  Right;12 reps    Internal Rotation  Right;12 reps    Row  Right;12 reps      Shoulder Exercises: ROM/Strengthening   UBE (Upper Arm Bike)  L1 5 min forward      Shoulder Exercises: Stretch   Other Shoulder Stretches  stand ER humerous at side and at 90 degrees elevation at door  15-30 sec       Ultrasound   Ultrasound Location  RT shoulder most anterior    Ultrasound Parameters  100% 1.5 Wcm2, ! MHz    Ultrasound Goals  Pain      Manual Therapy   Soft tissue mobilization  STmobs maually . only tenderness aterior in pectorals.              PT Education - 11/08/17 0848    Education provided  Yes    Education Details  HEP    Person(s) Educated  Patient  Methods  Explanation;Demonstration;Handout;Verbal cues;Tactile cues    Comprehension  Returned demonstration;Verbalized understanding       PT Short Term Goals - 10/19/17 0925      PT SHORT TERM GOAL #1   Title  He will be indpendent wth initial HEp    Time  3    Period  Weeks    Status  New      PT SHORT TERM GOAL #2   Title  Pain will decr 20-30 % generally with times of no pain at rest    Time  3    Period  Weeks    Status  New      PT SHORT TERM GOAL #3   Title  RT shoulder  reaching behind back improve to 2 inches or less comp to LT shoulder    Time  3    Period  Weeks    Status  New        PT Long Term Goals - 11/08/17 0803      PT LONG TERM GOAL #1   Title  He will be independent with all HEP issued    Baseline  able to do isometrics    Status  Achieved            Plan - 11/08/17 0804    Clinical Impression Statement  Continues with pain with lifting and incr load other wise improving with decr pain. Mostly pectoral pain now. Continue to progress with strength and stretch     PT Treatment/Interventions  Manual techniques;Dry  needling;Taping;Iontophoresis 4mg /ml Dexamethasone;Moist Heat;Cryotherapy;Ultrasound;Therapeutic activities;Therapeutic exercise;Patient/family education    PT Next Visit Plan   , progress bands  if able.  modalities and STW , tape    PT Home Exercise Plan  isometrics , neck stretch , icing, band IR/ER row/ flexion    Consulted and Agree with Plan of Care  Patient       Patient will benefit from skilled therapeutic intervention in order to improve the following deficits and impairments:  Pain, Postural dysfunction, Increased muscle spasms, Decreased range of motion, Impaired UE functional use  Visit Diagnosis: Chronic right shoulder pain  Stiffness of right shoulder, not elsewhere classified  Cramp and spasm     Problem List Patient Active Problem List   Diagnosis Date Noted  . Hematuria 10/23/2016  . Nephrolithiasis 10/23/2016  . Cough 05/11/2016  . GERD (gastroesophageal reflux disease) 05/11/2016  . 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 DIZZINESS, CHRONIC 02/27/2009  . Mixed hyperlipidemia 07/22/2008  . HYPERTENSION, BENIGN 07/22/2008  . CAD, NATIVE VESSEL 07/22/2008    [name]  PT 11/08/2017, 8:48 AM  [facility] [address] [place], [place], [address] Phone: [phone]   Fax:  [phone]  Name: [name] MRN: [id number] Date of Birth: September 24, 1940

## 2017-11-10 ENCOUNTER — Ambulatory Visit: Payer: No Typology Code available for payment source

## 2017-11-10 DIAGNOSIS — G8929 Other chronic pain: Secondary | ICD-10-CM

## 2017-11-10 DIAGNOSIS — M25511 Pain in right shoulder: Principal | ICD-10-CM

## 2017-11-10 DIAGNOSIS — M25611 Stiffness of right shoulder, not elsewhere classified: Secondary | ICD-10-CM

## 2017-11-10 DIAGNOSIS — R252 Cramp and spasm: Secondary | ICD-10-CM

## 2017-11-10 NOTE — Therapy (Addendum)
[place], [place], [address] Phone: [phone]   Fax:  [phone]  Physical Therapy Treatment/ Discharge  Patient Details  Name: [name] MRN: [id number] Date of Birth: 1940/11/21 Referring Provider: Bennetta Laos, MD   Encounter Date: 11/10/2017  PT End of Session - 11/10/17 0755    Visit Number  4    Number of Visits  12    Date for PT Re-Evaluation  12/02/17    Authorization Type  [facility]    Authorization Time Period  15 visits max    Authorization - Visit Number  3    Authorization - Number of Visits  15    PT Start Time  7001    PT Stop Time  0836    PT Time Calculation (min)  41 min    Activity Tolerance  Patient tolerated treatment well;No increased pain    Behavior During Therapy  WFL for tasks assessed/performed       Past Medical History:  Diagnosis Date  . Anginal pain (HCC)    pressure- last time 11/23/12 saw Dr Harrington Challenger  . CAD, NATIVE VESSEL 07/22/2008    a.  s/p DES to LAD in 2009; s/p repeat DES to LAD 11/2008 (prox to previous stent).  . Constipation   . DIZZINESS, CHRONIC 02/27/2009  . DJD (degenerative joint disease)   . Edema 12/05/2008  . GERD (gastroesophageal reflux disease)   . History of kidney stones   . HYPERLIPIDEMIA-MIXED 07/22/2008  . HYPERTENSION, BENIGN 07/22/2008  . Lung nodule    RLL lung nodule (PET CT 1/10 negative);R parotid hypermetabolic on PET 12/4942  . Shortness of breath    with exertion  . Stroke [facility]) [phone]   TIA  , Left hand stinging , hurts to flex  . Testicular cancer [facility])     Past Surgical History:  Procedure Laterality Date  . APPENDECTOMY    . Bilateral orchiectomy    . CORONARY ARTERY BYPASS GRAFT N/A 12/01/2012   Procedure: CORONARY ARTERY BYPASS GRAFTING (CABG);  Surgeon: Gaye Pollack, MD;  LIMA to OM and RIMA to RCA  . KIDNEY STONE SURGERY  10/19/2016  . ROTATOR CUFF REPAIR     right  . TONSILLECTOMY    . TOTAL HIP ARTHROPLASTY Left    . Veins stripped      There were no vitals filed for this visit.  Subjective Assessment - 11/10/17 0757    Subjective  5/10 pain to start. No problems with band at home and no incr in pain. May have been from pulling self up into [employer]    Currently in Pain?  Yes    Pain Score  5     Pain Location  Shoulder    Pain Orientation  Right;Anterior    Pain Descriptors / Indicators  Aching    Pain Onset  More than a month ago    Pain Frequency  Intermittent    Aggravating Factors   may have slept on shoulder     Pain Relieving Factors  rest, meds                       [facility] Adult PT Treatment/Exercise - 11/10/17 0001      Shoulder Exercises: ROM/Strengthening   UBE (Upper Arm Bike)  L 1.5 50mn forward 3 min back      Modalities   Modalities  Iontophoresis      Ultrasound   Ultrasound  Location  RT shoulder    Ultrasound Parameters  100% ! MHz !.5 Wcm2    Ultrasound Goals  Pain      Iontophoresis   Type of Iontophoresis  Dexamethasone    Location  anterior RT shoulder pectoral and bicep tendon area    Dose  1cc    Time  4-6 hours      Manual Therapy   Soft tissue mobilization  STmobs manually . only tenderness anterior in pectorals.                PT Short Term Goals - 10/19/17 7564      PT SHORT TERM GOAL #1   Title  He will be indpendent wth initial HEp    Time  3    Period  Weeks    Status  New      PT SHORT TERM GOAL #2   Title  Pain will decr 20-30 % generally with times of no pain at rest    Time  3    Period  Weeks    Status  New      PT SHORT TERM GOAL #3   Title  RT shoulder  reaching behind back improve to 2 inches or less comp to LT shoulder    Time  3    Period  Weeks    Status  New        PT Long Term Goals - 11/08/17 0803      PT LONG TERM GOAL #1   Title  He will be independent with all HEP issued    Baseline  able to do isometrics    Status  Achieved            Plan - 11/10/17 0756    Clinical  Impression Statement  No changes Still pain anterior shoulder. tolerated green band but did not issue.      PT Treatment/Interventions  Manual techniques;Dry needling;Taping;Iontophoresis 61m/ml Dexamethasone;Moist Heat;Cryotherapy;Ultrasound;Therapeutic activities;Therapeutic exercise;Patient/family education    PT Next Visit Plan   , progress bands  if able.  modalities and STW , ionto if not irritated fro patch    PT Home Exercise Plan  isometrics , neck stretch , icing, band IR/ER row/ flexion    Consulted and Agree with Plan of Care  Patient       Patient will benefit from skilled therapeutic intervention in order to improve the following deficits and impairments:  Pain, Postural dysfunction, Increased muscle spasms, Decreased range of motion, Impaired UE functional use  Visit Diagnosis: Chronic right shoulder pain  Stiffness of right shoulder, not elsewhere classified  Cramp and spasm     Problem List Patient Active Problem List   Diagnosis Date Noted  . Hematuria 10/23/2016  . Nephrolithiasis 10/23/2016  . Cough 05/11/2016  . GERD (gastroesophageal reflux disease) 05/11/2016  . 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 DIZZINESS, CHRONIC 02/27/2009  . Mixed hyperlipidemia 07/22/2008  . HYPERTENSION, BENIGN 07/22/2008  . CAD, NATIVE VESSEL 07/22/2008    CDarrel Hoover PT 11/10/2017, 8:41 AM  [facility] [address]Phone: [phone]  Fax:  [phone] Name: Randall RICKETSONMRN: [id number] of Birth: 8Mar 14, 1942 PHYSICAL THERAPY DISCHARGE SUMMARY  Visits from Start of Care:   Current functional level related to goals / functional outcomes: Unknown . See above. He was called to be out of town for service  With [employer] And would be  out of town for [employer]. He did not return Remaining deficits: Unknown See above. As of last visit   Education / Equipment: HEP  Plan: Patient agrees to  discharge.  Patient goals were not met. Patient is being discharged due to not returning since the last visit.  ?????Pearson Forster PT  02/02/18

## 2017-11-10 NOTE — Patient Instructions (Signed)

## 2017-11-16 ENCOUNTER — Ambulatory Visit: Payer: No Typology Code available for payment source | Admitting: Physical Therapy

## 2017-12-12 ENCOUNTER — Encounter: Payer: Self-pay | Admitting: *Deleted

## 2017-12-13 ENCOUNTER — Ambulatory Visit: Payer: PPO | Admitting: Internal Medicine

## 2017-12-13 ENCOUNTER — Encounter: Payer: Self-pay | Admitting: Internal Medicine

## 2017-12-13 VITALS — BP 150/80 | HR 64 | Ht 72.0 in | Wt 213.1 lb

## 2017-12-13 DIAGNOSIS — I2510 Atherosclerotic heart disease of native coronary artery without angina pectoris: Secondary | ICD-10-CM | POA: Diagnosis not present

## 2017-12-13 DIAGNOSIS — E782 Mixed hyperlipidemia: Secondary | ICD-10-CM | POA: Diagnosis not present

## 2017-12-13 MED ORDER — SUCRALFATE 1 G PO TABS: 1.0000 g | ORAL_TABLET | Freq: Three times a day (TID) | ORAL | 11 refills | Status: DC

## 2017-12-13 MED ORDER — NITROGLYCERIN 0.4 MG SL SUBL: 0.4000 mg | SUBLINGUAL_TABLET | SUBLINGUAL | 3 refills | Status: DC | PRN

## 2017-12-13 NOTE — Progress Notes (Signed)
Cardiology Office Note   Date:  12/13/2017   ID:  [name], DOB 10/19/1940, MRN [id number]  PCP:  System, Provider Not In  Cardiologist:   Dorris Carnes, MD   F/U of CAD    History of Present Illness: CAD 9 [name] is a 77 y.o. male with a history of CAD s/p PTCA x 2 then  CABG in 2014 (LIMA to OM; RIMA to RCA)  Pt also with a history of kidney stones   I saw the pt briefly in clinic earlier this year  The pt called because he was thinking about going on a mission trip   Needed to be cleared He comes in today saying he doesn't think he feels well enough to go   Has problems with R knee  Seen at [place] in [place].     Patient also being followed for partially torn rotator cuff (R)   He says he has been troubled now by stomach pains (high and low abdomen)  He is due to get some tests done at [place] addition he notes some intermitt chest tightness  Not always associated with activity   Current Meds  Medication Sig  . Ascorbic Acid (VITAMIN C) 1000 MG tablet Take 1,000 mg by mouth at bedtime.  Marland Kitchen aspirin EC 81 MG tablet Take 162 mg by mouth daily.   . Aspirin-Acetaminophen-Caffeine (GOODY HEADACHE PO) Take 1-2 packets by mouth 2 (two) times daily as needed (pain/headaches).   Marland Kitchen atorvastatin (LIPITOR) 40 MG tablet Take 1 tablet (40 mg total) by mouth daily. (Patient taking differently: Take 20 mg by mouth every evening. )  . b complex vitamins tablet Take 1 tablet by mouth 2 (two) times daily.  . cholecalciferol (VITAMIN D) 1000 UNITS tablet Take 1,000 Units by mouth 2 (two) times daily.   . clopidogrel (PLAVIX) 75 MG tablet Take 75 mg by mouth daily.  . Coenzyme Q10 (CO Q 10) 100 MG CAPS Take 100 mg by mouth daily.   . cyanocobalamin 500 MCG tablet Take 500 mcg by mouth 2 (two) times daily. Vitamin B12  . dicyclomine (BENTYL) 20 MG tablet Take 20 mg by mouth 3 (three) times daily before meals.  . Flaxseed, Linseed, (FLAX SEED OIL PO) Take 1 capsule by mouth daily.    . folic acid (FOLVITE) 1 MG tablet Take 1 mg by mouth 2 (two) times daily.  . hydroxypropyl methylcellulose / hypromellose (ISOPTO TEARS / GONIOVISC) 2.5 % ophthalmic solution Place 1 drop into both eyes 3 (three) times daily as needed for dry eyes.  . Lactobacillus Acidophilus POWD by Does not apply route.  Marland Kitchen MAGNESIUM PO Take 1 tablet by mouth daily.   . Misc Natural Products (OSTEO BI-FLEX JOINT SHIELD PO) Take 1 tablet by mouth 2 (two) times daily.   . Multiple Vitamins-Minerals (CENTRUM SILVER 50+MEN) TABS Take 1 tablet by mouth 2 (two) times daily.  . naproxen sodium (ALEVE) 220 MG tablet Take 220-440 mg by mouth 2 (two) times daily as needed (for back pain).  . nitroGLYCERIN (NITROSTAT) 0.4 MG SL tablet Place 1 tablet (0.4 mg total) under the tongue every 5 (five) minutes as needed for chest pain. Up to 3 doses per episode of chest pain.  If 3rd dose is needed, please call 911  . Omega-3 Fatty Acids (FISH OIL) 500 MG CAPS Take 500 mg by mouth 2 (two) times daily.  . pantoprazole (PROTONIX) 40 MG tablet Take 1 tablet (40 mg)  by mouth daily. Needs office visit for more refills. (Patient taking differently: Take 40 mg by mouth 2 (two) times daily. )  . sucralfate (CARAFATE) 1 GM/10ML suspension Take 10 mLs (1 g total) by mouth 4 (four) times daily -  with meals and at bedtime.  . TURMERIC PO Take 1 capsule by mouth daily.     Allergies:   Oxybutynin   Past Medical History:  Diagnosis Date  . Anginal pain (HCC)    pressure- last time 11/23/12 saw Dr Harrington Challenger  . CAD, NATIVE VESSEL 07/22/2008    a.  s/p DES to LAD in 2009; s/p repeat DES to LAD 11/2008 (prox to previous stent).  . Constipation   . DIZZINESS, CHRONIC 02/27/2009  . DJD (degenerative joint disease)   . Edema 12/05/2008  . GERD (gastroesophageal reflux disease)   . History of kidney stones   . HYPERLIPIDEMIA-MIXED 07/22/2008  . HYPERTENSION, BENIGN 07/22/2008  . Lung nodule    RLL lung nodule (PET CT 1/10 negative);R parotid  hypermetabolic on PET 0/3474  . Shortness of breath    with exertion  . Stroke [facility]) [phone]   TIA  , Left hand stinging , hurts to flex  . Testicular cancer [facility])     Past Surgical History:  Procedure Laterality Date  . APPENDECTOMY    . Bilateral orchiectomy    . CORONARY ARTERY BYPASS GRAFT N/A 12/01/2012   Procedure: CORONARY ARTERY BYPASS GRAFTING (CABG);  Surgeon: Gaye Pollack, MD;  LIMA to OM and RIMA to RCA  . KIDNEY STONE SURGERY  10/19/2016  . ROTATOR CUFF REPAIR     right  . TONSILLECTOMY    . TOTAL HIP ARTHROPLASTY Left   . Veins stripped       Social History:  The patient  reports that he has never smoked. He has never used smokeless tobacco. He reports that he drinks alcohol. He reports that he does not use drugs.   Family History:  The patient's family history includes Aneurysm in his brother; COPD in his father; Diabetes in his mother; Heart disease in his mother; Hypertension in his mother; Kidney disease in his brother.    ROS:  Please see the history of present illness. All other systems are reviewed and  Negative to the above problem except as noted.    PHYSICAL EXAM: VS:  Pulse 64   Ht 6' (1.829 m)   Wt 96.7 kg (213 lb 1.9 oz)   SpO2 98%   BMI 28.90 kg/m   GEN: Well nourished, well developed, in no acute distress  HEENT: normal  Neck: no JVD, carotid bruits, or masses Cardiac: RRR; no murmurs, rubs, or gallops,no edema  Respiratory:  clear to auscultation bilaterally, normal work of breathing GI: soft, nontender, nondistended, + BS  No hepatomegaly  MS: no deformity Moving all extremities   Skin: warm and dry, no rash Neuro:  Strength and sensation are intact Psych: euthymic mood, full affect   EKG:  EKG is not ordered today.   Lipid Panel    Component Value Date/Time   CHOL 127 04/16/2014 1546   TRIG 186.0 (H) 04/16/2014 1546   HDL 33.30 (L) 04/16/2014 1546   CHOLHDL 4 04/16/2014 1546   VLDL 37.2 04/16/2014 1546   LDLCALC 57  04/16/2014 1546      Wt Readings from Last 3 Encounters:  12/13/17 96.7 kg (213 lb 1.9 oz)  08/30/17 98.8 kg (217 lb 12.8 oz)  05/13/17 97.1 kg (214 lb)  ASSESSMENT AND PLAN:  1  CAD  IPt has some intermittent tightness   Concerning   Not assocated with activity though   Worrisome though becausae he does not complain    He has been taking pain meds and some NSAIDs   This may be contrib   Told him to avoaid NSAIDS  2  Abdominal pain   Not sure what this represents    He has some testing planned at [facility]   Will try to get in touch with medicine clinic 3  HL  Cotninue meds    Current medicines are reviewed at length with the patient today.  The patient does not have concerns regarding medicines.  Signed, Dorris Carnes, MD  12/13/2017 1:05 PM    [facility] [place], [place], [place]  [address] Phone: [phone]; Fax: [phone]

## 2017-12-13 NOTE — Patient Instructions (Addendum)
Your physician recommends that you continue on your current medications as directed. Please refer to the Current Medication list given to you today. Dr. Harrington Challenger will speak with you after she hears back from the [place].

## 2018-02-08 DIAGNOSIS — Z0000 Encounter for general adult medical examination without abnormal findings: Secondary | ICD-10-CM | POA: Diagnosis not present

## 2018-06-02 ENCOUNTER — Ambulatory Visit: Payer: PPO | Admitting: Internal Medicine

## 2018-06-05 ENCOUNTER — Encounter: Payer: Self-pay | Admitting: Internal Medicine

## 2018-06-06 ENCOUNTER — Encounter: Payer: Self-pay | Admitting: Internal Medicine

## 2018-06-16 ENCOUNTER — Telehealth: Payer: Self-pay | Admitting: *Deleted

## 2018-06-16 NOTE — Telephone Encounter (Signed)
Spoke to patient and he confirmed his surgery for Rt Rotator cuff is on 07/19/18.  Dr Harrington Challenger wrote him a letter that the [facility] received clearing him for surgery but holding his Plavix was not addressed.  I have spoken with Dr Harrington Challenger who has given permission for him to hold his Plavix 5 days prior to surgery and resume at surgeons consent.

## 2018-09-22 ENCOUNTER — Telehealth: Payer: Self-pay | Admitting: Internal Medicine

## 2018-09-22 NOTE — Telephone Encounter (Signed)
New Message:   Patient calling to speak with someone. Patient says his has the symptoms of the virus. Please call patient back.

## 2018-09-22 NOTE — Telephone Encounter (Signed)
Patient reports symptoms started 3 days ago Very dry throat Diarrhea -reports olive colored Temp is 98 Fatigue, weakness, no energy Wet, raspy cough Runny nose No chest tightness SOB noted when bending over or going up steps. Has not travelled. 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 Harrington Challenger. Suggested possible virtual visit with an urgent care.

## 2018-09-23 NOTE — Telephone Encounter (Signed)
Patient feeling some better   Still with cough Plans to isolate Call if changes

## 2018-09-25 ENCOUNTER — Emergency Department (HOSPITAL_COMMUNITY): Payer: PPO

## 2018-09-25 ENCOUNTER — Emergency Department (HOSPITAL_COMMUNITY)
Admission: EM | Admit: 2018-09-25 | Discharge: 2018-09-25 | Disposition: A | Discharge status: Home / Self Care | Payer: PPO | Attending: Emergency Medicine | Admitting: Emergency Medicine

## 2018-09-25 ENCOUNTER — Encounter (HOSPITAL_COMMUNITY): Payer: Self-pay

## 2018-09-25 ENCOUNTER — Other Ambulatory Visit: Payer: Self-pay

## 2018-09-25 DIAGNOSIS — Z8673 Personal history of transient ischemic attack (TIA), and cerebral infarction without residual deficits: Secondary | ICD-10-CM | POA: Diagnosis not present

## 2018-09-25 DIAGNOSIS — R05 Cough: Secondary | ICD-10-CM | POA: Diagnosis not present

## 2018-09-25 DIAGNOSIS — Z7982 Long term (current) use of aspirin: Secondary | ICD-10-CM | POA: Insufficient documentation

## 2018-09-25 DIAGNOSIS — I2510 Atherosclerotic heart disease of native coronary artery without angina pectoris: Secondary | ICD-10-CM | POA: Diagnosis not present

## 2018-09-25 DIAGNOSIS — E1142 Type 2 diabetes mellitus with diabetic polyneuropathy: Secondary | ICD-10-CM | POA: Diagnosis not present

## 2018-09-25 DIAGNOSIS — Z5321 Procedure and treatment not carried out due to patient leaving prior to being seen by health care provider: Secondary | ICD-10-CM | POA: Diagnosis not present

## 2018-09-25 DIAGNOSIS — H43813 Vitreous degeneration, bilateral: Secondary | ICD-10-CM | POA: Diagnosis not present

## 2018-09-25 DIAGNOSIS — R079 Chest pain, unspecified: Secondary | ICD-10-CM | POA: Diagnosis not present

## 2018-09-25 DIAGNOSIS — Z96642 Presence of left artificial hip joint: Secondary | ICD-10-CM | POA: Insufficient documentation

## 2018-09-25 DIAGNOSIS — H4423 Degenerative myopia, bilateral: Secondary | ICD-10-CM | POA: Diagnosis not present

## 2018-09-25 DIAGNOSIS — L97511 Non-pressure chronic ulcer of other part of right foot limited to breakdown of skin: Secondary | ICD-10-CM | POA: Diagnosis not present

## 2018-09-25 DIAGNOSIS — K760 Fatty (change of) liver, not elsewhere classified: Secondary | ICD-10-CM | POA: Diagnosis not present

## 2018-09-25 DIAGNOSIS — R059 Cough, unspecified: Secondary | ICD-10-CM

## 2018-09-25 DIAGNOSIS — Z951 Presence of aortocoronary bypass graft: Secondary | ICD-10-CM | POA: Diagnosis not present

## 2018-09-25 DIAGNOSIS — R0602 Shortness of breath: Secondary | ICD-10-CM | POA: Diagnosis not present

## 2018-09-25 DIAGNOSIS — H2513 Age-related nuclear cataract, bilateral: Secondary | ICD-10-CM | POA: Diagnosis not present

## 2018-09-25 DIAGNOSIS — M5416 Radiculopathy, lumbar region: Secondary | ICD-10-CM | POA: Diagnosis not present

## 2018-09-25 DIAGNOSIS — H04123 Dry eye syndrome of bilateral lacrimal glands: Secondary | ICD-10-CM | POA: Diagnosis not present

## 2018-09-25 DIAGNOSIS — B0221 Postherpetic geniculate ganglionitis: Secondary | ICD-10-CM | POA: Diagnosis not present

## 2018-09-25 DIAGNOSIS — Z794 Long term (current) use of insulin: Secondary | ICD-10-CM | POA: Diagnosis not present

## 2018-09-25 DIAGNOSIS — R109 Unspecified abdominal pain: Secondary | ICD-10-CM | POA: Diagnosis not present

## 2018-09-25 DIAGNOSIS — R7309 Other abnormal glucose: Secondary | ICD-10-CM | POA: Diagnosis not present

## 2018-09-25 DIAGNOSIS — Z79899 Other long term (current) drug therapy: Secondary | ICD-10-CM | POA: Insufficient documentation

## 2018-09-25 DIAGNOSIS — I10 Essential (primary) hypertension: Secondary | ICD-10-CM | POA: Insufficient documentation

## 2018-09-25 DIAGNOSIS — I5022 Chronic systolic (congestive) heart failure: Secondary | ICD-10-CM | POA: Diagnosis not present

## 2018-09-25 DIAGNOSIS — E11621 Type 2 diabetes mellitus with foot ulcer: Secondary | ICD-10-CM | POA: Diagnosis not present

## 2018-09-25 DIAGNOSIS — B0229 Other postherpetic nervous system involvement: Secondary | ICD-10-CM | POA: Diagnosis not present

## 2018-09-25 DIAGNOSIS — E1165 Type 2 diabetes mellitus with hyperglycemia: Secondary | ICD-10-CM | POA: Diagnosis not present

## 2018-09-25 DIAGNOSIS — M65842 Other synovitis and tenosynovitis, left hand: Secondary | ICD-10-CM | POA: Diagnosis not present

## 2018-09-25 DIAGNOSIS — R112 Nausea with vomiting, unspecified: Secondary | ICD-10-CM | POA: Diagnosis not present

## 2018-09-25 LAB — CBC
HCT: 39.1 % (ref 39.0–52.0)
Hemoglobin: 12.1 g/dL — ABNORMAL LOW (ref 13.0–17.0)
MCH: 28.8 pg (ref 26.0–34.0)
MCHC: 30.9 g/dL (ref 30.0–36.0)
MCV: 93.1 fL (ref 80.0–100.0)
Platelets: 220 10*3/uL (ref 150–400)
RBC: 4.2 MIL/uL — ABNORMAL LOW (ref 4.22–5.81)
RDW: 16.9 % — ABNORMAL HIGH (ref 11.5–15.5)
WBC: 5.1 10*3/uL (ref 4.0–10.5)
nRBC: 0 % (ref 0.0–0.2)

## 2018-09-25 LAB — COMPREHENSIVE METABOLIC PANEL
ALT: 17 U/L (ref 0–44)
AST: 20 U/L (ref 15–41)
Albumin: 4.1 g/dL (ref 3.5–5.0)
Alkaline Phosphatase: 68 U/L (ref 38–126)
Anion gap: 7 (ref 5–15)
BUN: 28 mg/dL — ABNORMAL HIGH (ref 8–23)
CO2: 25 mmol/L (ref 22–32)
Calcium: 9.5 mg/dL (ref 8.9–10.3)
Chloride: 107 mmol/L (ref 98–111)
Creatinine, Ser: 0.89 mg/dL (ref 0.61–1.24)
GFR calc Af Amer: >60 mL/min (ref >60)
GFR calc non Af Amer: >60 mL/min (ref >60)
Glucose, Bld: 119 mg/dL — ABNORMAL HIGH (ref 70–99)
Potassium: 4.2 mmol/L (ref 3.5–5.1)
Sodium: 139 mmol/L (ref 135–145)
Total Bilirubin: 0.2 mg/dL — ABNORMAL LOW (ref 0.3–1.2)
Total Protein: 7 g/dL (ref 6.5–8.1)

## 2018-09-25 LAB — LACTIC ACID, PLASMA: Lactic Acid, Venous: 1.3 mmol/L (ref 0.5–1.9)

## 2018-09-25 LAB — I-STAT TROPONIN, ED: Troponin i, poc: 0 ng/mL (ref 0.00–0.08)

## 2018-09-25 LAB — BRAIN NATRIURETIC PEPTIDE: B Natriuretic Peptide: 60.8 pg/mL (ref 0.0–100.0)

## 2018-09-25 MED ORDER — SODIUM CHLORIDE (PF) 0.9 % IJ SOLN
INTRAMUSCULAR | Status: AC
  Filled 2018-09-25: qty 50

## 2018-09-25 MED ORDER — SODIUM CHLORIDE 0.9 % IV SOLN
INTRAVENOUS | Status: DC
  Administered 2018-09-25: 19:00:00 via INTRAVENOUS

## 2018-09-25 MED ORDER — IOHEXOL 350 MG/ML SOLN
100.0000 mL | Freq: Once | INTRAVENOUS | Status: AC | PRN
  Administered 2018-09-25: 100 mL via INTRAVENOUS

## 2018-09-25 NOTE — ED Provider Notes (Signed)
[facility] DEPT Provider Note   CSN: [id number] Arrival date & time: 09/25/18  1622    History   Chief Complaint Chief Complaint  Patient presents with  . Covid testing  . Abdominal Pain    HPI [name] is a 78 y.o. male.     78 year old male presents with 5 days of nonproductive cough with increasing shortness of breath.  Does have a history of CHF but states that this feels different.  Denies any lower extremity edema or CHF or anginal type symptoms.  No vomiting or diarrhea.  Subjective low-grade temp with associated myalgias.  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  Called his doctor and was told to come in for further management.     Past Medical History:  Diagnosis Date  . Anginal pain (HCC)    pressure- last time 11/23/12 saw Dr Harrington Challenger  . CAD, NATIVE VESSEL 07/22/2008    a.  s/p DES to LAD in 2009; s/p repeat DES to LAD 11/2008 (prox to previous stent).  . Constipation   . DIZZINESS, CHRONIC 02/27/2009  . DJD (degenerative joint disease)   . Edema 12/05/2008  . GERD (gastroesophageal reflux disease)   . History of kidney stones   . HYPERLIPIDEMIA-MIXED 07/22/2008  . HYPERTENSION, BENIGN 07/22/2008  . Lung nodule    RLL lung nodule (PET CT 1/10 negative);R parotid hypermetabolic on PET 11/9448  . Shortness of breath    with exertion  . Stroke [facility]) [phone]   TIA  , Left hand stinging , hurts to flex  . Testicular cancer [facility])     Patient Active Problem List   Diagnosis Date Noted  . Hematuria 10/23/2016  . Nephrolithiasis 10/23/2016  . Cough 05/11/2016  . GERD (gastroesophageal reflux disease) 05/11/2016  . 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 DIZZINESS, CHRONIC 02/27/2009  . Mixed hyperlipidemia 07/22/2008  . HYPERTENSION, BENIGN 07/22/2008  . CAD, NATIVE VESSEL 07/22/2008    Past Surgical History:  Procedure Laterality Date  . APPENDECTOMY    . Bilateral orchiectomy    . CORONARY ARTERY BYPASS GRAFT  N/A 12/01/2012   Procedure: CORONARY ARTERY BYPASS GRAFTING (CABG);  Surgeon: Gaye Pollack, MD;  LIMA to OM and RIMA to RCA  . KIDNEY STONE SURGERY  10/19/2016  . ROTATOR CUFF REPAIR     right  . TONSILLECTOMY    . TOTAL HIP ARTHROPLASTY Left   . Veins stripped          Home Medications    Prior to Admission medications   Medication Sig Start Date End Date Taking? Authorizing Provider  Ascorbic Acid (VITAMIN C) 1000 MG tablet Take 1,000 mg by mouth at bedtime.    [provider]  aspirin EC 81 MG tablet Take 162 mg by mouth daily.     [provider]  Aspirin-Acetaminophen-Caffeine (GOODY HEADACHE PO) Take 1-2 packets by mouth 2 (two) times daily as needed (pain/headaches).     [provider]  atorvastatin (LIPITOR) 40 MG tablet Take 1 tablet (40 mg total) by mouth daily. Patient taking differently: Take 20 mg by mouth every evening.  01/22/14   Fay Records, MD  b complex vitamins tablet Take 1 tablet by mouth 2 (two) times daily.    [provider]  cholecalciferol (VITAMIN D) 1000 UNITS tablet Take 1,000 Units by mouth 2 (two) times daily.     [provider]  clopidogrel (PLAVIX) 75 MG tablet Take 75  mg by mouth daily.    [provider]  Coenzyme Q10 (CO Q 10) 100 MG CAPS Take 100 mg by mouth daily.     [provider]  cyanocobalamin 500 MCG tablet Take 500 mcg by mouth 2 (two) times daily. Vitamin B12    [provider]  dicyclomine (BENTYL) 20 MG tablet Take 20 mg by mouth 3 (three) times daily before meals.    [provider]  Flaxseed, Linseed, (FLAX SEED OIL PO) Take 1 capsule by mouth daily.     [provider]  folic acid (FOLVITE) 1 MG tablet Take 1 mg by mouth 2 (two) times daily.    [provider]  hydroxypropyl methylcellulose / hypromellose (ISOPTO TEARS / GONIOVISC) 2.5 % ophthalmic solution Place 1 drop into both eyes 3 (three) times daily as needed for dry eyes.     [provider]  Lactobacillus Acidophilus POWD by Does not apply route.    [provider]  MAGNESIUM PO Take 1 tablet by mouth daily.     [provider]  Misc Natural Products (OSTEO BI-FLEX JOINT SHIELD PO) Take 1 tablet by mouth 2 (two) times daily.     [provider]  Multiple Vitamins-Minerals (CENTRUM SILVER 50+MEN) TABS Take 1 tablet by mouth 2 (two) times daily.    [provider]  naproxen sodium (ALEVE) 220 MG tablet Take 220-440 mg by mouth 2 (two) times daily as needed (for back pain).    [provider]  nitroGLYCERIN (NITROSTAT) 0.4 MG SL tablet Place 1 tablet (0.4 mg total) under the tongue every 5 (five) minutes as needed for chest pain. As directed 12/13/17   Fay Records, MD  Omega-3 Fatty Acids (FISH OIL) 500 MG CAPS Take 500 mg by mouth 2 (two) times daily.    [provider]  pantoprazole (PROTONIX) 40 MG tablet Take 1 tablet (40 mg) by mouth daily. Needs office visit for more refills. Patient taking differently: Take 40 mg by mouth 2 (two) times daily.  03/21/17   Golden Circle, FNP  sucralfate (CARAFATE) 1 g tablet Take 1 tablet (1 g total) by mouth 4 (four) times daily -  with meals and at bedtime. 12/13/17   Fay Records, MD  TURMERIC PO Take 1 capsule by mouth daily.    [provider]    Family History Family History  Problem Relation Age of Onset  . Aneurysm Brother   . Kidney disease Brother   . Heart disease Mother   . Hypertension Mother   . Diabetes Mother   . COPD Father   . Heart attack Neg Hx   . Stroke Neg Hx     Social History Social History   Tobacco Use  . Smoking status: Never Smoker  . Smokeless tobacco: Never Used  Substance Use Topics  . Alcohol use: Yes    Comment: rare  . Drug use: No     Allergies   Oxybutynin   Review of Systems Review of Systems  All other systems reviewed and are negative.    Physical Exam Updated Vital Signs BP 128/78    Pulse 75   Temp 99.4 F (37.4 C) (Oral)   Resp 19   Ht 1.829 m (6')   Wt 96.6 kg   SpO2 100%   BMI 28.89 kg/m   Physical Exam Vitals signs and nursing note reviewed.  Constitutional:      General: He is not in acute distress.  Appearance: Normal appearance. He is well-developed. He is not toxic-appearing.  HENT:     Head: Normocephalic and atraumatic.  Eyes:     General: Lids are normal.     Conjunctiva/sclera: Conjunctivae normal.     Pupils: Pupils are equal, round, and reactive to light.  Neck:     Musculoskeletal: Normal range of motion and neck supple.     Thyroid: No thyroid mass.     Trachea: No tracheal deviation.  Cardiovascular:     Rate and Rhythm: Normal rate and regular rhythm.     Heart sounds: Normal heart sounds. No murmur. No gallop.   Pulmonary:     Effort: Pulmonary effort is normal. No respiratory distress.     Breath sounds: Normal breath sounds. No stridor. No decreased breath sounds, wheezing, rhonchi or rales.  Abdominal:     General: Bowel sounds are normal. There is no distension.     Palpations: Abdomen is soft.     Tenderness: There is no abdominal tenderness. There is no rebound.  Musculoskeletal: Normal range of motion.        General: No tenderness.  Skin:    General: Skin is warm and dry.     Findings: No abrasion or rash.  Neurological:     Mental Status: He is alert and oriented to person, place, and time.     GCS: GCS eye subscore is 4. GCS verbal subscore is 5. GCS motor subscore is 6.     Cranial Nerves: No cranial nerve deficit.     Sensory: No sensory deficit.  Psychiatric:        Speech: Speech normal.        Behavior: Behavior normal.      ED Treatments / Results  Labs (all labs ordered are listed, but only abnormal results are displayed) Labs Reviewed  CULTURE, BLOOD (ROUTINE X 2)  CULTURE, BLOOD (ROUTINE X 2)  COMPREHENSIVE METABOLIC PANEL  CBC  BRAIN NATRIURETIC PEPTIDE  LACTIC ACID, PLASMA  I-STAT TROPONIN,  ED    EKG EKG Interpretation  Date/Time:  Monday September 25 2018 17:24:41 EDT Ventricular Rate:  76 PR Interval:    QRS Duration: 96 QT Interval:  374 QTC Calculation: 421 R Axis:   79 Text Interpretation:  Sinus rhythm Low voltage, precordial leads No significant change since last tracing Confirmed by Lacretia Leigh ([id number]) on 09/25/2018 5:41:11 PM   Radiology No results found.  Procedures Procedures (including critical care time)  Medications Ordered in ED Medications  0.9 %  sodium chloride infusion (has no administration in time range)     Initial Impression / Assessment and Plan / ED Course  I have reviewed the triage vital signs and the nursing notes.  Pertinent labs & imaging results that were available during my care of the patient were reviewed by me and considered in my medical decision making (see chart for details).       [name] was evaluated in Emergency Department on 09/25/2018 for the symptoms described in the history of present illness. He was evaluated in the context of the global COVID-19 pandemic, which necessitated consideration that the patient might be at risk for infection with the SARS-CoV-2 virus that causes COVID-19. Institutional protocols and algorithms that pertain to the evaluation of patients at risk for COVID-19 are in a state of rapid change based on information released by regulatory bodies including the CDC and federal and state organizations. These policies and algorithms were followed during the patient's care  in the ED.  Patient's chest x-ray was without acute findings.  Patient continued to complain of dyspnea so CT was performed which is negative as well 2.  He has no leukocytosis on his CBC.  Troponin and BNP are without acute elevation.  Patient is EKG without acute changes.  He has been afebrile here.  Patient states he feels well now to go home and strict return precautions given as well as quarantine instructions for possible CO  VID  Final Clinical Impressions(s) / ED Diagnoses   Final diagnoses:  Cough    ED Discharge Orders    None       Lacretia Leigh, MD 09/25/18 2000

## 2018-09-25 NOTE — ED Notes (Signed)
Bed: WTR6 Expected date:  Expected time:  Means of arrival:  Comments: 

## 2018-09-25 NOTE — Discharge Instructions (Addendum)
Persons with COVID-19 who have symptoms and were directed to care for themselves at home may discontinue isolation under the following conditions: At least 3 days (72 hours) have passed since recovery defined as resolution of fever without the use of fever-reducing medications and Improvement in respiratory symptoms (e.g., cough, shortness of breath); and, At least 7 days have passed since symptoms first appeared.     Person Under Monitoring Name: [name]  Location: [place] [address]   Infection Prevention Recommendations for Individuals Confirmed to have, or Being Evaluated for, 2019 Novel Coronavirus (COVID-19) Infection Who Receive Care at Home  Individuals who are confirmed to have, or are being evaluated for, COVID-19 should follow the prevention steps below until a healthcare provider or local or state health department says they can return to normal activities.  Stay home except to get medical care You should restrict activities outside your home, except for getting medical care. Do not go to work, school, or public areas, and do not use public transportation or taxis.  Call ahead before visiting your doctor Before your medical appointment, call the healthcare provider and tell them that you have, or are being evaluated for, COVID-19 infection. This will help the healthcare providers office take steps to keep other people from getting infected. Ask your healthcare provider to call the local or state health department.  Monitor your symptoms Seek prompt medical attention if your illness is worsening (e.g., difficulty breathing). Before going to your medical appointment, call the healthcare provider and tell them that you have, or are being evaluated for, COVID-19 infection. Ask your healthcare provider to call the local or state health department.  Wear a facemask You should wear a facemask that covers your nose and mouth when you are in  the same room with other people and when you visit a healthcare provider. People who live with or visit you should also wear a facemask while they are in the same room with you.  Separate yourself from other people in your home As much as possible, you should stay in a different room from other people in your home. Also, you should use a separate bathroom, if available.  Avoid sharing household items You should not share dishes, drinking glasses, cups, eating utensils, towels, bedding, or other items with other people in your home. After using these items, you should wash them thoroughly with soap and water.  Cover your coughs and sneezes Cover your mouth and nose with a tissue when you cough or sneeze, or you can cough or sneeze into your sleeve. Throw used tissues in a lined trash can, and immediately wash your hands with soap and water for at least 20 seconds or use an alcohol-based hand rub.  Wash your [facility] your hands often and thoroughly with soap and water for at least 20 seconds. You can use an alcohol-based hand sanitizer if soap and water are not available and if your hands are not visibly dirty. Avoid touching your eyes, nose, and mouth with unwashed hands.   Prevention Steps for Caregivers and Household Members of Individuals Confirmed to have, or Being Evaluated for, COVID-19 Infection Being Cared for in the Home  If you live with, or provide care at home for, a person confirmed to have, or being evaluated for, COVID-19 infection please follow these guidelines to prevent infection:  Follow healthcare providers instructions Make sure that you understand and can help the patient follow any healthcare provider instructions for all  care.  Provide for the patients basic needs You should help the patient with basic needs in the home and provide support for getting groceries, prescriptions, and other personal needs.  Monitor the patients symptoms If they are getting  sicker, call his or her medical provider and tell them that the patient has, or is being evaluated for, COVID-19 infection. This will help the healthcare providers office take steps to keep other people from getting infected. Ask the healthcare provider to call the local or state health department.  Limit the number of people who have contact with the patient If possible, have only one caregiver for the patient. Other household members should stay in another home or place of residence. If this is not possible, they should stay in another room, or be separated from the patient as much as possible. Use a separate bathroom, if available. Restrict visitors who do not have an essential need to be in the home.  Keep older adults, very young children, and other sick people away from the patient Keep older adults, very young children, and those who have compromised immune systems or chronic health conditions away from the patient. This includes people with chronic heart, lung, or kidney conditions, diabetes, and cancer.  Ensure good ventilation Make sure that shared spaces in the home have good air flow, such as from an air conditioner or an opened window, weather permitting.  Wash your hands often Wash your hands often and thoroughly with soap and water for at least 20 seconds. You can use an alcohol based hand sanitizer if soap and water are not available and if your hands are not visibly dirty. Avoid touching your eyes, nose, and mouth with unwashed hands. Use disposable paper towels to dry your hands. If not available, use dedicated cloth towels and replace them when they become wet.  Wear a facemask and gloves Wear a disposable facemask at all times in the room and gloves when you touch or have contact with the patients blood, body fluids, and/or secretions or excretions, such as sweat, saliva, sputum, nasal mucus, vomit, urine, or feces.  Ensure the mask fits over your nose and mouth tightly,  and do not touch it during use. Throw out disposable facemasks and gloves after using them. Do not reuse. Wash your hands immediately after removing your facemask and gloves. If your personal clothing becomes contaminated, carefully remove clothing and launder. Wash your hands after handling contaminated clothing. Place all used disposable facemasks, gloves, and other waste in a lined container before disposing them with other household waste. Remove gloves and wash your hands immediately after handling these items.  Do not share dishes, glasses, or other household items with the patient Avoid sharing household items. You should not share dishes, drinking glasses, cups, eating utensils, towels, bedding, or other items with a patient who is confirmed to have, or being evaluated for, COVID-19 infection. After the person uses these items, you should wash them thoroughly with soap and water.  Wash laundry thoroughly Immediately remove and wash clothes or bedding that have blood, body fluids, and/or secretions or excretions, such as sweat, saliva, sputum, nasal mucus, vomit, urine, or feces, on them. Wear gloves when handling laundry from the patient. Read and follow directions on labels of laundry or clothing items and detergent. In general, wash and dry with the warmest temperatures recommended on the label.  Clean all areas the individual has used often Clean all touchable surfaces, such as counters, tabletops, doorknobs, bathroom fixtures, toilets, phones,  keyboards, tablets, and bedside tables, every day. Also, clean any surfaces that may have blood, body fluids, and/or secretions or excretions on them. Wear gloves when cleaning surfaces the patient has come in contact with. Use a diluted bleach solution (e.g., dilute bleach with 1 part bleach and 10 parts water) or a household disinfectant with a label that says EPA-registered for coronaviruses. To make a bleach solution at home, add 1 tablespoon  of bleach to 1 quart (4 cups) of water. For a larger supply, add  cup of bleach to 1 gallon (16 cups) of water. Read labels of cleaning products and follow recommendations provided on product labels. Labels contain instructions for safe and effective use of the cleaning product including precautions you should take when applying the product, such as wearing gloves or eye protection and making sure you have good ventilation during use of the product. Remove gloves and wash hands immediately after cleaning.  Monitor yourself for signs and symptoms of illness Caregivers and household members are considered close contacts, should monitor their health, and will be asked to limit movement outside of the home to the extent possible. Follow the monitoring steps for close contacts listed on the symptom monitoring form.   ? If you have additional questions, contact your local health department or call the epidemiologist on call at [phone] (available 24/7). ? This guidance is subject to change. For the most up-to-date guidance from [facility], please refer to their website: [email or web address]

## 2018-09-25 NOTE — ED Triage Notes (Signed)
Per Pt, Pt sent by PCP to be tested for Covid. Pt states apprx 6 days ago his stool was loose, then progressed to being charcoal color. Denies seeing any blood in stool. Pt feels fatigued, lost his taste, and is very thirsty. Pt states he has had a little bit of coughing, and little bit of sneezing, does not recall having seasonal allergies.

## 2018-09-30 LAB — CULTURE, BLOOD (ROUTINE X 2)
Culture: NO GROWTH
Culture: NO GROWTH
Special Requests: ADEQUATE
Special Requests: ADEQUATE

## 2018-11-17 DIAGNOSIS — Z20828 Contact with and (suspected) exposure to other viral communicable diseases: Secondary | ICD-10-CM | POA: Diagnosis not present

## 2018-12-29 ENCOUNTER — Other Ambulatory Visit: Payer: Self-pay | Admitting: Internal Medicine

## 2019-01-17 ENCOUNTER — Emergency Department (HOSPITAL_COMMUNITY)
Admission: EM | Admit: 2019-01-17 | Discharge: 2019-01-17 | Disposition: A | Discharge status: Home / Self Care | Payer: No Typology Code available for payment source | Attending: Emergency Medicine | Admitting: Emergency Medicine

## 2019-01-17 ENCOUNTER — Emergency Department (HOSPITAL_COMMUNITY): Payer: No Typology Code available for payment source

## 2019-01-17 ENCOUNTER — Other Ambulatory Visit: Payer: Self-pay

## 2019-01-17 DIAGNOSIS — Z96642 Presence of left artificial hip joint: Secondary | ICD-10-CM | POA: Insufficient documentation

## 2019-01-17 DIAGNOSIS — N200 Calculus of kidney: Secondary | ICD-10-CM | POA: Insufficient documentation

## 2019-01-17 DIAGNOSIS — Z79899 Other long term (current) drug therapy: Secondary | ICD-10-CM | POA: Diagnosis not present

## 2019-01-17 DIAGNOSIS — I2510 Atherosclerotic heart disease of native coronary artery without angina pectoris: Secondary | ICD-10-CM | POA: Insufficient documentation

## 2019-01-17 DIAGNOSIS — Z7982 Long term (current) use of aspirin: Secondary | ICD-10-CM | POA: Insufficient documentation

## 2019-01-17 DIAGNOSIS — Z951 Presence of aortocoronary bypass graft: Secondary | ICD-10-CM | POA: Diagnosis not present

## 2019-01-17 DIAGNOSIS — I10 Essential (primary) hypertension: Secondary | ICD-10-CM | POA: Diagnosis not present

## 2019-01-17 DIAGNOSIS — Z8673 Personal history of transient ischemic attack (TIA), and cerebral infarction without residual deficits: Secondary | ICD-10-CM | POA: Diagnosis not present

## 2019-01-17 DIAGNOSIS — K449 Diaphragmatic hernia without obstruction or gangrene: Secondary | ICD-10-CM | POA: Diagnosis not present

## 2019-01-17 DIAGNOSIS — Z7902 Long term (current) use of antithrombotics/antiplatelets: Secondary | ICD-10-CM | POA: Insufficient documentation

## 2019-01-17 DIAGNOSIS — K8020 Calculus of gallbladder without cholecystitis without obstruction: Secondary | ICD-10-CM | POA: Diagnosis not present

## 2019-01-17 DIAGNOSIS — R350 Frequency of micturition: Secondary | ICD-10-CM | POA: Diagnosis present

## 2019-01-17 DIAGNOSIS — N132 Hydronephrosis with renal and ureteral calculous obstruction: Secondary | ICD-10-CM | POA: Diagnosis not present

## 2019-01-17 LAB — CBC WITH DIFFERENTIAL/PLATELET
Abs Immature Granulocytes: 0.01 10*3/uL (ref 0.00–0.07)
Basophils Absolute: 0 10*3/uL (ref 0.0–0.1)
Basophils Relative: 1 %
Eosinophils Absolute: 0.2 10*3/uL (ref 0.0–0.5)
Eosinophils Relative: 4 %
HCT: 42.6 % (ref 39.0–52.0)
Hemoglobin: 13.7 g/dL (ref 13.0–17.0)
Immature Granulocytes: 0 %
Lymphocytes Relative: 25 %
Lymphs Abs: 1.1 10*3/uL (ref 0.7–4.0)
MCH: 30.9 pg (ref 26.0–34.0)
MCHC: 32.2 g/dL (ref 30.0–36.0)
MCV: 95.9 fL (ref 80.0–100.0)
Monocytes Absolute: 0.6 10*3/uL (ref 0.1–1.0)
Monocytes Relative: 15 %
Neutro Abs: 2.4 10*3/uL (ref 1.7–7.7)
Neutrophils Relative %: 55 %
Platelets: 195 10*3/uL (ref 150–400)
RBC: 4.44 MIL/uL (ref 4.22–5.81)
RDW: 13.7 % (ref 11.5–15.5)
WBC: 4.3 10*3/uL (ref 4.0–10.5)
nRBC: 0 % (ref 0.0–0.2)

## 2019-01-17 LAB — URINALYSIS, ROUTINE W REFLEX MICROSCOPIC
Bilirubin Urine: NEGATIVE
Glucose, UA: NEGATIVE mg/dL
Ketones, ur: NEGATIVE mg/dL
Leukocytes,Ua: NEGATIVE
Nitrite: NEGATIVE
Protein, ur: 100 mg/dL — AB
RBC / HPF: >50 RBC/hpf — ABNORMAL HIGH (ref 0–5)
Specific Gravity, Urine: 1.018 (ref 1.005–1.030)
pH: 5 (ref 5.0–8.0)

## 2019-01-17 LAB — BASIC METABOLIC PANEL
Anion gap: 8 (ref 5–15)
BUN: 20 mg/dL (ref 8–23)
CO2: 28 mmol/L (ref 22–32)
Calcium: 9.5 mg/dL (ref 8.9–10.3)
Chloride: 106 mmol/L (ref 98–111)
Creatinine, Ser: 0.9 mg/dL (ref 0.61–1.24)
GFR calc Af Amer: >60 mL/min (ref >60)
GFR calc non Af Amer: >60 mL/min (ref >60)
Glucose, Bld: 100 mg/dL — ABNORMAL HIGH (ref 70–99)
Potassium: 4.3 mmol/L (ref 3.5–5.1)
Sodium: 142 mmol/L (ref 135–145)

## 2019-01-17 MED ORDER — TAMSULOSIN HCL 0.4 MG PO CAPS: 0.4000 mg | ORAL_CAPSULE | Freq: Every day | ORAL | 0 refills | Status: DC

## 2019-01-17 MED ORDER — HYDROCODONE-ACETAMINOPHEN 5-325 MG PO TABS: 1.0000 | ORAL_TABLET | Freq: Four times a day (QID) | ORAL | 0 refills | Status: DC | PRN

## 2019-01-17 NOTE — ED Triage Notes (Signed)
Pt reports having blood in his urine x 2 weeks with mild lower back pain. Hx of kidney stones. No distress is noted at triage.

## 2019-01-17 NOTE — ED Provider Notes (Signed)
[facility] EMERGENCY DEPARTMENT Provider Note   CSN: [id number] Arrival date & time: 01/17/19  1047     History   Chief Complaint Chief Complaint  Patient presents with  . Hematuria    HPI [name] is a 78 y.o. male.     The history is provided by the patient and medical records. No language interpreter was used.     78 year old male with history of prior kidney stones presenting for evaluation of bloody urine.  Patient report for the past 2 weeks he has noticed blood in his urine.  He states that has been persistent symptoms with increased urinary frequency.  He does not have any significant associated pain with that aside from mild lower back pain which is not new.  He denies any associated fever chills no lightheadedness or dizziness no abdominal pain, no burning sensation while urinating and no injury.  He is currently on Plavix.  He has had prior history of kidney stones.  Prior history of testicular cancer diagnosed 20 years ago and receiving appropriate treatment.  He denies any fever, night sweats, or abnormal weight changes.  Past Medical History:  Diagnosis Date  . Anginal pain (HCC)    pressure- last time 11/23/12 saw Dr Harrington Challenger  . CAD, NATIVE VESSEL 07/22/2008    a.  s/p DES to LAD in 2009; s/p repeat DES to LAD 11/2008 (prox to previous stent).  . Constipation   . DIZZINESS, CHRONIC 02/27/2009  . DJD (degenerative joint disease)   . Edema 12/05/2008  . GERD (gastroesophageal reflux disease)   . History of kidney stones   . HYPERLIPIDEMIA-MIXED 07/22/2008  . HYPERTENSION, BENIGN 07/22/2008  . Lung nodule    RLL lung nodule (PET CT 1/10 negative);R parotid hypermetabolic on PET 06/7508  . Shortness of breath    with exertion  . Stroke [facility]) [phone]   TIA  , Left hand stinging , hurts to flex  . Testicular cancer [facility])     Patient Active Problem List   Diagnosis Date Noted  . Hematuria 10/23/2016  . Nephrolithiasis 10/23/2016  . Cough  05/11/2016  . GERD (gastroesophageal reflux disease) 05/11/2016  . 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 DIZZINESS, CHRONIC 02/27/2009  . Mixed hyperlipidemia 07/22/2008  . HYPERTENSION, BENIGN 07/22/2008  . CAD, NATIVE VESSEL 07/22/2008    Past Surgical History:  Procedure Laterality Date  . APPENDECTOMY    . Bilateral orchiectomy    . CORONARY ARTERY BYPASS GRAFT N/A 12/01/2012   Procedure: CORONARY ARTERY BYPASS GRAFTING (CABG);  Surgeon: Gaye Pollack, MD;  LIMA to OM and RIMA to RCA  . KIDNEY STONE SURGERY  10/19/2016  . ROTATOR CUFF REPAIR     right  . TONSILLECTOMY    . TOTAL HIP ARTHROPLASTY Left   . Veins stripped          Home Medications    Prior to Admission medications   Medication Sig Start Date End Date Taking? Authorizing Provider  Ascorbic Acid (VITAMIN C) 1000 MG tablet Take 1,000 mg by mouth at bedtime.    [provider]  aspirin EC 81 MG tablet Take 162 mg by mouth daily.     [provider]  Aspirin-Acetaminophen-Caffeine (GOODY HEADACHE PO) Take 1-2 packets by mouth 2 (two) times daily as needed (pain/headaches).     [provider]  atorvastatin (LIPITOR) 40 MG tablet Take 1 tablet (40 mg total) by mouth daily. Patient taking differently: Take 20 mg by mouth  every evening.  01/22/14   Fay Records, MD  b complex vitamins tablet Take 1 tablet by mouth 2 (two) times daily.    [provider]  cholecalciferol (VITAMIN D) 1000 UNITS tablet Take 1,000 Units by mouth 2 (two) times daily.     [provider]  clopidogrel (PLAVIX) 75 MG tablet Take 75 mg by mouth daily.    [provider]  Coenzyme Q10 (CO Q 10) 100 MG CAPS Take 100 mg by mouth daily.     [provider]  cyanocobalamin 500 MCG tablet Take 500 mcg by mouth 2 (two) times daily. Vitamin B12    [provider]  dicyclomine (BENTYL) 20 MG tablet Take 20 mg by mouth 3 (three) times daily before meals.    [provider]  Flaxseed,  Linseed, (FLAX SEED OIL PO) Take 1 capsule by mouth daily.     [provider]  folic acid (FOLVITE) 1 MG tablet Take 1 mg by mouth 2 (two) times daily.    [provider]  hydroxypropyl methylcellulose / hypromellose (ISOPTO TEARS / GONIOVISC) 2.5 % ophthalmic solution Place 1 drop into both eyes 3 (three) times daily as needed for dry eyes.    [provider]  Lactobacillus Acidophilus POWD by Does not apply route.    [provider]  MAGNESIUM PO Take 1 tablet by mouth daily.     [provider]  Menthol, Topical Analgesic, (ICY HOT POWER) 16 % GEL Apply 1 application topically daily as needed (pain).    [provider]  Misc Natural Products (OSTEO BI-FLEX JOINT SHIELD PO) Take 1 tablet by mouth 2 (two) times daily.     [provider]  Multiple Vitamins-Minerals (CENTRUM SILVER 50+MEN) TABS Take 1 tablet by mouth 2 (two) times daily.    [provider]  naproxen sodium (ALEVE) 220 MG tablet Take 220 mg by mouth 2 (two) times daily as needed (for back pain).     [provider]  Nerve Stimulator (TENS THERAPY PAIN RELIEF) DEVI 1 application by Does not apply route daily as needed.    [provider]  nitroGLYCERIN (NITROSTAT) 0.4 MG SL tablet Place 1 tablet (0.4 mg total) under the tongue every 5 (five) minutes as needed for chest pain. As directed 12/13/17   Fay Records, MD  Omega-3 Fatty Acids (FISH OIL) 500 MG CAPS Take 500 mg by mouth 2 (two) times daily.    [provider]  pantoprazole (PROTONIX) 40 MG tablet Take 1 tablet (40 mg) by mouth daily. Needs office visit for more refills. Patient taking differently: Take 40 mg by mouth 2 (two) times daily.  03/21/17   Golden Circle, FNP  sucralfate (CARAFATE) 1 g tablet TAKE 1 TABLET BY MOUTH FOUR TIMES DAILY(WITH MEALS AND AT BEDTIME) 12/29/18   Fay Records, MD  TURMERIC PO Take 1 capsule by mouth daily.    [provider]    Family  History Family History  Problem Relation Age of Onset  . Aneurysm Brother   . Kidney disease Brother   . Heart disease Mother   . Hypertension Mother   . Diabetes Mother   . COPD Father   . Heart attack Neg Hx   . Stroke Neg Hx     Social History Social History   Tobacco Use  . Smoking status: Never Smoker  . Smokeless tobacco: Never Used  Substance Use Topics  . Alcohol use: Yes  Comment: rare  . Drug use: No     Allergies   Oxybutynin   Review of Systems Review of Systems  All other systems reviewed and are negative.    Physical Exam Updated Vital Signs BP 114/75 (BP Location: Right Arm)   Pulse 72   Temp 97.9 F (36.6 C) (Oral)   Resp 16   SpO2 99%   Physical Exam Vitals signs and nursing note reviewed.  Constitutional:      General: He is not in acute distress.    Appearance: He is well-developed.  HENT:     Head: Atraumatic.  Eyes:     Conjunctiva/sclera: Conjunctivae normal.  Neck:     Musculoskeletal: Neck supple.  Cardiovascular:     Rate and Rhythm: Normal rate and regular rhythm.     Pulses: Normal pulses.     Heart sounds: Normal heart sounds.  Pulmonary:     Effort: Pulmonary effort is normal.     Breath sounds: Normal breath sounds.  Abdominal:     General: Abdomen is flat. There is no distension.     Palpations: Abdomen is soft.     Tenderness: There is no abdominal tenderness. There is no right CVA tenderness or left CVA tenderness.  Skin:    Findings: No rash.  Neurological:     Mental Status: He is alert and oriented to person, place, and time.  Psychiatric:        Mood and Affect: Mood normal.      ED Treatments / Results  Labs (all labs ordered are listed, but only abnormal results are displayed) Labs Reviewed  URINALYSIS, ROUTINE W REFLEX MICROSCOPIC - Abnormal; Notable for the following components:      Result Value   Color, Urine AMBER (*)    APPearance HAZY (*)    Hgb urine dipstick LARGE (*)    Protein, ur  100 (*)    RBC / HPF >50 (*)    Bacteria, UA RARE (*)    All other components within normal limits  BASIC METABOLIC PANEL - Abnormal; Notable for the following components:   Glucose, Bld 100 (*)    All other components within normal limits  CBC WITH DIFFERENTIAL/PLATELET    EKG None  Radiology Ct Renal Stone Study  Result Date: 01/17/2019 CLINICAL DATA:  Hematuria EXAM: CT ABDOMEN AND PELVIS WITHOUT CONTRAST TECHNIQUE: Multidetector CT imaging of the abdomen and pelvis was performed following the standard protocol without oral or IV contrast. COMPARISON:  Oct 23, 2016 FINDINGS: Lower chest: There is a calcified granuloma in the right base. There is mild scarring and atelectasis in the lung bases. There are foci of coronary artery calcification. There is a moderate hiatal hernia. Hepatobiliary: No focal liver lesions are evident on this noncontrast enhanced study. There is a gallstone in the neck of the gallbladder. The gallbladder wall is not appreciably thickened. There is no evident biliary duct dilatation. Pancreas: No pancreatic mass or inflammatory focus. Spleen: No splenic lesions are evident. Adrenals/Urinary Tract: Adrenals bilaterally appear unremarkable. There is a lobulated cyst arising from the upper pole of the right kidney measuring 9.3 x 6.9 cm. There is a parapelvic cyst on the right measuring 2.3 x 2.3 cm. There is slight hydronephrosis on the right. There is no hydronephrosis on the left. There is a 2 mm calculus in the lower pole of the right kidney. There is a 5 x 4 mm calculus in the lower pole left kidney with scattered 1-2 mm calculi  elsewhere in the lower pole left kidney. There is a calculus slightly beyond the ureteropelvic junction in the proximal left ureter at the L4 level measuring 6 x 5 mm. No other ureteral calculi are evident. Urinary bladder is midline with wall thickness within normal limits. Note that there are several phleboliths in the pelvis near but separate  from the distal ureters. Stomach/Bowel: There is moderate stool in the colon. There is no appreciable bowel wall or mesenteric thickening. There is no appreciable bowel obstruction. There is no evident free air or portal venous air. Terminal ileum appears unremarkable. Vascular/Lymphatic: There is aortic and iliac artery atherosclerosis. No evident aneurysm. No adenopathy evident in the abdomen or pelvis. Reproductive: There are prostatic calculi. Prostate and seminal vesicles normal in size and contour. No pelvic mass evident. Other: Appendix absent. No periappendiceal region inflammatory change. No abscess or ascites evident in the abdomen or pelvis. There is a ventral hernia immediately inferior to the xiphoid process of the sternum which contains fat and bowel. No bowel compromise. Musculoskeletal: There is a total hip replacement on the left. There is degenerative change in the lumbar spine. There is 3 mm of anterolisthesis of L4 on L5, likely due to underlying spondylosis. There is severe spinal stenosis at L4-5 due to bony hypertrophy and disc protrusion. Moderate spinal stenosis is present at L3-4 due to similar factors. No blastic or lytic bone lesions are evident. There is no intramuscular lesion. IMPRESSION: 1. Slight hydronephrosis on the right. 6 x 5 mm calculus in the proximal right ureter at the L4 level. 2.  Nonobstructing calculi in each kidney.  Prostatic calculi noted. 3. Gallstone noted in neck of gallbladder. No gallbladder wall thickening evident. 4. No bowel obstruction. No abscess in the abdomen or pelvis. Appendix absent. 5. Moderate hiatal hernia. Ventral hernia in the anterior upper abdomen slightly inferior to the xiphoid process of the sternum containing fat and bowel but no bowel compromise. 6. Severe spinal stenosis at L4-5 due to bony hypertrophy and disc protrusion. Moderate spinal stenosis at L3-4 due to similar factors. 7.  Status post total hip replacement on the left. 8.   Aortoiliac atherosclerosis. Electronically Signed   By: Lowella Grip III M.D.   On: 01/17/2019 15:09    Procedures Procedures (including critical care time)  Medications Ordered in ED Medications - No data to display   Initial Impression / Assessment and Plan / ED Course  I have reviewed the triage vital signs and the nursing notes.  Pertinent labs & imaging results that were available during my care of the patient were reviewed by me and considered in my medical decision making (see chart for details).        BP 114/75 (BP Location: Right Arm)   Pulse 72   Temp 97.9 F (36.6 C) (Oral)   Resp 16   Ht 6' (1.829 m)   Wt 96.6 kg   SpO2 99%   BMI 28.89 kg/m    Final Clinical Impressions(s) / ED Diagnoses   Final diagnoses:  Kidney stone on right side    ED Discharge Orders         Ordered    HYDROcodone-acetaminophen (NORCO/VICODIN) 5-325 MG tablet  Every 6 hours PRN     01/17/19 1526    tamsulosin (FLOMAX) 0.4 MG CAPS capsule  Daily     01/17/19 1526         12:33 PM Patient here with recurrent hematuria ongoing for the past 2 weeks.Remote history of  kidney stones.  He does not have any significant CVA tenderness or abdominal tenderness on exam.  UA obtained today does show large amount of hemoglobin and urine dipsticks but no obvious signs to suggest infection.  Will obtain abdominal pelvis CT scan, check labs, and continue to monitor closely.  Patient otherwise well-appearing in no acute discomfort.  3:22 PM CT renal stone demonstrate slight hydronephrosis on the right with a 6 x 5 mm calculus in the proximal right ureter.  This is likely the cause of his hematuria.  However, I encourage patient to follow-up closely with neurology for further evaluation and to rule out malignancy.  Otherwise he is stable for discharge.  Care discussed with Dr. Venora Maples.  CT also mention severe spinal stenosis at L4/5 which is likely the cause of his chronic back pain.  Return  precaution discussed.   Domenic Moras, PA-C 01/17/19 [place], MD 01/17/19 [phone]

## 2019-01-17 NOTE — Discharge Instructions (Signed)
You have a 6x58mm kidney stone on the right side causing blood in your urine.  Take medications prescribed.  Call and follow up closely with urology for further care. Return if you have any concerns.

## 2019-01-19 ENCOUNTER — Other Ambulatory Visit: Payer: Self-pay

## 2019-02-27 IMAGING — CT CT RENAL STONE PROTOCOL
2 of 4 series · 16 of 46 positions shown, 18 images · non-contrast
Comparison: CT scan of October 13, 2016.

CLINICAL DATA: Lower abdominal pain and gross hematuria for 2
weeks.

EXAM:
CT ABDOMEN AND PELVIS WITHOUT CONTRAST
TECHNIQUE: Multidetector CT imaging of the abdomen and pelvis was performed
following the standard protocol without IV contrast.

[Series 3: stone study 5.0 i30f 2 · axial · 0.88mm/px · z∈[+840,+1320]mm · 13 of 106 slices shown, 15 images]
[im 5/106  soft-tissue]
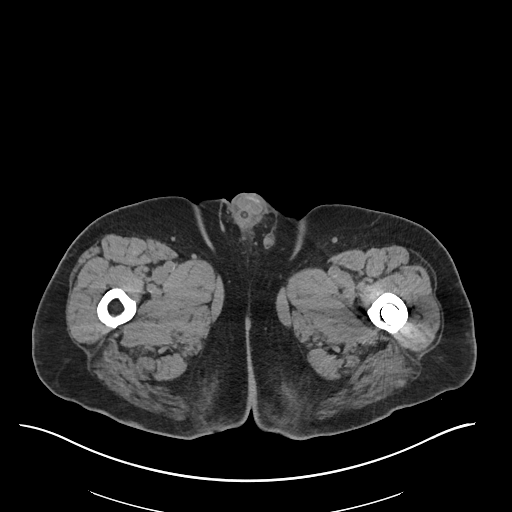
[im 5/106  bone]
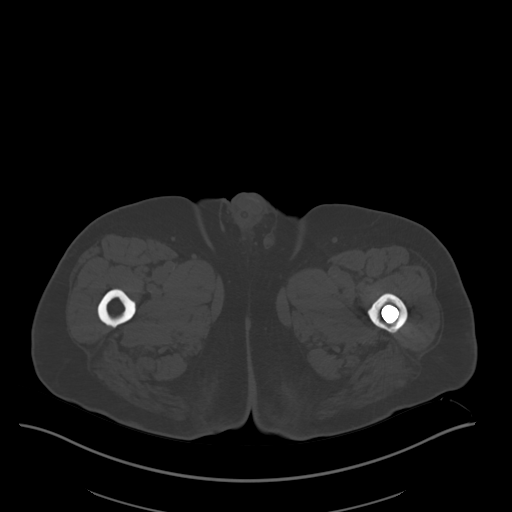
[im 14/106  soft-tissue]
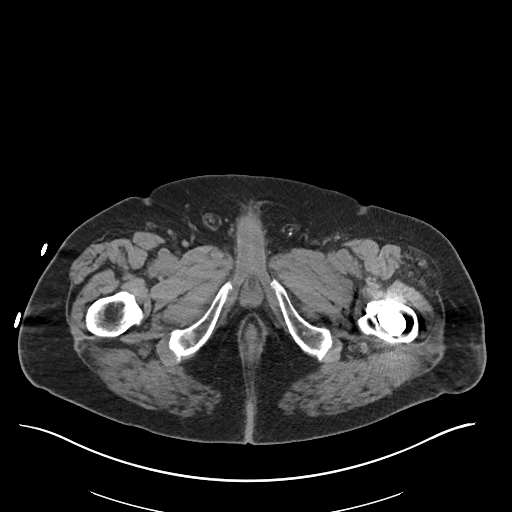
[im 22/106  soft-tissue]
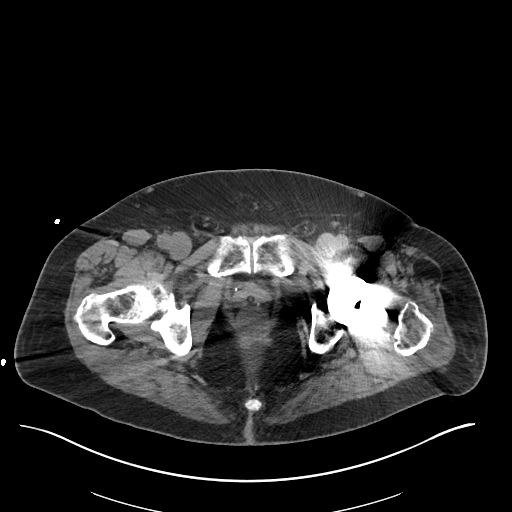
[im 31/106  soft-tissue]
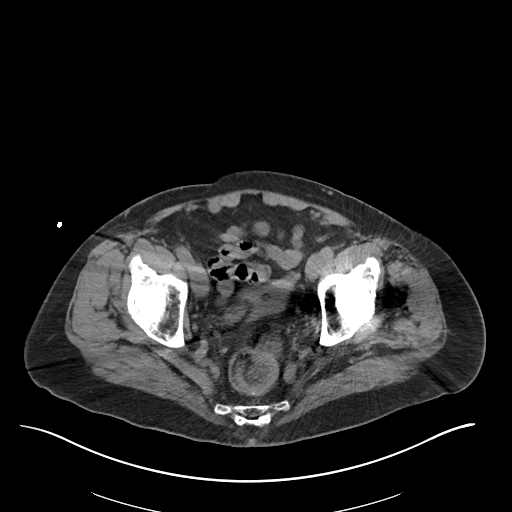
[im 36/106  soft-tissue]
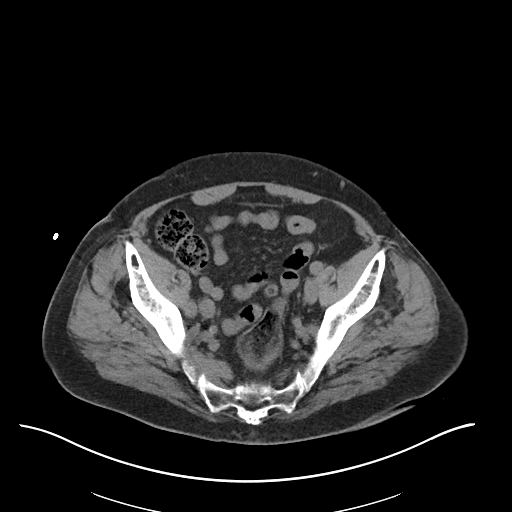
[im 44/106  soft-tissue]
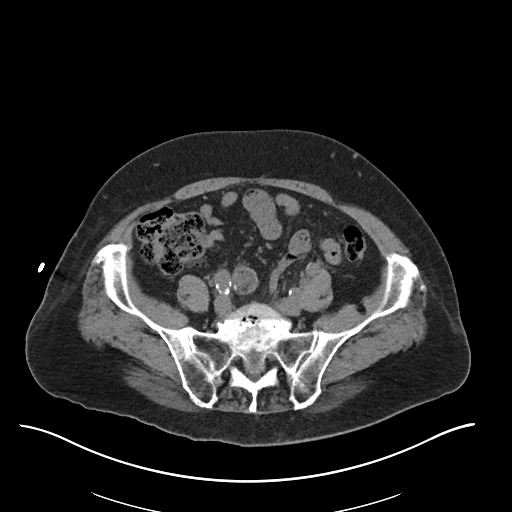
[im 53/106  soft-tissue]
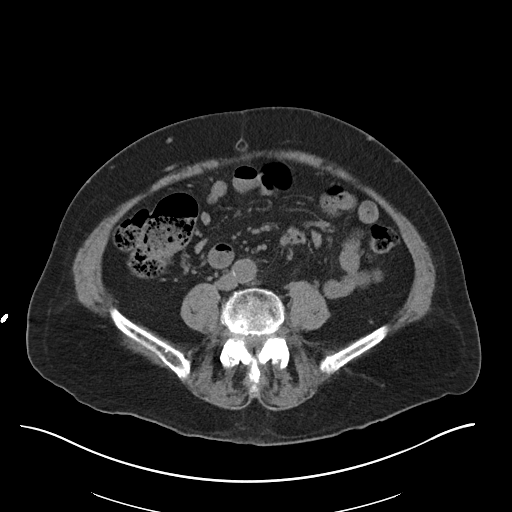
[im 62/106  soft-tissue]
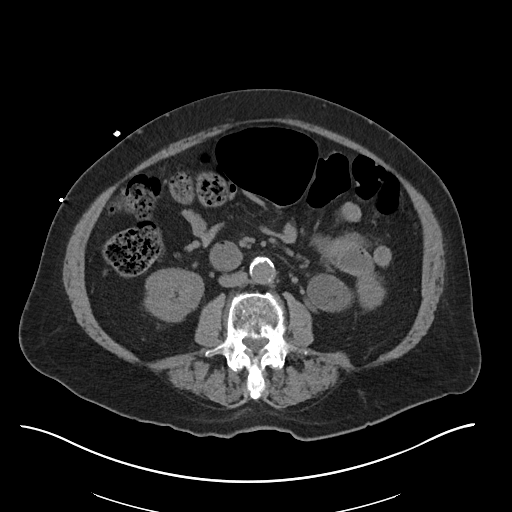
[im 71/106  soft-tissue]
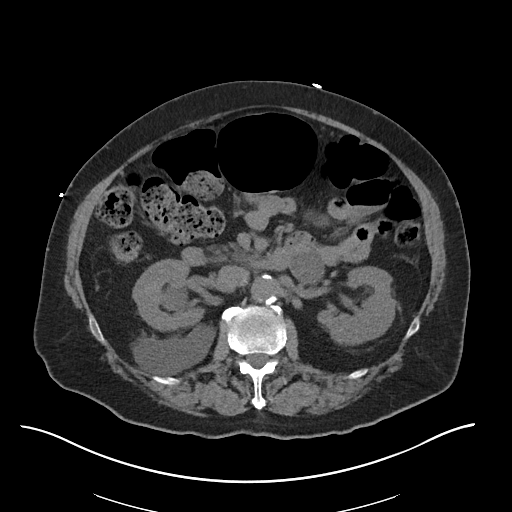
[im 71/106  bone]
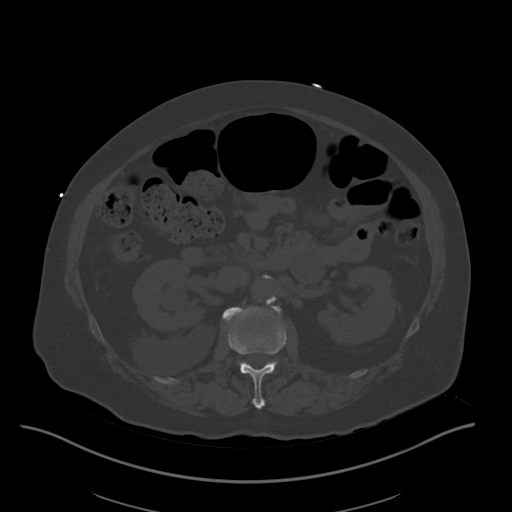
[im 75/106  soft-tissue]
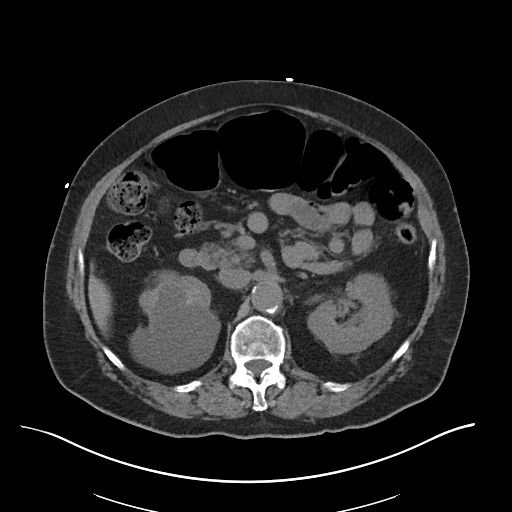
[im 84/106  soft-tissue]
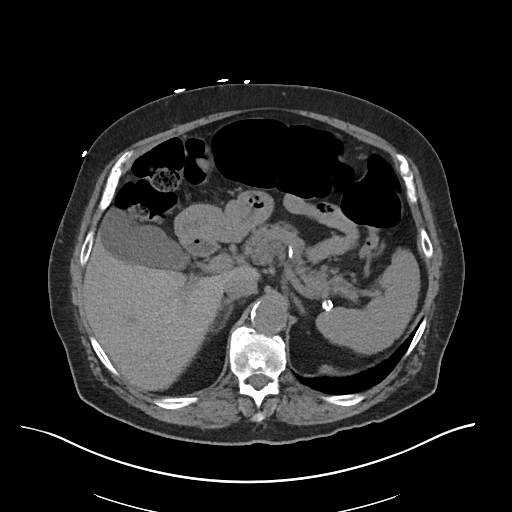
[im 92/106  soft-tissue]
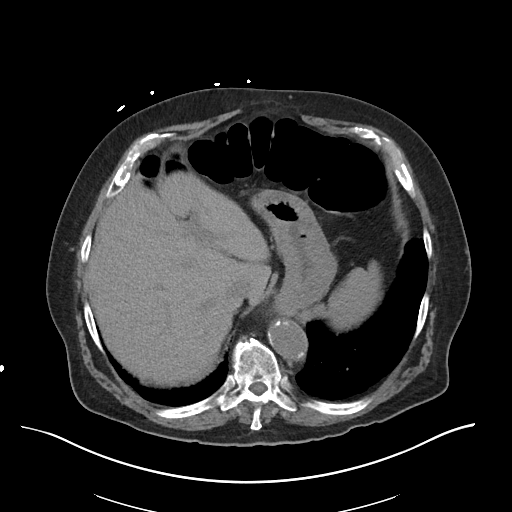
[im 101/106  soft-tissue]
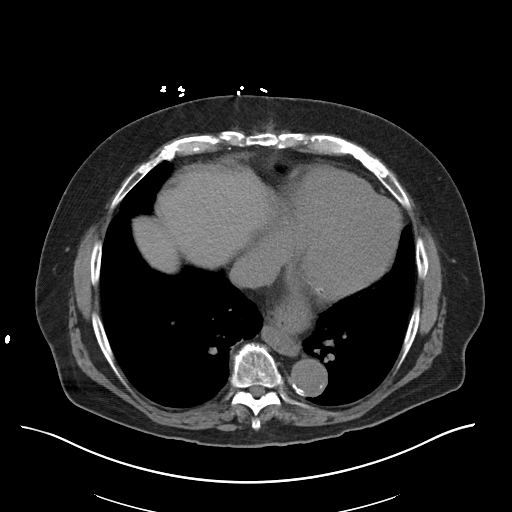

[Series 6: coronal soft tissue · coronal · 0.89mm/px · 3 of 113 slices shown]
[im 38/113  soft-tissue]
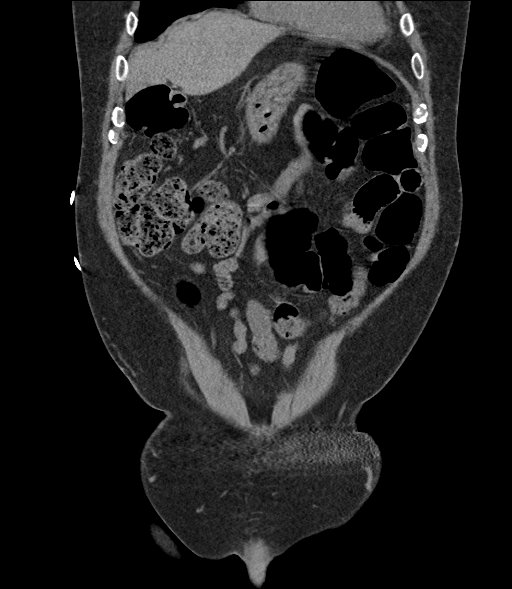
[im 50/113  soft-tissue]
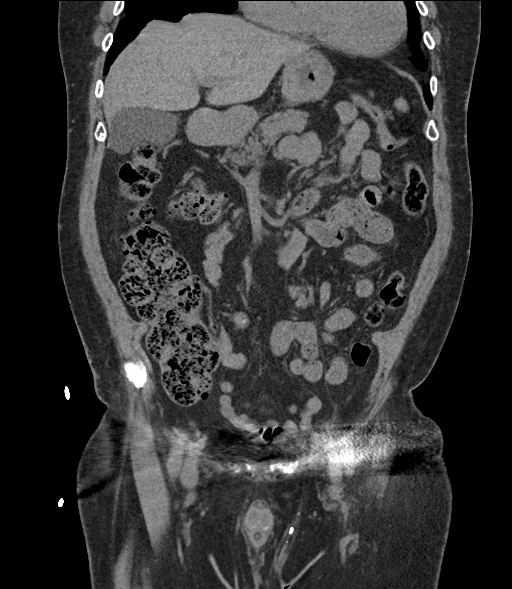
[im 63/113  soft-tissue]
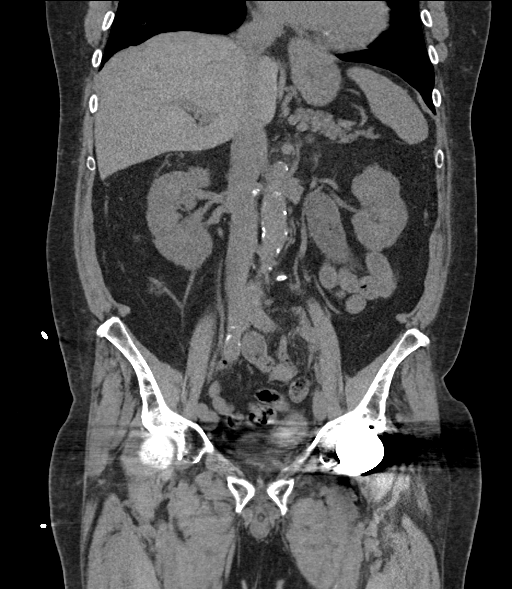

[16 of 46 positions shown; findings below may reference images not displayed]

FINDINGS: Lower chest: Stable calcified granuloma is noted in right lower
lobe. No acute abnormality is noted. Stable moderate size
sliding-type hiatal hernia.

Hepatobiliary: Solitary gallstone is noted. No focal abnormality is
seen in the liver on these unenhanced images.

Pancreas: Unremarkable. No pancreatic ductal dilatation or
surrounding inflammatory changes.

Spleen: Normal in size without focal abnormality.

Adrenals/Urinary Tract: Adrenal glands are unremarkable. Stable
large right renal cyst is noted. Bilateral nephrolithiasis is noted.
No hydronephrosis is noted. Distal ureteral calculi cannot be
excluded due to scatter artifact arising from left hip prosthesis.
Foley catheter is noted in urinary bladder which is nearly
completely decompressed. At least 2 bladder calculi are noted.

Stomach/Bowel: There is no evidence of bowel obstruction. No
inflammatory changes are noted. Patient is status post appendectomy.

Vascular/Lymphatic: Aortic atherosclerosis. No enlarged abdominal or
pelvic lymph nodes.

Reproductive: Prostatic calcifications are noted.

Other: Small fat containing ventral hernia is noted in epigastric
region which is unchanged compared to prior exam.

Musculoskeletal: Status post left hip arthroplasty.
IMPRESSION: Stable cholelithiasis.

Bilateral nonobstructive nephrolithiasis. Stable large right renal
cyst. Distal ureteral calculi cannot be excluded due to scatter
artifact arising from left hip prosthesis. Bladder calculi are
noted. No hydronephrosis is noted.

Aortic atherosclerosis.

Small fat containing ventral hernia seen in epigastric region.

## 2019-05-11 ENCOUNTER — Other Ambulatory Visit: Payer: Self-pay

## 2019-05-11 ENCOUNTER — Encounter: Payer: Self-pay | Admitting: Internal Medicine

## 2019-05-11 ENCOUNTER — Ambulatory Visit (INDEPENDENT_AMBULATORY_CARE_PROVIDER_SITE_OTHER): Payer: PPO | Admitting: Internal Medicine

## 2019-05-11 VITALS — BP 132/76 | HR 84 | Ht 72.0 in | Wt 203.6 lb

## 2019-05-11 DIAGNOSIS — R0789 Other chest pain: Secondary | ICD-10-CM

## 2019-05-11 DIAGNOSIS — R0602 Shortness of breath: Secondary | ICD-10-CM

## 2019-05-11 NOTE — Patient Instructions (Signed)
Medication Instructions:  No changes *If you need a refill on your cardiac medications before your next appointment, please call your pharmacy*  Lab Work: Today: bmet, cbc, tsh, bnp, lipids  If you have labs (blood work) drawn today and your tests are completely normal, you will receive your results only by: Marland Kitchen MyChart Message (if you have MyChart) OR . A paper copy in the mail If you have any lab test that is abnormal or we need to change your treatment, we will call you to review the results.  Testing/Procedures: None ordered  Follow-Up: Based on test results Other Instructions

## 2019-05-11 NOTE — Progress Notes (Signed)
Cardiology Office Note   Date:  05/11/2019   ID:  [name], DOB 04/09/1941, MRN [id number]  PCP:  [facility]  Cardiologist:   Dorris Carnes, MD   F/U of CAD    History of Present Illness: CAD 15 [name] is a 78 y.o. male with a history of CAD s/p PTCA x 2 then  CABG in 2014 (LIMA to OM; RIMA to RCA)  Pt also with a history of kidney stones   I saw the pt briefly in clinic earlier this year  Pt presents today and he complains of fatigue    Difficult several months.  Has kidney stone surgery x 2   Continues to have  hematuria    TIRED   Doesn't want to do anything      Just got back from [place].  There  for [employer]  Last time he felt he  had energy  May or June   Now feels awful    Current Meds  Medication Sig  . Ascorbic Acid (VITAMIN C) 1000 MG tablet Take 1,000 mg by mouth at bedtime.  Marland Kitchen aspirin EC 81 MG tablet Take 162 mg by mouth daily.   . Aspirin-Acetaminophen-Caffeine (GOODY HEADACHE PO) Take 1-2 packets by mouth 2 (two) times daily as needed (pain/headaches).   Marland Kitchen atorvastatin (LIPITOR) 40 MG tablet Take 1 tablet (40 mg total) by mouth daily. (Patient taking differently: Take 20 mg by mouth every evening. )  . b complex vitamins tablet Take 1 tablet by mouth 2 (two) times daily.  . cholecalciferol (VITAMIN D) 1000 UNITS tablet Take 1,000 Units by mouth 2 (two) times daily.   . clopidogrel (PLAVIX) 75 MG tablet Take 75 mg by mouth daily.  . Coenzyme Q10 (CO Q 10) 100 MG CAPS Take 100 mg by mouth daily.   . cyanocobalamin 500 MCG tablet Take 500 mcg by mouth 2 (two) times daily. Vitamin B12  . dicyclomine (BENTYL) 20 MG tablet Take 20 mg by mouth 3 (three) times daily before meals.  . Flaxseed, Linseed, (FLAX SEED OIL PO) Take 1 capsule by mouth daily.   . folic acid (FOLVITE) 1 MG tablet Take 1 mg by mouth 2 (two) times daily.  Marland Kitchen HYDROcodone-acetaminophen (NORCO/VICODIN) 5-325 MG tablet Take 1 tablet by mouth every 6 (six) hours as  needed for moderate pain or severe pain.  . hydroxypropyl methylcellulose / hypromellose (ISOPTO TEARS / GONIOVISC) 2.5 % ophthalmic solution Place 1 drop into both eyes 3 (three) times daily as needed for dry eyes.  . Lactobacillus Acidophilus POWD by Does not apply route.  Marland Kitchen MAGNESIUM PO Take 1 tablet by mouth daily.   . Menthol, Topical Analgesic, (ICY HOT POWER) 16 % GEL Apply 1 application topically daily as needed (pain).  . Misc Natural Products (OSTEO BI-FLEX JOINT SHIELD PO) Take 1 tablet by mouth 2 (two) times daily.   . Multiple Vitamins-Minerals (CENTRUM SILVER 50+MEN) TABS Take 1 tablet by mouth 2 (two) times daily.  . naproxen sodium (ALEVE) 220 MG tablet Take 220 mg by mouth 2 (two) times daily as needed (for back pain).   . Nerve Stimulator (TENS THERAPY PAIN RELIEF) DEVI 1 application by Does not apply route daily as needed.  . nitroGLYCERIN (NITROSTAT) 0.4 MG SL tablet Place 1 tablet (0.4 mg total) under the tongue every 5 (five) minutes as needed for chest pain. As directed  . Omega-3 Fatty Acids (FISH OIL) 500 MG CAPS Take 500 mg by  mouth 2 (two) times daily.  . pantoprazole (PROTONIX) 40 MG tablet Take 1 tablet (40 mg) by mouth daily. Needs office visit for more refills. (Patient taking differently: Take 40 mg by mouth 2 (two) times daily. )  . sucralfate (CARAFATE) 1 g tablet TAKE 1 TABLET BY MOUTH FOUR TIMES DAILY(WITH MEALS AND AT BEDTIME)  . tamsulosin (FLOMAX) 0.4 MG CAPS capsule Take 1 capsule (0.4 mg total) by mouth daily.  . TURMERIC PO Take 1 capsule by mouth daily.     Allergies:   Oxybutynin   Past Medical History:  Diagnosis Date  . Anginal pain (HCC)    pressure- last time 11/23/12 saw Dr Harrington Challenger  . CAD, NATIVE VESSEL 07/22/2008    a.  s/p DES to LAD in 2009; s/p repeat DES to LAD 11/2008 (prox to previous stent).  . Constipation   . DIZZINESS, CHRONIC 02/27/2009  . DJD (degenerative joint disease)   . Edema 12/05/2008  . GERD (gastroesophageal reflux disease)    . History of kidney stones   . HYPERLIPIDEMIA-MIXED 07/22/2008  . HYPERTENSION, BENIGN 07/22/2008  . Lung nodule    RLL lung nodule (PET CT 1/10 negative);R parotid hypermetabolic on PET 06/3084  . Shortness of breath    with exertion  . Stroke [facility]) [phone]   TIA  , Left hand stinging , hurts to flex  . Testicular cancer [facility])     Past Surgical History:  Procedure Laterality Date  . APPENDECTOMY    . Bilateral orchiectomy    . CORONARY ARTERY BYPASS GRAFT N/A 12/01/2012   Procedure: CORONARY ARTERY BYPASS GRAFTING (CABG);  Surgeon: Gaye Pollack, MD;  LIMA to OM and RIMA to RCA  . KIDNEY STONE SURGERY  10/19/2016  . ROTATOR CUFF REPAIR     right  . TONSILLECTOMY    . TOTAL HIP ARTHROPLASTY Left   . Veins stripped       Social History:  The patient  reports that he has never smoked. He has never used smokeless tobacco. He reports current alcohol use. He reports that he does not use drugs.   Family History:  The patient's family history includes Aneurysm in his brother; COPD in his father; Diabetes in his mother; Heart disease in his mother; Hypertension in his mother; Kidney disease in his brother.    ROS:  Please see the history of present illness. All other systems are reviewed and  Negative to the above problem except as noted.    PHYSICAL EXAM: VS:  BP 132/76   Pulse 84   Ht 6' (1.829 m)   Wt 203 lb 9.6 oz (92.4 kg)   BMI 27.61 kg/m   GEN: Overweight 78 yo , in no acute distress  HEENT: normal  Neck: no JVD, carotid bruits, or masses Cardiac: RRR; no murmurs, rubs, or gallops,no edema  Respiratory:  clear to auscultation bilaterally, normal work of breathing GI: soft, nontender, nondistended, + BS  No hepatomegaly  MS: no deformity Moving all extremities   Skin: warm and dry, no rash Neuro:  Strength and sensation are intact Psych: euthymic mood, full affect   EKG:  EKG is not ordered today.   Lipid Panel    Component Value Date/Time   CHOL 127 04/16/2014  1546   TRIG 186.0 (H) 04/16/2014 1546   HDL 33.30 (L) 04/16/2014 1546   CHOLHDL 4 04/16/2014 1546   VLDL 37.2 04/16/2014 1546   LDLCALC 57 04/16/2014 1546      Wt Readings from Last  3 Encounters:  05/11/19 203 lb 9.6 oz (92.4 kg)  01/17/19 213 lb (96.6 kg)  09/25/18 213 lb (96.6 kg)      ASSESSMENT AND PLAN: 1  Fatigue/dysnea the patient's biggest complaint is of fatigue, dyspnea with activity.  He says since much less than 6 months ago 6 months ago he is walking 3 miles per day he does not think he can do that.  This summer he did go on a mission trip to [place] for the [employer] but denies smoke exposure.  I would set the patient up for labs today (CBC, B met, lipid, TSH.  Fluid status looks okay.  If the above tests are negative I would set him up for a Lexiscan Myoview to rule out inducible ischemia 2  CAD  Pt is 6 years post intervention   Review labs    If normal would set up for a lexiscan myovue   3  HL  Check lipods today    Tentative f/u in 6 weeks   Current medicines are reviewed at length with the patient today.  The patient does not have concerns regarding medicines.  Signed, Dorris Carnes, MD  05/11/2019 2:16 PM    [facility] [place], [place], [place]  [address] Phone: [phone]; Fax: [phone]

## 2019-05-12 LAB — PRO B NATRIURETIC PEPTIDE: NT-Pro BNP: 503 pg/mL — ABNORMAL HIGH (ref 0–486)

## 2019-05-12 LAB — CBC
Hematocrit: 35.9 % — ABNORMAL LOW (ref 37.5–51.0)
Hemoglobin: 12.3 g/dL — ABNORMAL LOW (ref 13.0–17.7)
MCH: 30.8 pg (ref 26.6–33.0)
MCHC: 34.3 g/dL (ref 31.5–35.7)
MCV: 90 fL (ref 79–97)
Platelets: 234 10*3/uL (ref 150–450)
RBC: 4 x10E6/uL — ABNORMAL LOW (ref 4.14–5.80)
RDW: 12.9 % (ref 11.6–15.4)
WBC: 4.9 10*3/uL (ref 3.4–10.8)

## 2019-05-12 LAB — BASIC METABOLIC PANEL
BUN/Creatinine Ratio: 28 — ABNORMAL HIGH (ref 10–24)
BUN: 22 mg/dL (ref 8–27)
CO2: 24 mmol/L (ref 20–29)
Calcium: 9.6 mg/dL (ref 8.6–10.2)
Chloride: 106 mmol/L (ref 96–106)
Creatinine, Ser: 0.78 mg/dL (ref 0.76–1.27)
GFR calc Af Amer: 99 mL/min/{1.73_m2} (ref >59)
GFR calc non Af Amer: 86 mL/min/{1.73_m2} (ref >59)
Glucose: 101 mg/dL — ABNORMAL HIGH (ref 65–99)
Potassium: 4.4 mmol/L (ref 3.5–5.2)
Sodium: 142 mmol/L (ref 134–144)

## 2019-05-12 LAB — LIPID PANEL
Chol/HDL Ratio: 2.6 ratio (ref 0.0–5.0)
Cholesterol, Total: 131 mg/dL (ref 100–199)
HDL: 50 mg/dL (ref >39)
LDL Chol Calc (NIH): 62 mg/dL (ref 0–99)
Triglycerides: 105 mg/dL (ref 0–149)
VLDL Cholesterol Cal: 19 mg/dL (ref 5–40)

## 2019-05-12 LAB — TSH: TSH: 1.04 u[IU]/mL (ref 0.450–4.500)

## 2019-05-15 ENCOUNTER — Telehealth: Payer: Self-pay | Admitting: *Deleted

## 2019-05-15 DIAGNOSIS — R0789 Other chest pain: Secondary | ICD-10-CM

## 2019-05-15 DIAGNOSIS — R0602 Shortness of breath: Secondary | ICD-10-CM

## 2019-05-15 NOTE — Telephone Encounter (Signed)
-----   Message from Dorris Carnes V, MD sent at 05/13/2019  5:48 PM EST ----- Electrolytes and kidney function are OK Lipids are very good   Thyroid function is normal   Hgb is minimally decreased  Fluid is up minimally   Watch salt Set up for lexiscan myovue

## 2019-05-15 NOTE — Telephone Encounter (Signed)
Pt has been informed of results/recommendations and verbalizes understanding.  He requests results sent to [facility].  Was seen in [facility] in [place] yesterday.  Faxed to them at [phone].  Aware he will be called to schedule myovue.  Instructions to prepare for myovue provided over phone.

## 2019-05-16 ENCOUNTER — Telehealth (HOSPITAL_COMMUNITY): Payer: Self-pay

## 2019-05-16 NOTE — Telephone Encounter (Signed)
Spoke with the patient, instructions given. He stated that he would be here for his test. Asked to call back with any questions. S.Marise Knapper EMTP 

## 2019-05-22 ENCOUNTER — Encounter (HOSPITAL_COMMUNITY): Payer: No Typology Code available for payment source

## 2019-05-23 ENCOUNTER — Telehealth (HOSPITAL_COMMUNITY): Payer: Self-pay | Admitting: *Deleted

## 2019-05-23 NOTE — Telephone Encounter (Signed)
Patient given detailed instructions per Myocardial Perfusion Study Information Sheet for the test on 05/25/19 at 10:30. Patient notified to arrive 15 minutes early and that it is imperative to arrive on time for appointment to keep from having the test rescheduled.  If you need to cancel or reschedule your appointment, please call the office within 24 hours of your appointment. . Patient verbalized understanding.[name]

## 2019-05-25 ENCOUNTER — Ambulatory Visit (HOSPITAL_COMMUNITY): Payer: PPO | Attending: Internal Medicine

## 2019-05-25 ENCOUNTER — Other Ambulatory Visit: Payer: Self-pay

## 2019-05-25 DIAGNOSIS — R0789 Other chest pain: Secondary | ICD-10-CM | POA: Diagnosis not present

## 2019-05-25 DIAGNOSIS — R0602 Shortness of breath: Secondary | ICD-10-CM | POA: Diagnosis not present

## 2019-05-25 LAB — MYOCARDIAL PERFUSION IMAGING
LV dias vol: 86 mL (ref 62–150)
LV sys vol: 47 mL
Peak HR: 105 {beats}/min
Rest HR: 73 {beats}/min
SDS: 2
SRS: 3
SSS: 5
TID: 0.88

## 2019-05-25 MED ORDER — REGADENOSON 0.4 MG/5ML IV SOLN
0.4000 mg | Freq: Once | INTRAVENOUS | Status: AC
  Administered 2019-05-25: 0.4 mg via INTRAVENOUS

## 2019-05-25 MED ORDER — TECHNETIUM TC 99M TETROFOSMIN IV KIT
32.9000 | PACK | Freq: Once | INTRAVENOUS | Status: AC | PRN
  Administered 2019-05-25: 32.9 via INTRAVENOUS
  Filled 2019-05-25: qty 33

## 2019-05-25 MED ORDER — TECHNETIUM TC 99M TETROFOSMIN IV KIT
10.4000 | PACK | Freq: Once | INTRAVENOUS | Status: AC | PRN
  Administered 2019-05-25: 10.4 via INTRAVENOUS
  Filled 2019-05-25: qty 11

## 2019-06-04 ENCOUNTER — Other Ambulatory Visit: Payer: Self-pay | Admitting: Internal Medicine

## 2019-09-26 ENCOUNTER — Other Ambulatory Visit: Payer: Self-pay

## 2019-09-26 ENCOUNTER — Telehealth: Payer: Self-pay

## 2019-09-26 ENCOUNTER — Other Ambulatory Visit: Payer: Self-pay | Admitting: *Deleted

## 2019-09-26 ENCOUNTER — Other Ambulatory Visit: Payer: No Typology Code available for payment source | Admitting: *Deleted

## 2019-09-26 DIAGNOSIS — I2510 Atherosclerotic heart disease of native coronary artery without angina pectoris: Secondary | ICD-10-CM

## 2019-09-26 DIAGNOSIS — R079 Chest pain, unspecified: Secondary | ICD-10-CM

## 2019-09-26 LAB — TROPONIN I (HIGH SENSITIVITY): Troponin I (High Sensitivity): 9 ng/L (ref <18)

## 2019-09-26 NOTE — Telephone Encounter (Signed)
Dr Harrington Challenger called and wanted the patient to come in for labs: CBC, BMET, TROPONIN and LIVER.  The patient will come in today 4/7.

## 2019-09-27 ENCOUNTER — Telehealth: Payer: Self-pay | Admitting: Internal Medicine

## 2019-09-27 LAB — HEPATIC FUNCTION PANEL
ALT: 18 IU/L (ref 0–44)
AST: 21 IU/L (ref 0–40)
Albumin: 4.2 g/dL (ref 3.7–4.7)
Alkaline Phosphatase: 66 IU/L (ref 39–117)
Bilirubin Total: 0.3 mg/dL (ref 0.0–1.2)
Bilirubin, Direct: 0.12 mg/dL (ref 0.00–0.40)
Total Protein: 6 g/dL (ref 6.0–8.5)

## 2019-09-27 LAB — BASIC METABOLIC PANEL
BUN/Creatinine Ratio: 27 — ABNORMAL HIGH (ref 10–24)
BUN: 21 mg/dL (ref 8–27)
CO2: 26 mmol/L (ref 20–29)
Calcium: 9.7 mg/dL (ref 8.6–10.2)
Chloride: 106 mmol/L (ref 96–106)
Creatinine, Ser: 0.78 mg/dL (ref 0.76–1.27)
GFR calc Af Amer: 99 mL/min/{1.73_m2} (ref >59)
GFR calc non Af Amer: 86 mL/min/{1.73_m2} (ref >59)
Glucose: 112 mg/dL — ABNORMAL HIGH (ref 65–99)
Potassium: 4.7 mmol/L (ref 3.5–5.2)
Sodium: 143 mmol/L (ref 134–144)

## 2019-09-27 LAB — CBC
Hematocrit: 37.9 % (ref 37.5–51.0)
Hemoglobin: 12.2 g/dL — ABNORMAL LOW (ref 13.0–17.7)
MCH: 28.6 pg (ref 26.6–33.0)
MCHC: 32.2 g/dL (ref 31.5–35.7)
MCV: 89 fL (ref 79–97)
Platelets: 244 10*3/uL (ref 150–450)
RBC: 4.27 x10E6/uL (ref 4.14–5.80)
RDW: 14.5 % (ref 11.6–15.4)
WBC: 5.1 10*3/uL (ref 3.4–10.8)

## 2019-09-27 NOTE — Telephone Encounter (Signed)
Pt called in yesterday    He says that 2 days before that he woke up with severe CP   Lasted about 45 min  Did not take nitro.    After spell he still continued to have spells of pressure  Come and go I recommhe come in for labs  He did   (CBC, CMET, troponin)   All normal  Today he is feeling better  Note normal myovue in Dec 2020  REcomm: Keep NTG with him   Take for CP If he has more symptoms he said he would call sooner, take NTG Will need to make other plans at that time

## 2019-11-12 ENCOUNTER — Telehealth: Payer: Self-pay | Admitting: Gastroenterology

## 2019-11-12 NOTE — Telephone Encounter (Signed)
Awaiting records for review.

## 2019-11-12 NOTE — Telephone Encounter (Signed)
Hi Dr. Tarri Glenn, we have received a referral from the [facility] for a repeat colon. Patient had colon in 2019 at the [place]. Records have been received and they will be sent to you for review. Please advise on scheduling. Thank you.

## 2019-11-16 ENCOUNTER — Encounter: Payer: Self-pay | Admitting: Gastroenterology

## 2019-11-16 NOTE — Telephone Encounter (Signed)
Left message to call back. Dr. Tarri Glenn reviewed records and indicated that pt needs OV first due pt needing egd and colon (2 day prep).

## 2020-01-03 ENCOUNTER — Ambulatory Visit: Payer: No Typology Code available for payment source | Admitting: Gastroenterology

## 2020-01-11 ENCOUNTER — Encounter: Payer: Self-pay | Admitting: Gastroenterology

## 2020-01-11 ENCOUNTER — Telehealth: Payer: Self-pay

## 2020-01-11 ENCOUNTER — Ambulatory Visit (INDEPENDENT_AMBULATORY_CARE_PROVIDER_SITE_OTHER): Payer: No Typology Code available for payment source | Admitting: Gastroenterology

## 2020-01-11 VITALS — BP 118/62 | HR 92 | Ht 72.0 in | Wt 210.0 lb

## 2020-01-11 DIAGNOSIS — K219 Gastro-esophageal reflux disease without esophagitis: Secondary | ICD-10-CM

## 2020-01-11 DIAGNOSIS — K625 Hemorrhage of anus and rectum: Secondary | ICD-10-CM | POA: Diagnosis not present

## 2020-01-11 DIAGNOSIS — R140 Abdominal distension (gaseous): Secondary | ICD-10-CM

## 2020-01-11 DIAGNOSIS — R1310 Dysphagia, unspecified: Secondary | ICD-10-CM

## 2020-01-11 MED ORDER — PEG 3350-KCL-NA BICARB-NACL 420 G PO SOLR: 4000.0000 mL | Freq: Once | ORAL | 0 refills | Status: AC

## 2020-01-11 NOTE — Progress Notes (Signed)
Referring Provider: [facility] Primary Care Physician:  [facility]  Reason for Consultation:  Bloating, abdominal pain, and change in bowel habits   IMPRESSION:  Bloating Solid food dysphagia Early satiety Constipation GERD Normocytic anemia ? Melena Bright red blood per rectum Barrett's Esophagus History of polyps    - SSA 2010    - hyperplastic polyp 2016 Frequent NSAIDs On Plavix  EGD to follow-up on Barrett's as well as evaluate bloating, early satiety, and solid food dysphagia.  Colonoscopy recommended given the change in bowel habits, potential blood in the stool, and history of colon polyps.    Will need to hold Plavix for 5 days before endoscopy.  I discussed with the patient that there is a low, but real, risk of a cardiovascular event such as heart attack, stroke, or embolism/thrombosis while off Plavix. Will communicate by phone or EMR with patient's prescribing provider to confirm that holding the Plavix is appropriate at this time.    PLAN: EGD after Plavix washout out if okayed by Dr. Harrington Challenger Colonoscopy with 2 day prep, after 3 days of Miralax 17 g BID  Please see the "Patient Instructions" section for addition details about the plan.  HPI: [name] is a 79 y.o. male referred by the [place]. The history is obtained through the patient and records provided by the [place]. He has a history of anemia, testicular cancer, chronic kidney disease, cardiomyopathy, coronary artery disease, pre-diabetes, hypercholesterolemia, TIA, and kidney stones. Records show a diagnosis of Barrett's esophagus with surveillance in 2008, 2010, and 2016.  He is on long-term Plavix use.   Seen in the [place] GI office visit in 2019 for dry mouth, solid food dysphagia, early satiety Longstanding history of constipation over the last year, sometimes for up to 2 weeks between bowel movements. Feels bloating with severe constipation. Then he will have multiple BM in one day.  Frequently black. Sense of incomplete evacuation.  Initially some some bright red blood.   Adenoma removed on colonoscopy in 2010 with an SSA. Hyperplastic polyp in 2016. Poor prep during colonoscopy at the [place] in 2019. No endoscopy prior to or since that time. Random colon biopsies were normal.  CBC 11/02/19 with hemoglobin of 12.3, MCV 90.7 Baseline BM are daily to every other day Weight is stable. Appetite is good.   Uses Aleve for back pain weekly and Goodys for headaches .  Given ducosate and Miralax, MOM, prune juice.   Limited family history. No known family history of colon cancer or polyps. No family history of uterine/endometrial cancer, pancreatic cancer or gastric/stomach cancer.   Past Medical History:  Diagnosis Date  . Anginal pain (HCC)    pressure- last time 11/23/12 saw Dr Harrington Challenger  . CAD, NATIVE VESSEL 07/22/2008    a.  s/p DES to LAD in 2009; s/p repeat DES to LAD 11/2008 (prox to previous stent).  . Constipation   . DIZZINESS, CHRONIC 02/27/2009  . DJD (degenerative joint disease)   . Edema 12/05/2008  . GERD (gastroesophageal reflux disease)   . History of kidney stones   . HYPERLIPIDEMIA-MIXED 07/22/2008  . HYPERTENSION, BENIGN 07/22/2008  . Lung nodule    RLL lung nodule (PET CT 1/10 negative);R parotid hypermetabolic on PET 06/6107  . Shortness of breath    with exertion  . Stroke [facility]) [phone]   TIA  , Left hand stinging , hurts to flex  . Testicular cancer [facility])     Past Surgical History:  Procedure Laterality  Date  . APPENDECTOMY    . Bilateral orchiectomy    . CORONARY ARTERY BYPASS GRAFT N/A 12/01/2012   Procedure: CORONARY ARTERY BYPASS GRAFTING (CABG);  Surgeon: Gaye Pollack, MD;  LIMA to OM and RIMA to RCA  . KIDNEY STONE SURGERY  10/19/2016  . ROTATOR CUFF REPAIR     right  . TONSILLECTOMY    . TOTAL HIP ARTHROPLASTY Left   . Veins stripped      Current Outpatient Medications  Medication Sig Dispense Refill  . Ascorbic Acid (VITAMIN C) 1000 MG  tablet Take 1,000 mg by mouth at bedtime.    Marland Kitchen aspirin EC 81 MG tablet Take 162 mg by mouth daily.     . Aspirin-Acetaminophen-Caffeine (GOODY HEADACHE PO) Take 1-2 packets by mouth 2 (two) times daily as needed (pain/headaches).     Marland Kitchen atorvastatin (LIPITOR) 40 MG tablet Take 1 tablet (40 mg total) by mouth daily. (Patient taking differently: Take 40 mg by mouth every evening. ) 30 tablet 4  . b complex vitamins tablet Take 1 tablet by mouth 2 (two) times daily.    . cholecalciferol (VITAMIN D) 1000 UNITS tablet Take 1,000 Units by mouth 2 (two) times daily.     . clopidogrel (PLAVIX) 75 MG tablet Take 75 mg by mouth daily.    . Coenzyme Q10 (CO Q 10) 100 MG CAPS Take 100 mg by mouth daily.     . cyanocobalamin 500 MCG tablet Take 500 mcg by mouth 2 (two) times daily. Vitamin B12    . dicyclomine (BENTYL) 20 MG tablet Take 20 mg by mouth 3 (three) times daily before meals.    . Flaxseed, Linseed, (FLAX SEED OIL PO) Take 1 capsule by mouth daily.     . folic acid (FOLVITE) 1 MG tablet Take 1 mg by mouth 2 (two) times daily.    Marland Kitchen HYDROcodone-acetaminophen (NORCO/VICODIN) 5-325 MG tablet Take 1 tablet by mouth every 6 (six) hours as needed for moderate pain or severe pain. 10 tablet 0  . Menthol, Topical Analgesic, (ICY HOT POWER) 16 % GEL Apply 1 application topically daily as needed (pain).    . Misc Natural Products (OSTEO BI-FLEX JOINT SHIELD PO) Take 1 tablet by mouth 2 (two) times daily.     . Multiple Vitamins-Minerals (CENTRUM SILVER 50+MEN) TABS Take 1 tablet by mouth 2 (two) times daily.    . naproxen sodium (ALEVE) 220 MG tablet Take 220 mg by mouth 2 (two) times daily as needed (for back pain).     . Nerve Stimulator (TENS THERAPY PAIN RELIEF) DEVI 1 application by Does not apply route daily as needed.    . nitroGLYCERIN (NITROSTAT) 0.4 MG SL tablet Place 1 tablet (0.4 mg total) under the tongue every 5 (five) minutes as needed for chest pain. As directed 25 tablet 3  . Omega-3 Fatty  Acids (FISH OIL) 500 MG CAPS Take 500 mg by mouth 2 (two) times daily.    . pantoprazole (PROTONIX) 40 MG tablet Take 1 tablet (40 mg) by mouth daily. Needs office visit for more refills. (Patient taking differently: Take 40 mg by mouth 2 (two) times daily. ) 30 tablet 0  . polyethylene glycol (MIRALAX / GLYCOLAX) 17 g packet Take 17 g by mouth daily as needed.    . senna (SENOKOT) 8.6 MG tablet Take 1 tablet by mouth 2 (two) times daily as needed for constipation.    . sucralfate (CARAFATE) 1 g tablet TAKE 1 TABLET BY MOUTH FOUR  TIMES DAILY(WITH MEALS AND AT BEDTIME) 120 tablet 6  . tamsulosin (FLOMAX) 0.4 MG CAPS capsule Take 1 capsule (0.4 mg total) by mouth daily. 7 capsule 0  . TURMERIC PO Take 1 capsule by mouth daily.     No current facility-administered medications for this visit.    Allergies as of 01/11/2020 - Review Complete 05/25/2019  Allergen Reaction Noted  . Oxybutynin Itching and Other (See Comments) 06/07/2017    Family History  Problem Relation Age of Onset  . Aneurysm Brother   . Kidney disease Brother   . Heart disease Mother   . Hypertension Mother   . Diabetes Mother   . COPD Father   . Heart attack Neg Hx   . Stroke Neg Hx     Social History   Socioeconomic History  . Marital status: Widowed    Spouse name: Not on file  . Number of children: 2  . Years of education: 53  . Highest education level: Not on file  Occupational History  . Occupation: Forensic scientist   . Occupation: Security guard  Tobacco Use  . Smoking status: Never Smoker  . Smokeless tobacco: Never Used  Substance and Sexual Activity  . Alcohol use: Yes    Comment: rare  . Drug use: No  . Sexual activity: Not Currently  Other Topics Concern  . Not on file  Social History Narrative   Fun: Watch baseball, disaster mission through [employer] and travel.   Denies abuse and feels safe at home.    Social Determinants of Health   Financial Resource Strain:   . Difficulty of Paying  Living Expenses:   Food Insecurity:   . Worried About Charity fundraiser in the Last Year:   . Arboriculturist in the Last Year:   Transportation Needs:   . Film/video editor (Medical):   Marland Kitchen Lack of Transportation (Non-Medical):   Physical Activity:   . Days of Exercise per Week:   . Minutes of Exercise per Session:   Stress:   . Feeling of Stress :   Social Connections:   . Frequency of Communication with Friends and Family:   . Frequency of Social Gatherings with Friends and Family:   . Attends Religious Services:   . Active Member of Clubs or Organizations:   . Attends Archivist Meetings:   Marland Kitchen Marital Status:   Intimate Partner Violence:   . Fear of Current or Ex-Partner:   . Emotionally Abused:   Marland Kitchen Physically Abused:   . Sexually Abused:     Review of Systems: 12 system ROS is negative except as noted above with the addition of arthritis, back pain, blood in the urine, cough, fatigue, headaches, hearing problems, itching muscle cramps, shortness of breath, and urine leakage.   Physical Exam: General:   Alert,  well-nourished, pleasant and cooperative in NAD Head:  Normocephalic and atraumatic. Eyes:  Sclera clear, no icterus.   Conjunctiva pink. Ears:  Normal auditory acuity. Nose:  No deformity, discharge,  or lesions. Mouth:  No deformity or lesions.   Neck:  Supple; no masses or thyromegaly. Lungs:  Clear throughout to auscultation.   No wheezes. Heart:  Regular rate and rhythm; no murmurs. Abdomen:  Soft, central obesity, nontender, nondistended, normal bowel sounds, no rebound or guarding. No hepatosplenomegaly.   Rectal:  Deferred  Msk:  Symmetrical. No boney deformities LAD: No inguinal or umbilical LAD Extremities:  No clubbing or edema. Neurologic:  Alert and  oriented x4;  grossly nonfocal Skin:  Intact without significant lesions or rashes. Psych:  Alert and cooperative. Normal mood and affect.     Randall Hauss L. Tarri Glenn, MD,  [facility] 01/11/2020, 2:11 PM

## 2020-01-11 NOTE — Patient Instructions (Addendum)
You have been scheduled for an endoscopy and colonoscopy. Please follow the written instructions given to you at your visit today. Please pick up your prep supplies at the pharmacy within the next 1-3 days. If you use inhalers (even only as needed), please bring them with you on the day of your procedure.   START MIRALAX -Dissolve 17 grams in at least 8 ounces of water -TWICE DAILY FOR 3 days, before colonoscopy.   You will be contaced by our office prior to your procedure for directions on holding your Plavix.  If you do not hear from our office 1 week prior to your scheduled procedure, please call [phone] to discuss.   If you are age 93 or older, your body mass index should be between 23-30. Your Body mass index is 28.48 kg/m. If this is out of the aforementioned range listed, please consider follow up with your Primary Care Provider.  If you are age 94 or younger, your body mass index should be between 19-25. Your Body mass index is 28.48 kg/m. If this is out of the aformentioned range listed, please consider follow up with your Primary Care Provider.    RX for Nuly[place] has been given to you to take to the [place].   Thank you for choosing me and [facility].  Dr. Thornton Park

## 2020-01-11 NOTE — Telephone Encounter (Signed)
Primary Cardiologist:Paula Harrington Challenger, MD  Chart reviewed as part of pre-operative protocol coverage. Because of [name]'s past medical history and time since last visit, he/she will require a follow-up visit in order to better assess preoperative cardiovascular risk.  Pre-op covering staff: - Please schedule appointment and call patient to inform them. - Please contact requesting surgeon's office via preferred method (i.e, phone, fax) to inform them of need for appointment prior to surgery.  If applicable, this message will also be routed to pharmacy pool and/or primary cardiologist for input on holding anticoagulant/antiplatelet agent as requested below so that this information is available at time of patient's appointment.   Deberah Pelton, NP  01/11/2020, 3:28 PM    Randall Ng. Tytianna Greenley NP-C    01/11/2020, 3:28 PM [facility] [place] Office [phone] Fax [phone]

## 2020-01-11 NOTE — Telephone Encounter (Signed)
Request for surgical clearance:     Endoscopy Procedure  What type of surgery is being performed?     Endo/Colon   When is this surgery scheduled?     03/06/20  What type of clearance is required ?   Pharmacy  Are there any medications that need to be held prior to surgery and how long? Plavix x5 days before procedure   Practice name and name of physician performing surgery?      [facility]-Dr. Tarri Glenn   What is your office phone and fax number?      Phone- [phone]  [phone]  Anesthesia type (None, local, MAC, general) ?       MAC

## 2020-01-11 NOTE — Telephone Encounter (Signed)
Called pt and scheduled an appt for cardiac clearance on 02/05/2020 @ 8:45 AM with Robbie Lis, PA

## 2020-01-11 NOTE — Telephone Encounter (Signed)
Forwarded to requesting party via Epic fax function 

## 2020-01-22 DIAGNOSIS — Z0000 Encounter for general adult medical examination without abnormal findings: Secondary | ICD-10-CM | POA: Diagnosis not present

## 2020-02-04 NOTE — Progress Notes (Signed)
Cardiology Office Note    Date:  02/05/2020   ID:  ICHOLAS [name], DOB 29-Nov-1940, MRN [id number]  PCP:  [facility]  Cardiologist:  Dr. Harrington Challenger  Chief Complaint: surgical clearance for colonoscopy  History of Present Illness:   [name] is a 79 y.o. male with hx of CAD and HLD presents for surgical clearance.   Hx of CAD s/p DES to LAD in 2009 & DES to LAD 11/2008 (proximal to previous stent) and eventually CABG x (LIMA to OM and RIMA to RCA) in 2014.   Echo 06/2018: Normal LVEF and grade 1 DD  Complained of fatigue and dyspnea when last seen by Dr. Harrington Challenger 04/2019. Follow up stress test showed no ischemia.   Patient is here for surgical clearance.  His heart rate remained stable.  He has chronic dyspnea with climbing steps.  No chest pain or pressure.  He worked 70-hour last week at [employer] for [employer].  He walks 3 miles once every week without any limitation.  He has chronic mild lower extremity edema.  He is not on any antihypertensive regimen.  Reports sleeping on 2 pillows chronically.  No orthopnea or PND.  Denies palpitation.  Compliant with his medication.  Reports eating low-sodium diet.   Past Medical History:  Diagnosis Date  . Anginal pain (HCC)    pressure- last time 11/23/12 saw Dr Harrington Challenger  . CAD, NATIVE VESSEL 07/22/2008    a.  s/p DES to LAD in 2009; s/p repeat DES to LAD 11/2008 (prox to previous stent).  . Constipation   . DIZZINESS, CHRONIC 02/27/2009  . DJD (degenerative joint disease)   . Edema 12/05/2008  . GERD (gastroesophageal reflux disease)   . History of kidney stones   . HYPERLIPIDEMIA-MIXED 07/22/2008  . HYPERTENSION, BENIGN 07/22/2008  . Lung nodule    RLL lung nodule (PET CT 1/10 negative);R parotid hypermetabolic on PET 06/6008  . Shortness of breath    with exertion  . Stroke [facility]) [phone]   TIA  , Left hand stinging , hurts to flex  . Testicular cancer [facility])     Past Surgical History:  Procedure Laterality Date   . APPENDECTOMY    . Bilateral orchiectomy    . CORONARY ARTERY BYPASS GRAFT N/A 12/01/2012   Procedure: CORONARY ARTERY BYPASS GRAFTING (CABG);  Surgeon: Gaye Pollack, MD;  LIMA to OM and RIMA to RCA  . KIDNEY STONE SURGERY  10/19/2016  . ROTATOR CUFF REPAIR     right  . TONSILLECTOMY    . TOTAL HIP ARTHROPLASTY Left   . Veins stripped      Current Medications: Prior to Admission medications   Medication Sig Start Date End Date Taking? Authorizing Provider  Ascorbic Acid (VITAMIN C) 1000 MG tablet Take 1,000 mg by mouth at bedtime.    [provider]  aspirin EC 81 MG tablet Take 162 mg by mouth daily.     [provider]  Aspirin-Acetaminophen-Caffeine (GOODY HEADACHE PO) Take 1-2 packets by mouth 2 (two) times daily as needed (pain/headaches).     [provider]  atorvastatin (LIPITOR) 40 MG tablet Take 1 tablet (40 mg total) by mouth daily. Patient taking differently: Take 40 mg by mouth every evening.  01/22/14   Fay Records, MD  b complex vitamins tablet Take 1 tablet by mouth 2 (two) times daily.    [provider]  cholecalciferol (VITAMIN D) 1000 UNITS tablet Take 1,000 Units by mouth  2 (two) times daily.     [provider]  clopidogrel (PLAVIX) 75 MG tablet Take 75 mg by mouth daily.    [provider]  Coenzyme Q10 (CO Q 10) 100 MG CAPS Take 100 mg by mouth daily.     [provider]  esomeprazole (NEXIUM) 40 MG capsule Take 40 mg by mouth 2 (two) times daily before a meal.    [provider]  Flaxseed, Linseed, (FLAX SEED OIL PO) Take 1 capsule by mouth daily.     [provider]  folic acid (FOLVITE) 1 MG tablet Take 1 mg by mouth 2 (two) times daily.    [provider]  gabapentin (NEURONTIN) 100 MG capsule Take 100 mg by mouth at bedtime.    [provider]  Menthol, Topical Analgesic, (ICY HOT POWER) 16 % GEL Apply 1 application topically daily as needed (pain).     [provider]  Misc Natural Products (OSTEO BI-FLEX JOINT SHIELD PO) Take 1 tablet by mouth 2 (two) times daily.     [provider]  Multiple Vitamins-Minerals (CENTRUM SILVER 50+MEN) TABS Take 1 tablet by mouth 2 (two) times daily.    [provider]  Nerve Stimulator (TENS THERAPY PAIN RELIEF) DEVI 1 application by Does not apply route daily as needed.    [provider]  nitroGLYCERIN (NITROSTAT) 0.4 MG SL tablet Place 1 tablet (0.4 mg total) under the tongue every 5 (five) minutes as needed for chest pain. As directed 12/13/17   Fay Records, MD  Omega-3 Fatty Acids (FISH OIL) 500 MG CAPS Take 500 mg by mouth 2 (two) times daily.    [provider]  polyethylene glycol (MIRALAX / GLYCOLAX) 17 g packet Take 17 g by mouth daily as needed.    [provider]  PSYLLIUM PO Take by mouth daily.    [provider]  sennosides-docusate sodium (SENOKOT-S) 8.6-50 MG tablet Take 1 tablet by mouth as needed for constipation.    [provider]  TURMERIC PO Take 1 capsule by mouth daily.    [provider]    Allergies:   Oxybutynin   Social History   Socioeconomic History  . Marital status: Widowed    Spouse name: Not on file  . Number of children: 2  . Years of education: 83  . Highest education level: Not on file  Occupational History  . Occupation: Forensic scientist   . Occupation: Security guard  Tobacco Use  . Smoking status: Never Smoker  . Smokeless tobacco: Never Used  Vaping Use  . Vaping Use: Never used  Substance and Sexual Activity  . Alcohol use: Yes    Comment: rare  . Drug use: No  . Sexual activity: Not Currently  Other Topics Concern  . Not on file  Social History Narrative   Fun: Watch baseball, disaster mission through [employer] and travel.   Denies abuse and feels safe at home.    Social Determinants of Health   Financial Resource Strain:   . Difficulty of Paying Living Expenses:    Food Insecurity:   . Worried About Charity fundraiser in the Last Year:   . Arboriculturist in the Last Year:   Transportation Needs:   . Film/video editor (Medical):   Marland Kitchen Lack of Transportation (Non-Medical):   Physical Activity:   . Days of Exercise per Week:   . Minutes of Exercise per Session:   Stress:   .  Feeling of Stress :   Social Connections:   . Frequency of Communication with Friends and Family:   . Frequency of Social Gatherings with Friends and Family:   . Attends Religious Services:   . Active Member of Clubs or Organizations:   . Attends Archivist Meetings:   Marland Kitchen Marital Status:      Family History:  The patient's family history includes Aneurysm in his brother; COPD in his father; Diabetes in his mother; Heart disease in his mother; Hypertension in his mother; Kidney disease in his brother.   ROS:   Please see the history of present illness.    ROS All other systems reviewed and are negative.   PHYSICAL EXAM:   VS:  BP 140/82   Pulse 73   Ht 6' (1.829 m)   Wt 220 lb (99.8 kg)   SpO2 96%   BMI 29.84 kg/m    GEN: Well nourished, well developed, in no acute distress  HEENT: normal  Neck: no JVD, carotid bruits, or masses Cardiac: RRR; no murmurs, rubs, or gallops,no edema  Respiratory:  clear to auscultation bilaterally, normal work of breathing GI: soft, nontender, nondistended, + BS MS: no deformity or atrophy  Skin: warm and dry, no rash Neuro:  Alert and Oriented x 3, Strength and sensation are intact Psych: euthymic mood, full affect  Wt Readings from Last 3 Encounters:  02/05/20 220 lb (99.8 kg)  01/11/20 (!) 210 lb (95.3 kg)  05/25/19 203 lb (92.1 kg)      Studies/Labs Reviewed:   EKG:  EKG is ordered today.  The ekg ordered today demonstrates normal sinus rhythm with PAC  Recent Labs: 05/11/2019: NT-Pro BNP 503; TSH 1.040 09/26/2019: ALT 18; BUN 21; Creatinine, Ser 0.78; Hemoglobin 12.2; Platelets 244; Potassium 4.7;  Sodium 143   Lipid Panel    Component Value Date/Time   CHOL 131 05/11/2019 1437   TRIG 105 05/11/2019 1437   HDL 50 05/11/2019 1437   CHOLHDL 2.6 05/11/2019 1437   CHOLHDL 4 04/16/2014 1546   VLDL 37.2 04/16/2014 1546   LDLCALC 62 05/11/2019 1437    Additional studies/ records that were reviewed today include:    Exercise Myoview 05/2019:  EKG without significant changes to suggest ischemia  Probable normal perfusion and mild soft tissue attenuation. No significant ischemia or scar  LVEF calculated at 45% Visual estimate suggests LVEF is normal.     ASSESSMENT & PLAN:    1. CAD Denies chest pain or pressure.  Continue dual antiplatelet therapy with aspirin and Plavix.  Continue statin.   2.  Chronic fatigue and dyspnea on exertion -This is chronic and stable.  Exercise Myoview without evidence of ischemia.  Patient has lower extremity edema for past 79-month or so.  He denies orthopnea or PND.  Advised to elevate legs and cut back on salt further.  Keep an eye on his blood pressure.  His EF was normal on echocardiogram January 2020.  EF calculated at 45% during stress test December 2020.  He may need repeat echocardiogram or ischemic evaluation (preferred cardiac catheterization) in future if symptoms persistent.  [facility]   3.  Elevated blood pressure reading -Blood pressure of 140/82 today.  It was normal on prior visit.  Hypertension listed on problem list however never been on any antihypertensive regimen.  He will keep track of his blood pressure at home.  4.  Hyperlipidemia -Continue statin -Followed at [facility]  5.  Surgical  clearance -Patient is easily getting greater than 4 METS of activity.Given past medical history and time since last visit, based on ACC/AHA guidelines, HITOSHI WERTS would be at acceptable risk for the planned procedure without further cardiovascular testing. He can hold Plavix 5 days prior to procedure.   I  will route this recommendation to the requesting party via Epic fax function and remove from pre-op pool.  Please call with questions.  [place], [place] 02/05/2020, 10:01 AM   Medication Adjustments/Labs and Tests Ordered: Current medicines are reviewed at length with the patient today.  Concerns regarding medicines are outlined above.  Medication changes, Labs and Tests ordered today are listed in the Patient Instructions below. Patient Instructions  Medication Instructions:  Your physician recommends that you continue on your current medications as directed. Please refer to the Current Medication list given to you today.  *If you need a refill on your cardiac medications before your next appointment, please call your pharmacy*   Lab Work: None or If you have labs (blood work) drawn today and your tests are completely normal, you will receive your results only by: Marland Kitchen MyChart Message (if you have MyChart) OR . A paper copy in the mail If you have any lab test that is abnormal or we need to change your treatment, we will call you to review the results.   Testing/Procedures: None ordered   Follow-Up: At [facility], you and your health needs are our priority.  As part of our continuing mission to provide you with exceptional heart care, we have created designated Provider Care Teams.  These Care Teams include your primary Cardiologist (physician) and Advanced Practice Providers (APPs -  Physician Assistants and Nurse Practitioners) who all work together to provide you with the care you need, when you need it.  We recommend signing up for the patient portal called "MyChart".  Sign up information is provided on this After Visit Summary.  MyChart is used to connect with patients for Virtual Visits (Telemedicine).  Patients are able to view lab/test results, encounter notes, upcoming appointments, etc.  Non-urgent messages can be sent to your provider as well.   To learn more about  what you can do with MyChart, go to [email or web address].    Your next appointment:   3-4 month(s)  The format for your next appointment:   In Person  Provider:   You may see Dorris Carnes, MD or one of the following Advanced Practice Providers on your designated Care Team:    Richardson Dopp, PA-C  Robbie Lis, PA-C    Other Instructions      Jarrett Soho, [place]  02/05/2020 10:01 AM    [address], [place], [place]  [address] Phone: [phone]; Fax: [phone]

## 2020-02-05 ENCOUNTER — Other Ambulatory Visit: Payer: Self-pay

## 2020-02-05 ENCOUNTER — Encounter: Payer: Self-pay | Admitting: Physician Assistant

## 2020-02-05 ENCOUNTER — Ambulatory Visit: Payer: PPO | Admitting: Physician Assistant

## 2020-02-05 VITALS — BP 140/82 | HR 73 | Ht 72.0 in | Wt 220.0 lb

## 2020-02-05 DIAGNOSIS — Z01810 Encounter for preprocedural cardiovascular examination: Secondary | ICD-10-CM

## 2020-02-05 DIAGNOSIS — E782 Mixed hyperlipidemia: Secondary | ICD-10-CM | POA: Diagnosis not present

## 2020-02-05 DIAGNOSIS — R0602 Shortness of breath: Secondary | ICD-10-CM

## 2020-02-05 DIAGNOSIS — I10 Essential (primary) hypertension: Secondary | ICD-10-CM | POA: Diagnosis not present

## 2020-02-05 DIAGNOSIS — I2510 Atherosclerotic heart disease of native coronary artery without angina pectoris: Secondary | ICD-10-CM

## 2020-02-05 NOTE — Patient Instructions (Signed)
Medication Instructions:  Your physician recommends that you continue on your current medications as directed. Please refer to the Current Medication list given to you today.  *If you need a refill on your cardiac medications before your next appointment, please call your pharmacy*   Lab Work: None or If you have labs (blood work) drawn today and your tests are completely normal, you will receive your results only by: Marland Kitchen MyChart Message (if you have MyChart) OR . A paper copy in the mail If you have any lab test that is abnormal or we need to change your treatment, we will call you to review the results.   Testing/Procedures: None ordered   Follow-Up: At [facility], you and your health needs are our priority.  As part of our continuing mission to provide you with exceptional heart care, we have created designated Provider Care Teams.  These Care Teams include your primary Cardiologist (physician) and Advanced Practice Providers (APPs -  Physician Assistants and Nurse Practitioners) who all work together to provide you with the care you need, when you need it.  We recommend signing up for the patient portal called "MyChart".  Sign up information is provided on this After Visit Summary.  MyChart is used to connect with patients for Virtual Visits (Telemedicine).  Patients are able to view lab/test results, encounter notes, upcoming appointments, etc.  Non-urgent messages can be sent to your provider as well.   To learn more about what you can do with MyChart, go to [email or web address].    Your next appointment:   3-4 month(s)  The format for your next appointment:   In Person  Provider:   You may see Dorris Carnes, MD or one of the following Advanced Practice Providers on your designated Care Team:    Richardson Dopp, PA-C  Robbie Lis, [place]    Other Instructions

## 2020-02-19 ENCOUNTER — Telehealth: Payer: Self-pay | Admitting: *Deleted

## 2020-02-19 NOTE — Telephone Encounter (Signed)
   Primary Cardiologist: Dorris Carnes, MD  Chart reviewed as part of pre-operative protocol coverage. Patient was contacted 02/19/2020 in reference to pre-operative risk assessment for pending surgery as outlined below.  [name] was last seen on 02/05/20 by Robbie Lis for pre-op clearance for a separate procedure (colonoscopy). He has history of CAD s/p prior PCI then CABG, HLD, stroke/TIA, HTN, DJD, GERD. He remains very physically active and is able to exert >4 METS without any new cardiac symptoms. Patient affirms this is still the case today per our conversation. Therefore, based on ACC/AHA guidelines, the patient would be at acceptable risk for the planned procedure without further cardiovascular testing. He is aware to notify us of if any new cardiac symptoms arise between now and date of surgery.  Previous colonoscopy clearance by APP granted ability to hold Plavix x 5 days but this clearance is requesting to hold for 10 days for lumbar fusion. I will route to Dr. Harrington Challenger to weigh in on whether he is cleared to hold for that duration. Dr. Harrington Challenger - Please route response to P CV DIV PREOP (the pre-op pool). Thank you.  Charlie Pitter, PA-C 02/19/2020, 11:00 AM

## 2020-02-19 NOTE — Telephone Encounter (Signed)
° °  [facility] Pre-operative Risk Assessment    HEARTCARE STAFF: - Please ensure there is not already an duplicate clearance open for this procedure. - Under Visit Info/Reason for Call, type in Other and utilize the format Clearance MM/DD/YY or Clearance TBD. Do not use dashes or single digits. - If request is for dental extraction, please clarify the # of teeth to be extracted.  Request for surgical clearance:  1. What type of surgery is being performed? L4-5 POSTERIOR LUMBAR INTERBODY FUSION   2. When is this surgery scheduled? TBD   3. What type of clearance is required (medical clearance vs. Pharmacy clearance to hold med vs. Both)? MEDICAL  4. Are there any medications that need to be held prior to surgery and how long? PLAVIX x 10 DAYS PRIOR TO PROCEDURE per DR. CABBELL   5. Practice name and name of physician performing surgery? [place]; DR. CABBELL   6. What is the office phone number? [phone]   7.   What is the office fax number? [place]: JESSICA  8.   Anesthesia type (None, local, MAC, general) ? GENERAL   Julaine Hua 02/19/2020, 10:03 AM  _________________________________________________________________   (provider comments below)

## 2020-02-19 NOTE — Telephone Encounter (Signed)
Reviewed I think he is OK to hold plavix prior to colonoscopy

## 2020-02-20 ENCOUNTER — Other Ambulatory Visit: Payer: Self-pay | Admitting: Neurosurgery

## 2020-02-20 NOTE — Telephone Encounter (Signed)
   Primary Cardiologist: Dorris Carnes, MD  Chart reviewed as part of pre-operative protocol coverage.   Per Dr. Harrington Challenger, [name] may proceed with lumbar surgery as requested and she has cleared the patient to hold ASA/Plavix for 10 days prior. The original clearance fax only included request to hold Plavix so if surgeon feels that only Plavix needs to be held for this kind of procedure, the patient may do so.  The patient was advised that if he develops new symptoms prior to surgery to contact our office to arrange for a follow-up visit, and he verbalized understanding.  I will route this recommendation to the requesting party via Epic fax function and remove from pre-op pool.  Please call with questions.  Charlie Pitter, PA-C 02/20/2020, 1:10 PM

## 2020-02-20 NOTE — Telephone Encounter (Signed)
Dr. Harrington Challenger - can you please clarify further. Procedure below is for lumbar surgery not colonoscopy, request to hold Plavix for 10 days. Thanks.

## 2020-02-20 NOTE — Telephone Encounter (Signed)
Sorry for misunderstanding  Pt ok to proceed with lumbar surgery   I know they arevery concerned about bleeding   OK to hold asa/plavix 10 days prior

## 2020-03-04 ENCOUNTER — Telehealth: Payer: Self-pay

## 2020-03-04 ENCOUNTER — Telehealth: Payer: Self-pay | Admitting: Gastroenterology

## 2020-03-04 NOTE — Telephone Encounter (Signed)
Left message for pt to call back  °

## 2020-03-04 NOTE — Telephone Encounter (Signed)
Pt is requesting a call back from a nurse to discuss the prescription he needs to pick up prior to his endoscopy

## 2020-03-04 NOTE — Telephone Encounter (Signed)
Patient returned call to state that he has had his covid vaccination.   Riki Sheer, LPN ( Admitting )

## 2020-03-04 NOTE — Telephone Encounter (Signed)
Patient was called to see if he has had his Covid vaccination. No answer. Left message for patient to call us back.   Riki Sheer, LPN ( admitting )

## 2020-03-05 ENCOUNTER — Telehealth: Payer: Self-pay | Admitting: *Deleted

## 2020-03-05 NOTE — Telephone Encounter (Signed)
Spoke with pt- he states he completed both doses of covid vaccine greater than 2 weeks ago.

## 2020-03-06 ENCOUNTER — Other Ambulatory Visit: Payer: Self-pay

## 2020-03-06 ENCOUNTER — Encounter: Payer: Self-pay | Admitting: Gastroenterology

## 2020-03-06 ENCOUNTER — Ambulatory Visit: Payer: No Typology Code available for payment source | Admitting: Gastroenterology

## 2020-03-06 VITALS — BP 116/67 | HR 68 | Temp 96.8°F | Ht 72.0 in | Wt 210.0 lb

## 2020-03-06 MED ORDER — FLEET ENEMA 7-19 GM/118ML RE ENEM
1.0000 | ENEMA | Freq: Once | RECTAL | Status: AC
  Administered 2020-03-06: 1 via RECTAL

## 2020-03-06 MED ORDER — SODIUM CHLORIDE 0.9 % IV SOLN: 500.0000 mL | Freq: Once | INTRAVENOUS | Status: DC

## 2020-03-06 NOTE — Progress Notes (Signed)
Pt offered to come in on 03-08-20 at 700 for his procedure- he states he has to work. Pt is on Plavix and has been off of this for 10 days.  Per Dr. Tarri Glenn, pt really should be done as soon as possible- d/t being on Plavix for 10 days and also that pt was seen in the office 2 months ago.  This is explained to pt.   He states he will attempt to find a driver for 0-67-70 procedure.  I instructed him [id number] prep and to stay on clear liquids today.  Direct number to admitting given to pt and he will call back as soon as possible when he knows if he can come 03-08-20.

## 2020-03-06 NOTE — Progress Notes (Signed)
Vital signs checked by:JK  The medical and surgical history was reviewed and verified with the patient.  Patient reports last BM was dark and unable to see through to the bottom.  Per MD order fleet enema administered.  RN visualized next BM which was still very cloudy and dark.  Per MD recommendation rescheduling is the best option at this point.  Patient has been provided with several appointment options and he states he will call back to set appointment.

## 2020-03-07 ENCOUNTER — Ambulatory Visit: Payer: No Typology Code available for payment source | Admitting: Gastroenterology

## 2020-03-07 ENCOUNTER — Encounter: Payer: Self-pay | Admitting: Gastroenterology

## 2020-03-07 ENCOUNTER — Ambulatory Visit (AMBULATORY_SURGERY_CENTER): Payer: PPO | Admitting: Gastroenterology

## 2020-03-07 VITALS — BP 135/72 | HR 64 | Temp 96.8°F | Resp 13 | Ht 72.0 in | Wt 210.0 lb

## 2020-03-07 DIAGNOSIS — R1319 Other dysphagia: Secondary | ICD-10-CM

## 2020-03-07 DIAGNOSIS — K2270 Barrett's esophagus without dysplasia: Secondary | ICD-10-CM | POA: Diagnosis not present

## 2020-03-07 DIAGNOSIS — R1310 Dysphagia, unspecified: Secondary | ICD-10-CM

## 2020-03-07 DIAGNOSIS — K3189 Other diseases of stomach and duodenum: Secondary | ICD-10-CM

## 2020-03-07 DIAGNOSIS — K625 Hemorrhage of anus and rectum: Secondary | ICD-10-CM

## 2020-03-07 DIAGNOSIS — K649 Unspecified hemorrhoids: Secondary | ICD-10-CM

## 2020-03-07 DIAGNOSIS — K635 Polyp of colon: Secondary | ICD-10-CM | POA: Diagnosis not present

## 2020-03-07 DIAGNOSIS — D120 Benign neoplasm of cecum: Secondary | ICD-10-CM

## 2020-03-07 HISTORY — PX: UPPER GASTROINTESTINAL ENDOSCOPY: SHX188

## 2020-03-07 HISTORY — PX: COLONOSCOPY: SHX174

## 2020-03-07 MED ORDER — SODIUM CHLORIDE 0.9 % IV SOLN: 500.0000 mL | Freq: Once | INTRAVENOUS | Status: DC

## 2020-03-07 NOTE — Progress Notes (Signed)
To PACU, VSS. Report to [email or web address] 

## 2020-03-07 NOTE — Telephone Encounter (Signed)
Pt had procedure today.

## 2020-03-07 NOTE — Patient Instructions (Signed)
RESUME Plavix at prior dose tomorrow 03/08/20  Handouts Provided:  Polyps  YOU HAD AN ENDOSCOPIC PROCEDURE TODAY AT [facility]:   Refer to the procedure report that was given to you for any specific questions about what was found during the examination.  If the procedure report does not answer your questions, please call your gastroenterologist to clarify.  If you requested that your care partner not be given the details of your procedure findings, then the procedure report has been included in a sealed envelope for you to review at your convenience later.  YOU SHOULD EXPECT: Some feelings of bloating in the abdomen. Passage of more gas than usual.  Walking can help get rid of the air that was put into your GI tract during the procedure and reduce the bloating. If you had a lower endoscopy (such as a colonoscopy or flexible sigmoidoscopy) you may notice spotting of blood in your stool or on the toilet paper. If you underwent a bowel prep for your procedure, you may not have a normal bowel movement for a few days.  Please Note:  You might notice some irritation and congestion in your nose or some drainage.  This is from the oxygen used during your procedure.  There is no need for concern and it should clear up in a day or so.  SYMPTOMS TO REPORT IMMEDIATELY:   Following lower endoscopy (colonoscopy or flexible sigmoidoscopy):  Excessive amounts of blood in the stool  Significant tenderness or worsening of abdominal pains  Swelling of the abdomen that is new, acute  Fever of 100F or higher   Following upper endoscopy (EGD)  Vomiting of blood or coffee ground material  New chest pain or pain under the shoulder blades  Painful or persistently difficult swallowing  New shortness of breath  Fever of 100F or higher  Black, tarry-looking stools  For urgent or emergent issues, a gastroenterologist can be reached at any hour by calling [phone]. Do not use MyChart  messaging for urgent concerns.    DIET:  We do recommend a small meal at first, but then you may proceed to your regular diet.  Drink plenty of fluids but you should avoid alcoholic beverages for 24 hours.  ACTIVITY:  You should plan to take it easy for the rest of today and you should NOT DRIVE or use heavy machinery until tomorrow (because of the sedation medicines used during the test).    FOLLOW UP: Our staff will call the number listed on your records 48-72 hours following your procedure to check on you and address any questions or concerns that you may have regarding the information given to you following your procedure. If we do not reach you, we will leave a message.  We will attempt to reach you two times.  During this call, we will ask if you have developed any symptoms of COVID 19. If you develop any symptoms (ie: fever, flu-like symptoms, shortness of breath, cough etc.) before then, please call [phone].  If you test positive for Covid 19 in the 2 weeks post procedure, please call and report this information to [place].    If any biopsies were taken you will be contacted by phone or by letter within the next 1-3 weeks.  Please call us at [phone] if you have not heard about the biopsies in 3 weeks.    SIGNATURES/CONFIDENTIALITY: You and/or your care partner have signed paperwork which will be entered into your electronic medical record.  These signatures attest to the fact that that the information above on your After Visit Summary has been reviewed and is understood.  Full responsibility of the confidentiality of this discharge information lies with you and/or your care-partner.

## 2020-03-07 NOTE — Progress Notes (Signed)
Called to room to assist during endoscopic procedure.  Patient ID and intended procedure confirmed with present staff. Received instructions for my participation in the procedure from the performing physician.  

## 2020-03-07 NOTE — Op Note (Signed)
[name]: [name] Procedure Date: 03/07/2020 7:41 AM MRN: [id number] Endoscopist: Thornton Park MD, MD Age: 79 Referring MD:  Date of Birth: 09-06-1940 Gender: Male Account #: [id number] Procedure:                Upper GI endoscopy Indications:              Dysphagia                           Bloating                           Solid food dysphagia                           Barrett's Esophagus Medicines:                Monitored Anesthesia Care Procedure:                Pre-Anesthesia Assessment:                           - Prior to the procedure, a History and Physical                            was performed, and patient medications and                            allergies were reviewed. The patient's tolerance of                            previous anesthesia was also reviewed. The risks                            and benefits of the procedure and the sedation                            options and risks were discussed with the patient.                            All questions were answered, and informed consent                            was obtained. Prior Anticoagulants: The patient has                            taken Plavix (clopidogrel), last dose was 6 days                            prior to procedure. ASA Grade Assessment: III - A                            patient with severe systemic disease. After                            reviewing the risks and benefits, the patient  was                            deemed in satisfactory condition to undergo the                            procedure.                           After obtaining informed consent, the endoscope was                            passed under direct vision. Throughout the                            procedure, the patient's blood pressure, pulse, and                            oxygen saturations were monitored continuously. The                            Endoscope was introduced  through the mouth, and                            advanced to the third part of duodenum. The upper                            GI endoscopy was accomplished without difficulty.                            The patient tolerated the procedure well. Scope In: Scope Out: Findings:                 There were esophageal mucosal changes consistent                            with short-segment Barrett's esophagus present in                            the distal esophagus from 35 to 36 cm from the                            incisors. The maximum longitudinal extent of these                            mucosal changes was 1 cm in length. Mucosa was                            biopsied with a cold forceps for histology in 4                            quadrants at intervals of 1 cm in the lower third  of the esophagus. One specimen bottle was sent to                            pathology. The esophageal lumen and mucosa                            otherwise appeared normal.                           The entire examined stomach was normal. Biopsies                            were taken from the antrum, body, and fundus with a                            cold forceps for histology. Estimated blood loss                            was minimal.                           The examined duodenum was normal. Complications:            No immediate complications. Estimated blood loss:                            Minimal. Estimated Blood Loss:     Estimated blood loss was minimal. Impression:               - Esophageal mucosal changes consistent with                            short-segment Barrett's esophagus. Biopsied.                           - Normal stomach. Biopsied.                           - Normal examined duodenum. Recommendation:           - Patient has a contact number available for                            emergencies. The signs and symptoms of potential                             delayed complications were discussed with the                            patient. Return to normal activities tomorrow.                            Written discharge instructions were provided to the                            patient.                           -  Resume previous diet.                           - Resume Plavix (clopidogrel) at prior dose                            tomorrow.                           - Await pathology results.                           - UGI series                           - Follow-up in the office after the esophagram.                           - Proceed with colonoscopy today as previously                            scheduled. Thornton Park MD, MD 03/07/2020 8:59:26 AM This report has been signed electronically.

## 2020-03-07 NOTE — Op Note (Signed)
[place] Patient Name: [name] Procedure Date: 03/07/2020 7:40 AM MRN: [id number] Endoscopist: Thornton Park MD, MD Age: 79 Referring MD:  Date of Birth: 03-21-1941 Gender: Male Account #: [id number] Procedure:                Colonoscopy Indications:              Rectal bleeding Medicines:                Monitored Anesthesia Care Procedure:                Pre-Anesthesia Assessment:                           - Prior to the procedure, a History and Physical                            was performed, and patient medications and                            allergies were reviewed. The patient's tolerance of                            previous anesthesia was also reviewed. The risks                            and benefits of the procedure and the sedation                            options and risks were discussed with the patient.                            All questions were answered, and informed consent                            was obtained. Prior Anticoagulants: The patient has                            taken Plavix (clopidogrel), last dose was 6 days                            prior to procedure. ASA Grade Assessment: III - A                            patient with severe systemic disease. After                            reviewing the risks and benefits, the patient was                            deemed in satisfactory condition to undergo the                            procedure.  After obtaining informed consent, the colonoscope                            was passed under direct vision. Throughout the                            procedure, the patient's blood pressure, pulse, and                            oxygen saturations were monitored continuously. The                            Colonoscope was introduced through the anus and                            advanced to the 3 cm into the ileum. The                             colonoscopy was technically difficult and complex                            due to a redundant colon, significant looping and a                            tortuous colon. Successful completion of the                            procedure was aided by changing the patient's                            position, withdrawing and reinserting the scope and                            applying abdominal pressure. The patient tolerated                            the procedure well. The quality of the bowel                            preparation was good. The terminal ileum, ileocecal                            valve, appendiceal orifice, and rectum were                            photographed. Scope In: 8:18:18 AM Scope Out: 8:52:15 AM Scope Withdrawal Time: 0 hours 17 minutes 3 seconds  Total Procedure Duration: 0 hours 33 minutes 57 seconds  Findings:                 Non-bleeding external and internal hemorrhoids were                            found.  A diffuse area of mildly melanotic mucosa was found                            in the entire colon. Biopsies were taken with a                            cold forceps for histology. Estimated blood loss                            was minimal.                           A 2 mm polyp was found in the cecum. The polyp was                            flat. The polyp was removed with a cold snare.                            Resection and retrieval were complete. Estimated                            blood loss was minimal.                           The exam was otherwise without abnormality on                            direct and retroflexion views.                           The exam was otherwise without abnormality on                            direct and retroflexion views. Complications:            No immediate complications. Estimated blood loss:                            Minimal. Estimated Blood Loss:     Estimated blood  loss was minimal. Impression:               - Non-bleeding external and internal hemorrhoids.                            Internal hemorrhoids are the likely cause of                            bleeding.                           - Melanotic mucosa in the entire examined colon.                            Biopsied.                           -  One 2 mm polyp in the cecum, removed with a cold                            snare. Resected and retrieved.                           - The examination was otherwise normal on direct                            and retroflexion views.                           - The examination was otherwise normal on direct                            and retroflexion views. Recommendation:           - Patient has a contact number available for                            emergencies. The signs and symptoms of potential                            delayed complications were discussed with the                            patient. Return to normal activities tomorrow.                            Written discharge instructions were provided to the                            patient.                           - Resume previous diet.                           - Continue present medications.                           - Resume Plavix (clopidogrel) at prior dose                            tomorrow.                           - Await pathology results.                           - Emerging evidence supports eating a diet of                            fruits, vegetables, grains, calcium, and yogurt                            while reducing red meat  and alcohol may reduce the                            risk of colon cancer. Thornton Park MD, MD 03/07/2020 9:03:22 AM This report has been signed electronically.

## 2020-03-10 ENCOUNTER — Telehealth: Payer: Self-pay | Admitting: Gastroenterology

## 2020-03-10 NOTE — Telephone Encounter (Signed)
Pt. Stated "I am already jamming up again,i have taken miralax x2 and ducolax x1 and have not been able to have a bowel movement yet.denies pain but is having gas.stated "yes i am uncomfortable,explained to pt. That I will make doctor aware and call him back on tomorrow when I get response from doctor,he said that's fine.

## 2020-03-10 NOTE — Telephone Encounter (Signed)
Patient called states he has not been able to have a BM and is seeking advise.

## 2020-03-11 ENCOUNTER — Telehealth: Payer: Self-pay

## 2020-03-11 ENCOUNTER — Other Ambulatory Visit: Payer: Self-pay

## 2020-03-11 ENCOUNTER — Telehealth: Payer: Self-pay | Admitting: *Deleted

## 2020-03-11 DIAGNOSIS — R1319 Other dysphagia: Secondary | ICD-10-CM

## 2020-03-11 NOTE — Telephone Encounter (Signed)
I recommend a bowel purge. Please do the following: Purchase a bottle of Miralax over the counter as well as a box of 5 mg dulcolax tablets. Take 4 dulcolax tablets. Wait 1 hour. You will then drink 6-8 capfuls of Miralax mixed in an adequate amount of water/juice/gatorade (you may choose which of these liquids to drink) over the next 2-3 hours. You should expect results within 1 to 6 hours after completing the bowel purge.   He should contact his PCP today to discuss, as well. He is a [place] patient and they referred him for endoscopy, but, please find out if they would approve him for an office visit to further discuss, as well. Thanks.

## 2020-03-11 NOTE — Telephone Encounter (Signed)
NO ANSWER, MESSAGE LEFT FOR PATIENT. 

## 2020-03-11 NOTE — Telephone Encounter (Signed)
Pt scheduled for upper gi series at [facility] 03/21/20 at 11am, pt to arrive in the xray department on the first floor at 10:45am. Pt to have nothing to eat or drink after midnight. OV with Dr. Tarri Glenn scheduled for 04/22/20@1 :30apm. Left messaeg for pts son to call back. Pt aware and appt letter mailed to pt regarding OV.

## 2020-03-11 NOTE — Telephone Encounter (Signed)
Left message for pt. To call back

## 2020-03-11 NOTE — Telephone Encounter (Signed)
  Follow up Call-  Call back number 03/07/2020 03/06/2020  Post procedure Call Back phone  # [phone]  Permission to leave phone message Yes Yes  Some recent data might be hidden     Patient questions:  Do you have a fever, pain , or abdominal swelling? No. Pain Score  0 *  Have you tolerated food without any problems? Yes.    Have you been able to return to your normal activities? Yes.    Do you have any questions about your discharge instructions: Diet   No. Medications  No. Follow up visit  No.  Do you have questions or concerns about your Care? No.  Actions: * If pain score is 4 or above: No action needed, pain <4.  1. Have you developed a fever since your procedure? no  2.   Have you had an respiratory symptoms (SOB or cough) since your procedure? no  3.   Have you tested positive for COVID 19 since your procedure no  4.   Have you had any family members/close contacts diagnosed with the COVID 19 since your procedure?  no   If yes to any of these questions please route to Joylene John, RN and Joella Prince, RN

## 2020-03-11 NOTE — Telephone Encounter (Signed)
Spoke with pt. And gave him Dr. Tarri Glenn recommendations,he verbalize understanding of bowel purge and that he needs to follow up with PCP and to see if he could get approval to come in to see Dr. Tarri Glenn.

## 2020-03-21 ENCOUNTER — Ambulatory Visit (HOSPITAL_COMMUNITY): Admission: RE | Admit: 2020-03-21 | Payer: No Typology Code available for payment source | Source: Ambulatory Visit

## 2020-03-21 ENCOUNTER — Inpatient Hospital Stay: Admit: 2020-03-21 | Payer: No Typology Code available for payment source | Admitting: Neurosurgery

## 2020-03-21 SURGERY — POSTERIOR LUMBAR FUSION 1 LEVEL
Anesthesia: General

## 2020-03-27 ENCOUNTER — Encounter: Payer: No Typology Code available for payment source | Admitting: Gastroenterology

## 2020-04-22 ENCOUNTER — Encounter: Payer: Self-pay | Admitting: Gastroenterology

## 2020-04-22 ENCOUNTER — Ambulatory Visit (INDEPENDENT_AMBULATORY_CARE_PROVIDER_SITE_OTHER): Payer: No Typology Code available for payment source | Admitting: Gastroenterology

## 2020-04-22 VITALS — BP 132/80 | HR 66 | Ht 72.0 in | Wt 213.4 lb

## 2020-04-22 DIAGNOSIS — K5900 Constipation, unspecified: Secondary | ICD-10-CM | POA: Diagnosis not present

## 2020-04-22 DIAGNOSIS — K2270 Barrett's esophagus without dysplasia: Secondary | ICD-10-CM | POA: Diagnosis not present

## 2020-04-22 DIAGNOSIS — R1310 Dysphagia, unspecified: Secondary | ICD-10-CM

## 2020-04-22 DIAGNOSIS — R1319 Other dysphagia: Secondary | ICD-10-CM | POA: Diagnosis not present

## 2020-04-22 MED ORDER — FAMOTIDINE 20 MG PO TABS: 20.0000 mg | ORAL_TABLET | Freq: Two times a day (BID) | ORAL | 2 refills | Status: AC

## 2020-04-22 MED ORDER — ESOMEPRAZOLE MAGNESIUM 40 MG PO CPDR: 40.0000 mg | DELAYED_RELEASE_CAPSULE | Freq: Two times a day (BID) | ORAL | 2 refills | Status: AC

## 2020-04-22 MED ORDER — LINACLOTIDE 145 MCG PO CAPS: 145.0000 ug | ORAL_CAPSULE | Freq: Every day | ORAL | 2 refills | Status: DC

## 2020-04-22 NOTE — Patient Instructions (Addendum)
I would like for you to continue your acid blocking. I would like to add famotidine 20 mg twice daily to see if this might provide some additional relief.  We will reschedule your swallowing x-ray.  You have been RESCHEDULED for an Upper GI Series and Small Bowel Follow Thru at [facility] (1st FLOOR). Your appointment is on 05/05/20 at 10:30AM. Please arrive 15 minutes prior to your test for registration. Make certain not to have anything to eat or drink after midnight on the night before your test. If you need to reschedule, please contact radiology at [phone]. --------------------------------------------------------------------------------------------------------------- An upper GI series uses x rays to help diagnose problems of the upper GI tract, which includes the esophagus, stomach, and duodenum. The duodenum is the first part of the small intestine. An upper GI series is conducted by a radiology technologist or a radiologist--a doctor who specializes in x-ray imaging--at a hospital or outpatient center. While sitting or standing in front of an x-ray machine, the patient drinks barium liquid, which is often white and has a chalky consistency and taste. The barium liquid coats the lining of the upper GI tract and makes signs of disease show up more clearly on x rays. X-ray video, called fluoroscopy, is used to view the barium liquid moving through the esophagus, stomach, and duodenum. Additional x rays and fluoroscopy are performed while the patient lies on an x-ray table. To fully coat the upper GI tract with barium liquid, the technologist or radiologist may press on the abdomen or ask the patient to change position. Patients hold still in various positions, allowing the technologist or radiologist to take x rays of the upper GI tract at different angles. If a technologist conducts the upper GI series, a radiologist will later examine the images to look for problems.  This test  typically takes about 1 hour to complete --------------------------------------------------------------------------------------------------------------------------------------------- The Small Bowel Follow Thru examination is used to visualize the entire small bowel (intestines); specifically the connection between the small and large intestine. You will be positioned on a flat x-ray table and an image of your abdomen taken. Then the technologist will show the x-ray to the radiologist. The radiologist will instruct your technologist how much (1-2 cups) barium sulfate you will drink and when to begin taking the timed x-rays, usually 15-30 minutes after you begin drinking. Barium is a harmless substance that will highlight your small intestine by absorbing x-ray. The taste is chalky and it feels very heavy both in the cup and in your stomach.  After the first x-ray is taken and shown to the radiologist, he/she will determine when the next image is to be taken. This is repeated until the barium has reached the end of the small intestine and enters the beginning of the colon (cecum). At such time when the barium spills into the colon, you will be positioned on the x-ray table once again. The radiologist will use a fluoroscopic camera to take some detailed pictures of the connection between your small intestine and colon. The fluoroscope is an x-ray unit that works with a television/computer screen. The radiologist will apply pressure to your abdomen with his/her hand and a lead glove, a plastic paddle, or a paddle with an inflated rubber balloon on the end. This is to spread apart your loops of intestine so he/she can see all areas.   This test typically takes around 1 hour to complete.  Drink plenty of water (8-10 cups/day) for a few days following the procedure  to avoid constipation and blockage. The barium will make your stools white for a few  days. --------------------------------------------------------------------------------------------------------------------------------------------  I would like for you to try Linzess 145 mcg daily. This should help with your constipation. The downside is that it can cause diarrhea. If that is the case, please try to adjust the dose to meet your needs.  PRESCRIPTION MEDICATION(S): We have sent the following medication(s) to your pharmacy:  . Nexium, Pepcid and Linzess  Please talk to you primary care provider about your dry eyes and dry mouth.   If you are age 80 or older, your body mass index should be between 23-30. Your Body mass index is 28.94 kg/m. If this is out of the aforementioned range listed, please consider follow up with your Primary Care Provider.  I would like to see you after your x-rays.   Thank you for trusting me with your gastrointestinal care!    Thornton Park, MD, MPH

## 2020-04-22 NOTE — Progress Notes (Signed)
Referring Provider: [facility] Primary Care Physician:  [facility]  Reason for Consultation:  Dry throat, hard to swallow   IMPRESSION:  Solid food dysphagia, bloating, early satiety Longstanding constipation with melanosis coli GERD with large hiatal hernia - despite PPI Barrett's Esophagus without dysplasia History of polyps    - SSA 2010    - hyperplastic polyp 2016    - no polyps on colonoscopy 2021 Frequent NSAIDs  Solid food dysphagia: Symptoms today sound like xerostomia +/- oropharyngeal. Barium esophagram +/- esophageal manometry based on the results of the esophagram.     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  Plan EGD in 3 years if clinically appropriate at that time.  Constipation: Trial of Linzess given his limited response to Dulcolax, Miralax, MOM, prune juice.   Sicca syndrome: May be due to medications, age-related atrophy of secreting tissue and resultant declines in tear and basal, +/- Sjogren's. I have encouraged him to follow-up with his PCP for further evaluation.     PLAN: Reschedule barium esophagram Continue esomeprazole 40 mg BID, add famotidine 20 mg BID Add Linzess 145 mcg daily Follow-up with PCP re: sicca symptoms  Please see the "Patient Instructions" section for addition details about the plan.  HPI: [name] is a 79 y.o. male who returned in scheduled follow-up. He has a history of anemia, testicular cancer, chronic kidney disease, cardiomyopathy, coronary artery disease, pre-diabetes, hypercholesterolemia, TIA, and kidney stones. Records show a diagnosis of Barrett's esophagus with surveillance in 2008, 2010, and 2016. He has a history of colon polyps with an SSA removed in 2010, hyperplastic polyp in 2016, and poor prep in 2019, with those procedures all performed at the [place]. Random colon biopsies in 2019 were normal.  He is on long-term Plavix use.   Seen in the [place] [facility]  office visit in 2019 for dry mouth, solid food dysphagia, early satiety. Longstanding history of constipation over the last year, sometimes for up to 2 weeks between bowel movements. Feels bloating with severe constipation. Then he will have multiple BM in one day. Frequently black. Sense of incomplete evacuation.  Initially some some bright red blood.   CT of the abdomen and pelvis with and without contrast at the [facility] 12/27/2019 showed renal stones, left hydroureter oephrosis, cholelithiasis, a large hiatal hernia, a splenic artery aneurysm, and lower lumbar spondylosis with spinal stenosis.  Seen in consultation 01/11/20 with bloating, solid food dysphagia, early satiety, constipation, possible melena, reflux, and normocytic anemia. Baseline BM were daily to every other day.  Weight was stable.  Appetite was good.  He was using Aleve for back pain and Goody's for headaches.  He had endoscopic evaluation 03/07/2020. EGD revealed short segment Barrett's esophagus in the distal esophagus from 35 to 36 cm and a hiatal hernia.  There was no dysplasia seen on biopsies.  Gastric biopsies showed antral and oxyntic mucosa with hyperemia.  Colonoscopy revealed nonbleeding internal and external hemorrhoids, melanosis coli, and a small cecal polyp that was actually polypoid appearing normal mucosa.  Today he reports concerns with constipation. Continuing to need Dulcolax, and Miralax, MOM, prune juice. No real bowel movement without Dulcolax.   Feels like his throat becomes so dry that he can't swallow, notes dry heaves at night, and severe brash despite esomeprazole BID. No odynophagia.  Associated dry eyes.   Limited family history. No known family history of colon cancer or polyps. No family history of uterine/endometrial cancer, pancreatic cancer or  gastric/stomach cancer.   Past Medical History:  Diagnosis Date   Anginal pain ([place])    pressure- last time 11/23/12 saw Dr Harrington Challenger   CAD, NATIVE VESSEL 07/22/2008     a.  s/p DES to LAD in 2009; s/p repeat DES to LAD 11/2008 (prox to previous stent).   Constipation    DIZZINESS, CHRONIC 02/27/2009   DJD (degenerative joint disease)    Edema 12/05/2008   GERD (gastroesophageal reflux disease)    History of kidney stones    HYPERLIPIDEMIA-MIXED 07/22/2008   HYPERTENSION, BENIGN 07/22/2008   Lung nodule    RLL lung nodule (PET CT 1/10 negative);R parotid hypermetabolic on PET 12/4079   Shortness of breath    with exertion   Stroke [facility]) 1998ish   TIA  , Left hand stinging , hurts to flex   Testicular cancer [facility])     Past Surgical History:  Procedure Laterality Date   APPENDECTOMY     Bilateral orchiectomy     COLONOSCOPY  03/07/2020   CORONARY ARTERY BYPASS GRAFT N/A 12/01/2012   Procedure: CORONARY ARTERY BYPASS GRAFTING (CABG);  Surgeon: Gaye Pollack, MD;  LIMA to OM and RIMA to RCA   KIDNEY STONE SURGERY  10/19/2016   ROTATOR CUFF REPAIR     right   TONSILLECTOMY     TOTAL HIP ARTHROPLASTY Left    UPPER GASTROINTESTINAL ENDOSCOPY  03/07/2020   Veins stripped      Current Outpatient Medications  Medication Sig Dispense Refill   Ascorbic Acid (VITAMIN C) 1000 MG tablet Take 1,000 mg by mouth at bedtime.     aspirin EC 81 MG tablet Take 81 mg by mouth daily.      Aspirin-Acetaminophen-Caffeine (GOODY HEADACHE PO) Take 1-2 packets by mouth 2 (two) times daily as needed (pain/headaches).      atorvastatin (LIPITOR) 40 MG tablet Take 1 tablet (40 mg total) by mouth daily. 30 tablet 4   b complex vitamins tablet Take 1 tablet by mouth 2 (two) times daily.     cholecalciferol (VITAMIN D) 1000 UNITS tablet Take 1,000 Units by mouth 2 (two) times daily.      clopidogrel (PLAVIX) 75 MG tablet Take 75 mg by mouth daily.     Coenzyme Q10 (CO Q 10) 100 MG CAPS Take 100 mg by mouth daily.      esomeprazole (NEXIUM) 40 MG capsule Take 40 mg by mouth 2 (two) times daily before a meal.     Flaxseed, Linseed, (FLAX SEED OIL PO)  Take 1 capsule by mouth daily.      folic acid (FOLVITE) 1 MG tablet Take 1 mg by mouth 2 (two) times daily.     gabapentin (NEURONTIN) 300 MG capsule Take 300 mg by mouth at bedtime.     Menthol, Topical Analgesic, (ICY HOT POWER) 16 % GEL Apply 1 application topically daily as needed (pain).     Misc Natural Products (OSTEO BI-FLEX JOINT SHIELD PO) Take 1 tablet by mouth 2 (two) times daily.      Multiple Vitamins-Minerals (CENTRUM SILVER 50+MEN) TABS Take 1 tablet by mouth 2 (two) times daily.     Nerve Stimulator (TENS THERAPY PAIN RELIEF) DEVI 1 application by Does not apply route daily as needed.     nitroGLYCERIN (NITROSTAT) 0.4 MG SL tablet Place 1 tablet (0.4 mg total) under the tongue every 5 (five) minutes as needed for chest pain. As directed 25 tablet 3   Omega-3 Fatty Acids (FISH OIL) 500 MG CAPS  Take 500 mg by mouth 2 (two) times daily.     PSYLLIUM PO Take by mouth daily.     sennosides-docusate sodium (SENOKOT-S) 8.6-50 MG tablet Take 1 tablet by mouth as needed for constipation.     TURMERIC PO Take 1 capsule by mouth daily.     No current facility-administered medications for this visit.    Allergies as of 04/22/2020 - Review Complete 03/07/2020  Allergen Reaction Noted   Oxybutynin Itching and Other (See Comments) 06/07/2017    Family History  Problem Relation Age of Onset   Aneurysm Brother    Kidney disease Brother    Heart disease Mother    Hypertension Mother    Diabetes Mother    COPD Father    Heart attack Neg Hx    Stroke Neg Hx     Social History   Socioeconomic History   Marital status: Widowed    Spouse name: Not on file   Number of children: 2   Years of education: 14   Highest education level: Not on file  Occupational History   Occupation: Event planner    Occupation: Security guard  Tobacco Use   Smoking status: Never Smoker   Smokeless tobacco: Never Used  Scientific laboratory technician Use: Never used  Substance  and Sexual Activity   Alcohol use: Yes    Comment: rare   Drug use: No   Sexual activity: Not Currently  Other Topics Concern   Not on file  Social History Narrative   Fun: Watch baseball, disaster mission through [employer] and travel.   Denies abuse and feels safe at home.    Social Determinants of Health   Financial Resource Strain:    Difficulty of Paying Living Expenses: Not on file  Food Insecurity:    Worried About Charity fundraiser in the Last Year: Not on file   [employer] of Food in the Last Year: Not on file  Transportation Needs:    Lack of Transportation (Medical): Not on file   Lack of Transportation (Non-Medical): Not on file  Physical Activity:    Days of Exercise per Week: Not on file   Minutes of Exercise per Session: Not on file  Stress:    Feeling of Stress : Not on file  Social Connections:    Frequency of Communication with Friends and Family: Not on file   Frequency of Social Gatherings with Friends and Family: Not on file   Attends Religious Services: Not on file   Active Member of Clubs or Organizations: Not on file   Attends Archivist Meetings: Not on file   Marital Status: Not on file  Intimate Partner Violence:    Fear of Current or Ex-Partner: Not on file   Emotionally Abused: Not on file   Physically Abused: Not on file   Sexually Abused: Not on file     Physical Exam: General:   Alert,  well-nourished, pleasant and cooperative in NAD Head:  Normocephalic and atraumatic. Eyes:  Sclera clear, no icterus.   Conjunctiva pink. Abdomen:  Soft, central obesity, nontender, nondistended, normal bowel sounds, no rebound or guarding. No hepatosplenomegaly.   Neurologic:  Alert and  oriented x4;  grossly nonfocal Skin:  Intact without significant lesions or rashes. Psych:  Alert and cooperative. Normal mood and affect.     Teo Moede L. Tarri Glenn, MD, MPH 04/22/2020, 1:30 PM

## 2020-05-02 ENCOUNTER — Telehealth: Payer: Self-pay

## 2020-05-02 NOTE — Telephone Encounter (Signed)
Pt last seen 04/22/20 by Dr. Tarri Glenn. Barium Esophagram was rescheduled d/t having NS'd for previously scheduled exam. Given NS, wanted to call pt with reminder about his exam scheduled Monday, 05/05/20. Unable to reach pt. LVM requesting returned call. Also called son, Coralyn Pear (HIPAA) and LVM advising him to have pt give [place] a call.

## 2020-05-05 ENCOUNTER — Ambulatory Visit (HOSPITAL_COMMUNITY)
Admission: RE | Admit: 2020-05-05 | Discharge: 2020-05-05 | Disposition: A | Discharge status: Home / Self Care | Payer: No Typology Code available for payment source | Source: Ambulatory Visit | Attending: Gastroenterology | Admitting: Gastroenterology

## 2020-05-05 ENCOUNTER — Other Ambulatory Visit: Payer: Self-pay

## 2020-05-05 DIAGNOSIS — R1319 Other dysphagia: Secondary | ICD-10-CM | POA: Diagnosis not present

## 2020-05-07 ENCOUNTER — Telehealth: Payer: Self-pay

## 2020-05-07 NOTE — Telephone Encounter (Signed)
Left message for pt to call back  °

## 2020-05-09 NOTE — Telephone Encounter (Signed)
Patient called again and I provided him with the results per Dr. Tarri Glenn patient understood and had no questions at this time also confirmed appointment for follow up 06/05/2020.

## 2020-05-09 NOTE — Telephone Encounter (Signed)
Patient returned your call, please call patient one more time.   

## 2020-05-09 NOTE — Telephone Encounter (Signed)
See result note.  

## 2020-05-25 NOTE — Progress Notes (Deleted)
Cardiology Office Note   Date:  05/25/2020   ID:  GLENNIS MONTENEGRO, DOB November 27, 1940, MRN [id number]  PCP:  [facility]  Cardiologist:   Dorris Carnes, MD   F/U of CAD    History of Present Illness: CAD 72 [name] is a 79 y.o. male with a history of CAD s/p PTCA x 2 then  CABG in 2014 (LIMA to OM; RIMA to RCA)  Pt also with a history of kidney stones   I saw the pt briefly in clinic earlier this year  Pt presents today and he complains of fatigue    Difficult several months.  Has kidney stone surgery x 2   Continues to have  hematuria    TIRED   Doesn't want to do anything      Just got back from [place].  There  for [employer]  Last time he felt he  had energy  May or June   Now feels awful    No outpatient medications have been marked as taking for the 05/26/20 encounter (Appointment) with Fay Records, MD.     Allergies:   Oxybutynin   Past Medical History:  Diagnosis Date  . Anginal pain ([facility])    pressure- last time 11/23/12 saw Dr Harrington Challenger  . CAD, NATIVE VESSEL 07/22/2008    a.  s/p DES to LAD in 2009; s/p repeat DES to LAD 11/2008 (prox to previous stent).  . Constipation   . DIZZINESS, CHRONIC 02/27/2009  . DJD (degenerative joint disease)   . Edema 12/05/2008  . GERD (gastroesophageal reflux disease)   . History of kidney stones   . HYPERLIPIDEMIA-MIXED 07/22/2008  . HYPERTENSION, BENIGN 07/22/2008  . Lung nodule    RLL lung nodule (PET CT 1/10 negative);R parotid hypermetabolic on PET 01/4535  . Shortness of breath    with exertion  . Stroke [facility]) [phone]   TIA  , Left hand stinging , hurts to flex  . Testicular cancer [facility])     Past Surgical History:  Procedure Laterality Date  . APPENDECTOMY    . Bilateral orchiectomy    . COLONOSCOPY  03/07/2020  . CORONARY ARTERY BYPASS GRAFT N/A 12/01/2012   Procedure: CORONARY ARTERY BYPASS GRAFTING (CABG);  Surgeon: Gaye Pollack, MD;  LIMA to OM and RIMA to RCA  . KIDNEY STONE SURGERY  10/19/2016  .  ROTATOR CUFF REPAIR     right  . TONSILLECTOMY    . TOTAL HIP ARTHROPLASTY Left   . UPPER GASTROINTESTINAL ENDOSCOPY  03/07/2020  . Veins stripped       Social History:  The patient  reports that he has never smoked. He has never used smokeless tobacco. He reports current alcohol use. He reports that he does not use drugs.   Family History:  The patient's family history includes Aneurysm in his brother; COPD in his father; Diabetes in his mother; Heart disease in his mother; Hypertension in his mother; Kidney disease in his brother.    ROS:  Please see the history of present illness. All other systems are reviewed and  Negative to the above problem except as noted.    PHYSICAL EXAM: VS:  There were no vitals taken for this visit.  GEN: Overweight 79 yo , in no acute distress  HEENT: normal  Neck: no JVD, carotid bruits, or masses Cardiac: RRR; no murmurs, rubs, or gallops,no edema  Respiratory:  clear to auscultation bilaterally, normal work of breathing GI: soft, nontender,  nondistended, + BS  No hepatomegaly  MS: no deformity Moving all extremities   Skin: warm and dry, no rash Neuro:  Strength and sensation are intact Psych: euthymic mood, full affect   EKG:  EKG is not ordered today.   Lipid Panel    Component Value Date/Time   CHOL 131 05/11/2019 1437   TRIG 105 05/11/2019 1437   HDL 50 05/11/2019 1437   CHOLHDL 2.6 05/11/2019 1437   CHOLHDL 4 04/16/2014 1546   VLDL 37.2 04/16/2014 1546   LDLCALC 62 05/11/2019 1437      Wt Readings from Last 3 Encounters:  04/22/20 213 lb 6 oz (96.8 kg)  03/07/20 210 lb (95.3 kg)  03/06/20 210 lb (95.3 kg)      ASSESSMENT AND PLAN: 1  Fatigue/dysnea the patient's biggest complaint is of fatigue, dyspnea with activity.  He says since much less than 6 months ago 6 months ago he is walking 3 miles per day he does not think he can do that.  This summer he did go on a mission trip to [place] for the [employer] but denies smoke  exposure.  I would set the patient up for labs today (CBC, B met, lipid, TSH.  Fluid status looks okay.  If the above tests are negative I would set him up for a Lexiscan Myoview to rule out inducible ischemia 2  CAD  Pt is 6 years post intervention   Review labs    If normal would set up for a lexiscan myovue   3  HL  Check lipods today    Tentative f/u in 6 weeks   Current medicines are reviewed at length with the patient today.  The patient does not have concerns regarding medicines.  Signed, Dorris Carnes, MD  05/25/2020 10:21 PM    [place], [place], [place]  [address] Phone: [phone]; Fax: [phone]

## 2020-05-26 ENCOUNTER — Ambulatory Visit: Payer: PPO | Admitting: Internal Medicine

## 2020-06-05 ENCOUNTER — Ambulatory Visit (INDEPENDENT_AMBULATORY_CARE_PROVIDER_SITE_OTHER): Payer: PPO | Admitting: Gastroenterology

## 2020-06-05 ENCOUNTER — Encounter: Payer: Self-pay | Admitting: Gastroenterology

## 2020-06-05 VITALS — BP 106/72 | HR 80 | Ht 72.0 in | Wt 213.0 lb

## 2020-06-05 DIAGNOSIS — R1310 Dysphagia, unspecified: Secondary | ICD-10-CM | POA: Diagnosis not present

## 2020-06-05 DIAGNOSIS — K5900 Constipation, unspecified: Secondary | ICD-10-CM

## 2020-06-05 DIAGNOSIS — R682 Dry mouth, unspecified: Secondary | ICD-10-CM | POA: Diagnosis not present

## 2020-06-05 NOTE — Patient Instructions (Addendum)
Some ways to prevent dry mouth: - Take regular sips of water, drink sugar-free liquids, and avoid things like coffee and alcohol - I recommend that you use a humidifier in your bedroom. Run this all day. - You might find that your salivary glands are stimulated by sucking on sugar-free candies and lozenges and by using sugar-free chewing gum.  I am recommending a trial of Biotene oral rinse. This is used as a switch and spit 3 times daily. This should make your throat feel less dry than sipping water.  Given the diarrhea that has occurred with the Linzess, please try to use it as needed to see if this provides a more satisfying response.  If you are using sugar-free candies, you may not need the Linzess as sugar substitutes often cause loose stools.   Please follow-up with your primary care provider at the [facility] about your dry eyes and dry mouth. You may need additional testing to figure out what is going on.

## 2020-06-05 NOTE — Progress Notes (Signed)
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 Given his concurrent dry eyes, I'm concerned about Sicca syndrome.  May be due to medications, age-related atrophy of secreting tissue and resultant declines in tear and basal, +/- Sjogren's. I have encouraged him to follow-up with his PCP for further evaluation. Trial of Biotene now. Reviewed lifestyle modifications that may improve his ability to swallow.   GERD with large hiatal hernia: Continue treatment of GERD. Consider fundoplication for appropriate symptoms.   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  Plan EGD in 3 years if clinically appropriate at that time.  Constipation: Continue Dulcolax, Miralax, MOM, prune juice. Will reduce Linzess to PRN. Discussed that using sugar-free candies to manage dry mouth may provide some secondary relief of constipation.      PLAN: Continue esomeprazole 40 mg BID, add famotidine 20 mg BID Biotene DryBiotene Dry Mouth Gentle oral rinse: Swish and spit 15 mL up to 3 times daily Continue Linzess 145 mcg but switch to PRN dosing schedule Follow-up with PCP re: sicca symptoms Reviewed recommendations to prevent dry mouth as outlined in [name] instructions  Please see Randall "[name] Instructions" section for addition details about Randall plan.  HPI: [name] is a 79 y.o. male who returns in  scheduled follow-up. [name] has a history of anemia, short segment Barrett's esophagus, colon polyps, and constipation. [name] is followed at [facility] [place].   Seen in [facility] [place] [facility] office visit in 2019 for dry mouth, solid food dysphagia, early satiety. Longstanding history of constipation over Randall last year, sometimes for up to 2 weeks between bowel movements. Feels bloating with severe constipation. Then [name] will have multiple BM in one day. Frequently black. Sense of incomplete evacuation.  Initially some some bright red blood.   CT of Randall abdomen and pelvis with and without contrast at [facility] 12/27/2019 showed renal stones, left hydroureter oephrosis, cholelithiasis, a large hiatal hernia, a splenic artery aneurysm, and lower lumbar spondylosis with spinal stenosis.  Seen in consultation 01/11/20 with bloating, solid food dysphagia, early satiety, constipation, possible melena, reflux, and normocytic anemia. Baseline BM were daily to every other day.  Weight was stable.  Appetite was good.  [name] was using Aleve for back pain and Goody's for headaches.  [name] had endoscopic evaluation 03/07/2020. EGD revealed short segment Barrett's esophagus in Randall distal esophagus from 35 to 36 cm and a hiatal hernia.  There was no dysplasia seen on biopsies.  Gastric biopsies showed antral and oxyntic mucosa with hyperemia.  Colonoscopy revealed nonbleeding internal and external hemorrhoids, melanosis coli, and a small cecal polyp that was actually polypoid appearing normal mucosa on histology.  Esophagram 05/05/2020 shows a moderate hiatal hernia with spontaneous gastroesophageal reflux.  No mass or stricture.  [name]'s symptoms were not reliably reproduced during Randall study.  [name] returns today to discuss those results. [name] continues to have dry mouth. Feels  that his throat becomes so dry that [name] can't wallow. Reflux as improved on increased medical management but there has been no improvement in his dry mouth.  No odynophagia.  Associated  dry eyes.   Also has chronic constipation that [name] has treated with Dulcolax, and Miralax, MOM, prune juice. No real bowel movement without Dulcolax. A trial of Linzess 145 mcg daily resulted in diarrhea.     Past Medical History:  Diagnosis Date  . Anginal pain (HCC)    pressure- last time 11/23/12 saw Dr Harrington Challenger  . CAD, NATIVE VESSEL 07/22/2008    a.  s/p DES to LAD in 2009; s/p repeat DES to LAD 11/2008 (prox to previous stent).  . Constipation   . DIZZINESS, CHRONIC 02/27/2009  . DJD (degenerative joint disease)   . Edema 12/05/2008  . GERD (gastroesophageal reflux disease)   . History of kidney stones   . HYPERLIPIDEMIA-MIXED 07/22/2008  . HYPERTENSION, BENIGN 07/22/2008  . Lung nodule    RLL lung nodule (PET CT 1/10 negative);R parotid hypermetabolic on PET 11/3014  . Shortness of breath    with exertion  . Stroke [facility] [place]) [phone]   TIA  , Left hand stinging , hurts to flex  . Testicular cancer [facility])     Past Surgical History:  Procedure Laterality Date  . APPENDECTOMY    . Bilateral orchiectomy    . COLONOSCOPY  03/07/2020  . CORONARY ARTERY BYPASS GRAFT N/A 12/01/2012   Procedure: CORONARY ARTERY BYPASS GRAFTING (CABG);  Surgeon: Gaye Pollack, MD;  LIMA to OM and RIMA to RCA  . KIDNEY STONE SURGERY  10/19/2016  . ROTATOR CUFF REPAIR     right  . TONSILLECTOMY    . TOTAL HIP ARTHROPLASTY Left   . UPPER GASTROINTESTINAL ENDOSCOPY  03/07/2020  . Veins stripped      Current Outpatient Medications  Medication Sig Dispense Refill  . Ascorbic Acid (VITAMIN C) 1000 MG tablet Take 1,000 mg by mouth at bedtime.    Marland Kitchen aspirin EC 81 MG tablet Take 81 mg by mouth daily.     . Aspirin-Acetaminophen-Caffeine (GOODY HEADACHE PO) Take 1-2 packets by mouth 2 (two) times daily as needed (pain/headaches).     Marland Kitchen atorvastatin (LIPITOR) 40 MG tablet Take 1 tablet (40 mg total) by mouth daily. 30 tablet 4  . b complex vitamins tablet Take 1 tablet by mouth 2 (two) times daily.    . cholecalciferol  (VITAMIN D) 1000 UNITS tablet Take 1,000 Units by mouth 2 (two) times daily.     . clopidogrel (PLAVIX) 75 MG tablet Take 75 mg by mouth daily.    . Coenzyme Q10 (CO Q 10) 100 MG CAPS Take 100 mg by mouth daily.     Marland Kitchen esomeprazole (NEXIUM) 40 MG capsule Take 1 capsule (40 mg total) by mouth 2 (two) times daily before a meal. 60 capsule 2  . famotidine (PEPCID) 20 MG tablet Take 1 tablet (20 mg total) by mouth 2 (two) times daily. 60 tablet 2  . Flaxseed, Linseed, (FLAX SEED OIL PO) Take 1 capsule by mouth daily.     . folic acid (FOLVITE) 1 MG tablet Take 1 mg by mouth 2 (two) times daily.    Marland Kitchen gabapentin (NEURONTIN) 300 MG capsule Take 300 mg by mouth at bedtime.    Marland Kitchen linaclotide (LINZESS) 145 MCG CAPS capsule Take 1 capsule (145 mcg total) by mouth daily before breakfast. 30 capsule 2  . Menthol, Topical Analgesic, (ICY HOT POWER) 16 %  GEL Apply 1 application topically daily as needed (pain).    . Misc Natural Products (OSTEO BI-FLEX JOINT SHIELD PO) Take 1 tablet by mouth 2 (two) times daily.     . Multiple Vitamins-Minerals (CENTRUM SILVER 50+MEN) TABS Take 1 tablet by mouth 2 (two) times daily.    . Nerve Stimulator (TENS THERAPY PAIN RELIEF) DEVI 1 application by Does not apply route daily as needed.    . nitroGLYCERIN (NITROSTAT) 0.4 MG SL tablet Place 1 tablet (0.4 mg total) under Randall tongue every 5 (five) minutes as needed for chest pain. As directed 25 tablet 3  . Omega-3 Fatty Acids (FISH OIL) 500 MG CAPS Take 500 mg by mouth 2 (two) times daily.    Marland Kitchen oxybutynin (DITROPAN-XL) 10 MG 24 hr tablet Take 10 mg by mouth at bedtime.    . PSYLLIUM PO Take by mouth daily.    . sennosides-docusate sodium (SENOKOT-S) 8.6-50 MG tablet Take 1 tablet by mouth as needed for constipation.    . TURMERIC PO Take 1 capsule by mouth daily.     No current facility-administered medications for this visit.    Allergies as of 06/05/2020 - Review Complete 04/22/2020  Allergen Reaction Noted  . Oxybutynin  Itching and Other (See Comments) 06/07/2017    Family History  Problem Relation Age of Onset  . Aneurysm Brother   . Kidney disease Brother   . Heart disease Mother   . Hypertension Mother   . Diabetes Mother   . COPD Father   . Heart attack Neg Hx   . Stroke Neg Hx     Social History   Socioeconomic History  . Marital status: Widowed    Spouse name: Not on file  . Number of children: 2  . Years of education: 35  . Highest education level: Not on file  Occupational History  . Occupation: Forensic scientist   . Occupation: Security guard  Tobacco Use  . Smoking status: Never Smoker  . Smokeless tobacco: Never Used  Vaping Use  . Vaping Use: Never used  Substance and Sexual Activity  . Alcohol use: Yes    Comment: rare  . Drug use: No  . Sexual activity: Not Currently  Other Topics Concern  . Not on file  Social History Narrative   Fun: Watch baseball, disaster mission through [employer] and travel.   Denies abuse and feels safe at home.    Social Determinants of Health   Financial Resource Strain: Not on file  Food Insecurity: Not on file  Transportation Needs: Not on file  Physical Activity: Not on file  Stress: Not on file  Social Connections: Not on file  Intimate Partner Violence: Not on file     Physical Exam: General:   Alert,  well-nourished, pleasant and cooperative in NAD Head:  Normocephalic and atraumatic. Eyes:  Sclera clear, no icterus.   Conjunctiva pink. Abdomen:  Soft, central obesity, nontender, nondistended, normal bowel sounds, no rebound or guarding. No hepatosplenomegaly.   Neurologic:  Alert and  oriented x4;  grossly nonfocal Skin:  Intact without significant lesions or rashes. Psych:  Alert and cooperative. Normal mood and affect.     Christopher Hink L. Tarri Glenn, MD, MPH 06/05/2020, 8:41 AM

## 2020-07-10 NOTE — Progress Notes (Signed)
Cardiology Office Note   Date:  07/15/2020   ID:  [name], DOB August 30, 1940, MRN [id number]  PCP:  [facility]  Cardiologist:   Dorris Carnes, MD   F/U of CAD    History of Present Illness: CAD 57 [name] is a 80 y.o. male with a history of CAD s/p PTCA x 2 then  CABG in 2014 (LIMA to OM; RIMA to RCA)  [name] also with a history of kidney stones   I saw [name] in clinic in Nov 2020  He complained of fatgue   Myovue done which was negative for ischemia   He was last seen in cardiology by B Bhagat in Aug 2021    Since seen [name] denies CP   His breathing is stable   Still can get winded   His biggest complaint is that he is tired a lot   Feels he can fall a sleep in a minute if he sits down   Overall energy down Denies PND  No LE edema    Current Meds  Medication Sig  . Ascorbic Acid (VITAMIN C) 1000 MG tablet Take 1,000 mg by mouth at bedtime.  Marland Kitchen aspirin EC 81 MG tablet Take 81 mg by mouth daily.   . Aspirin-Acetaminophen-Caffeine (GOODY HEADACHE PO) Take 1-2 packets by mouth 2 (two) times daily as needed (pain/headaches).   Marland Kitchen atorvastatin (LIPITOR) 40 MG tablet Take 1 tablet (40 mg total) by mouth daily.  Marland Kitchen b complex vitamins tablet Take 1 tablet by mouth 2 (two) times daily.  . cholecalciferol (VITAMIN D) 1000 UNITS tablet Take 1,000 Units by mouth 2 (two) times daily.   . clopidogrel (PLAVIX) 75 MG tablet Take 75 mg by mouth daily.  . Coenzyme Q10 (CO Q 10) 100 MG CAPS Take 100 mg by mouth daily.   Marland Kitchen esomeprazole (NEXIUM) 40 MG capsule Take 1 capsule (40 mg total) by mouth 2 (two) times daily before a meal.  . famotidine (PEPCID) 20 MG tablet Take 1 tablet (20 mg total) by mouth 2 (two) times daily.  . Flaxseed, Linseed, (FLAX SEED OIL PO) Take 1 capsule by mouth daily.   . folic acid (FOLVITE) 1 MG tablet Take 1 mg by mouth 2 (two) times daily.  Marland Kitchen gabapentin (NEURONTIN) 300 MG capsule Take 300 mg by mouth at bedtime.  Marland Kitchen linaclotide (LINZESS) 145 MCG CAPS  capsule Take 1 capsule (145 mcg total) by mouth daily before breakfast.  . Menthol, Topical Analgesic, 16 % GEL Apply 1 application topically daily as needed (pain).  . Misc Natural Products (OSTEO BI-FLEX JOINT SHIELD PO) Take 1 tablet by mouth 2 (two) times daily.   . Multiple Vitamins-Minerals (CENTRUM SILVER 50+MEN) TABS Take 1 tablet by mouth 2 (two) times daily.  . Nerve Stimulator (TENS THERAPY PAIN RELIEF) DEVI 1 application by Does not apply route daily as needed.  . Omega-3 Fatty Acids (FISH OIL) 500 MG CAPS Take 500 mg by mouth 2 (two) times daily.  Marland Kitchen oxybutynin (DITROPAN-XL) 10 MG 24 hr tablet Take 10 mg by mouth at bedtime.  . PSYLLIUM PO Take by mouth daily.  . sennosides-docusate sodium (SENOKOT-S) 8.6-50 MG tablet Take 1 tablet by mouth as needed for constipation.  . TURMERIC PO Take 1 capsule by mouth daily.  . [DISCONTINUED] nitroGLYCERIN (NITROSTAT) 0.4 MG SL tablet Place 1 tablet (0.4 mg total) under Randall tongue every 5 (five) minutes as needed for chest pain. As directed  Allergies:   Oxybutynin   Past Medical History:  Diagnosis Date  . Anginal pain (HCC)    pressure- last time 11/23/12 saw Dr Harrington Challenger  . CAD, NATIVE VESSEL 07/22/2008    a.  s/p DES to LAD in 2009; s/p repeat DES to LAD 11/2008 (prox to previous stent).  . Constipation   . DIZZINESS, CHRONIC 02/27/2009  . DJD (degenerative joint disease)   . Edema 12/05/2008  . GERD (gastroesophageal reflux disease)   . History of kidney stones   . HYPERLIPIDEMIA-MIXED 07/22/2008  . HYPERTENSION, BENIGN 07/22/2008  . Lung nodule    RLL lung nodule (PET CT 1/10 negative);R parotid hypermetabolic on PET [id number]  . Shortness of breath    with exertion  . Stroke [facility]) [phone]   TIA  , Left hand stinging , hurts to flex  . Testicular cancer [facility])     Past Surgical History:  Procedure Laterality Date  . APPENDECTOMY    . Bilateral orchiectomy    . COLONOSCOPY  03/07/2020  . CORONARY ARTERY BYPASS GRAFT N/A 12/01/2012    Procedure: CORONARY ARTERY BYPASS GRAFTING (CABG);  Surgeon: Gaye Pollack, MD;  LIMA to OM and RIMA to RCA  . KIDNEY STONE SURGERY  10/19/2016  . ROTATOR CUFF REPAIR     right  . TONSILLECTOMY    . TOTAL HIP ARTHROPLASTY Left   . UPPER GASTROINTESTINAL ENDOSCOPY  03/07/2020  . Veins stripped       Social History:  [facility] patient  reports that he has never smoked. He has never used smokeless tobacco. He reports current alcohol use. He reports that he does not use drugs.   Family History:  Randall patient's family history includes Aneurysm in his brother; COPD in his father; Diabetes in his mother; Heart disease in his mother; Hypertension in his mother; Kidney disease in his brother.    ROS:  Please see Randall history of present illness. All other systems are reviewed and  Negative to Randall above problem except as noted.    PHYSICAL EXAM: VS:  BP (!) 110/58   Pulse 96   Ht 6' (1.829 m)   Wt 214 lb 3.2 oz (97.2 kg)   SpO2 99%   BMI 29.05 kg/m   GEN: Overweight 80 yo , in no acute distress  HEENT: normal  Neck: no JVD, carotid bruits Cardiac: RRR; no murmurs  No LE  edema  Respiratory:  clear to auscultation bilaterally, normal work of breathing GI: soft, nontender, nondistended, + BS  No hepatomegaly  MS: no deformity Moving all extremities   Skin: warm and dry, no rash Neuro:  Strength and sensation are intact Psych: euthymic mood, full affect   EKG:  EKG is not ordered today.   Lipid Panel    Component Value Date/Time   CHOL 131 05/11/2019 1437   TRIG 105 05/11/2019 1437   HDL 50 05/11/2019 1437   CHOLHDL 2.6 05/11/2019 1437   CHOLHDL 4 04/16/2014 1546   VLDL 37.2 04/16/2014 1546   LDLCALC 62 05/11/2019 1437      Wt Readings from Last 3 Encounters:  07/15/20 214 lb 3.2 oz (97.2 kg)  06/05/20 213 lb (96.6 kg)  04/22/20 213 lb 6 oz (96.8 kg)      ASSESSMENT AND PLAN: 1  CAD  [name] is now about 7 years post CABG.   I am not convinced of angina.  I do not think his  fatigue is an anginal equivalent   Follow   2  Hx HTN  BP is OK   Follow     3  HL  Keep on statin    4  Chronic fatigue / DOE     Again, I am not convinced an anginal equiv    He did undergo sleep eval about 7 years ago    ? If repeat eval would be worthwhile.  Will review.  F/U in 9 months  Current medicines are reviewed at length with Randall patient today.  Randall patient does not have concerns regarding medicines.  Signed, Dorris Carnes, MD  07/15/2020 10:25 PM    [address], [place], [place]  [address] Phone: [phone]; Fax: [phone]

## 2020-07-15 ENCOUNTER — Other Ambulatory Visit: Payer: Self-pay

## 2020-07-15 ENCOUNTER — Ambulatory Visit: Payer: PPO | Admitting: Internal Medicine

## 2020-07-15 ENCOUNTER — Encounter: Payer: Self-pay | Admitting: Internal Medicine

## 2020-07-15 VITALS — BP 110/58 | HR 96 | Ht 72.0 in | Wt 214.2 lb

## 2020-07-15 DIAGNOSIS — I2510 Atherosclerotic heart disease of native coronary artery without angina pectoris: Secondary | ICD-10-CM

## 2020-07-15 MED ORDER — NITROGLYCERIN 0.4 MG SL SUBL: 0.4000 mg | SUBLINGUAL_TABLET | SUBLINGUAL | 3 refills | Status: DC | PRN

## 2020-07-15 NOTE — Patient Instructions (Signed)
Medication Instructions:  No changes *If you need a refill on your cardiac medications before your next appointment, please call your pharmacy*   Lab Work: To be done at the [facility]   Testing/Procedures: none   Follow-Up: At [facility] you and your health needs are our priority.  As part of our continuing mission to provide you with exceptional heart care, we have created designated Provider Care Teams.  These Care Teams include your primary Cardiologist (physician) and Advanced Practice Providers (APPs -  Physician Assistants and Nurse Practitioners) who all work together to provide you with the care you need, when you need it.  We recommend signing up for the patient portal called "MyChart".  Sign up information is provided on this After Visit Summary.  MyChart is used to connect with patients for Virtual Visits (Telemedicine).  Patients are able to view lab/test results, encounter notes, upcoming appointments, etc.  Non-urgent messages can be sent to your provider as well.   To learn more about what you can do with MyChart, go to [email or web address].    Your next appointment:   9 month(s)  The format for your next appointment:   In Person  Provider:   You may see Dorris Carnes, MD or one of the following Advanced Practice Providers on your designated Care Team:    Richardson Dopp, PA-C  Robbie Lis, [place]    Other Instructions

## 2020-08-01 ENCOUNTER — Other Ambulatory Visit: Payer: Self-pay

## 2020-08-01 ENCOUNTER — Telehealth: Payer: Self-pay | Admitting: Gastroenterology

## 2020-08-01 DIAGNOSIS — R140 Abdominal distension (gaseous): Secondary | ICD-10-CM

## 2020-08-01 MED ORDER — LINACLOTIDE 145 MCG PO CAPS: 145.0000 ug | ORAL_CAPSULE | Freq: Every day | ORAL | 1 refills | Status: AC

## 2020-08-01 NOTE — Telephone Encounter (Signed)
From your December OV:  Continue Linzess 145 mcg but switch to PRN dosing schedule  Since he is to take this medication PRN and at the time of his visit we sent a 30 day supply with 2 refills, it seems he is not taking PRN but rather qd. In addition, I did not see when you wanted him to follow up in the office. Please advise about request for refill and when he should follow up with you in the office.

## 2020-08-01 NOTE — Telephone Encounter (Signed)
Please adjust the prescription to reflect daily dosing. I'd like to see him in the office within 6 months, earlier if needed. Thanks.

## 2020-08-01 NOTE — Telephone Encounter (Signed)
Inbound call from patient stating he needs refills for Linzess to be sent to [facility] on [address] if possible please.

## 2020-08-01 NOTE — Telephone Encounter (Signed)
Outpatient Medication Detail   Disp Refills Start End   linaclotide (LINZESS) 145 MCG CAPS capsule 90 capsule 1 08/01/2020    Sig - Route: Take 1 capsule (145 mcg total) by mouth daily before breakfast. FUTURE REFILL REQUESTS REQUIRE AN APPT - Oral   Sent to pharmacy as: linaclotide (LINZESS) 145 MCG Cap capsule   Notes to Pharmacy: FUTURE REFILL REQUESTS REQUIRE AN APPT   E-Prescribing Status: Receipt confirmed by pharmacy (08/01/2020  4:45 PM EST)    Pharmacy  [facility], [place] [address]  [address] [place] [address]  Phone:  [phone]  Fax:  [phone]

## 2021-01-13 DIAGNOSIS — N3941 Urge incontinence: Secondary | ICD-10-CM | POA: Diagnosis not present

## 2021-05-20 ENCOUNTER — Telehealth: Payer: Self-pay | Admitting: *Deleted

## 2021-05-20 NOTE — Telephone Encounter (Signed)
Clearance request received yesterday. Preop RMA, Anderson Malta yesterday left a message for [place] to please fax over a new clearance request with complete information needed. I myself have left messages x 2 today for new clearance request.     Pre-operative Risk Assessment    Patient Name: [name]  DOB: Jan 13, 1941 MRN: [id number]     Request for Surgical Clearance   Procedure:   LEFT MESSAGE X 3 FOR [facility] TO FAX NEW CLEARANCE REQUEST WITH COMPLETE INFORMATION NEEDED  Date of Surgery: Clearance TBD                                 Surgeon:  NEED NAME OF DENTIST PERFORMING PROCEDURE Surgeon's Group or Practice Name:  [facility] Phone number:  [phone] Fax number:  [phone]   Type of Clearance Requested: - Medical  - Pharmacy:  Hold Aspirin and Clopidogrel (Plavix)     Type of Anesthesia:   LEFT MESSAGE NEED TYPE OF ANESTHESIA IF ANY BEING USED    Additional requests/questions:   Jiles Prows   05/20/2021, 2:59 PM

## 2021-05-21 NOTE — Telephone Encounter (Signed)
I s/w [name] today with [employer] and confirmed clearance request information.   PROCEDURE: 2 TEETH BEING EXTRACTED ANESTHESIA: LOCAL DR. BLACK

## 2021-05-21 NOTE — Telephone Encounter (Signed)
   Patient Name: [name]  DOB: [birth year] MRN: [id number]  Primary Cardiologist: Dorris Carnes, MD  Chart reviewed as part of pre-operative protocol coverage. Simple dental extractions (1-2 teeth) are considered low risk procedures per guidelines and generally do not require any specific cardiac clearance. It is also generally accepted that for simple extractions and dental cleanings, there is no need to interrupt blood thinner therapy.  SBE prophylaxis is not required for the patient from a cardiac standpoint.  I will route this recommendation to the requesting party via Epic fax function and remove from pre-op pool.  Please call with questions.  Darreld Mclean, PA-C 05/21/2021, 1:58 PM

## 2021-08-06 ENCOUNTER — Telehealth: Payer: Self-pay | Admitting: Internal Medicine

## 2021-08-06 DIAGNOSIS — Z79899 Other long term (current) drug therapy: Secondary | ICD-10-CM

## 2021-08-06 DIAGNOSIS — I6521 Occlusion and stenosis of right carotid artery: Secondary | ICD-10-CM

## 2021-08-06 MED ORDER — NITROGLYCERIN 0.4 MG SL SUBL: 0.4000 mg | SUBLINGUAL_TABLET | SUBLINGUAL | 3 refills | Status: AC | PRN

## 2021-08-06 MED ORDER — ATORVASTATIN CALCIUM 40 MG PO TABS: 40.0000 mg | ORAL_TABLET | Freq: Every day | ORAL | 3 refills | Status: AC

## 2021-08-06 MED ORDER — CLOPIDOGREL BISULFATE 75 MG PO TABS: 75.0000 mg | ORAL_TABLET | Freq: Every day | ORAL | 3 refills | Status: AC

## 2021-08-06 NOTE — Telephone Encounter (Signed)
Spoke with the pt and sent in his refills..... I made him an appt with Dr. Harrington Challenger 08/19/21 just before he leaves for the Spring and Summer months.

## 2021-08-06 NOTE — Addendum Note (Signed)
Addended by: Stephani Police on: 08/06/2021 10:00 AM   Modules accepted: Orders

## 2021-08-06 NOTE — Telephone Encounter (Signed)
Pt called   He needs an appt   Needs refills

## 2021-08-17 NOTE — Progress Notes (Signed)
? ?Cardiology Office Note ? ? ?Date:  08/19/2021  ? ?ID:  [name], DOB 02/12/1941, MRN [id number] ? ?PCP:  [facility]  ?Cardiologist:   Dorris Carnes, MD  ? ?F/U of CAD  ?  ?History of Present Illness: CAD 9 ?[name] is a 81 y.o. male with a history of CAD s/p PTCA x 2 then  CABG in 2014 (LIMA to OM; RIMA to RCA)  Pt also with a history of kidney stones   I saw the pt in clinic in Nov 2020  He complained of fatgue   Myovue done which was negative for ischemia   He was last seen in cardiology by B Bhagat in Aug 2021   ? ?I saw the pt in Jan 2022 since seen he has stayed busy.  He continues to work.  He is currently working at the [employer] for [employer].  He notes rare chest pains, does get some shortness of breath with stairs.  Overall seems to be doing okay.  Stays active. ? ?The patient is being seen in the [place] [facility] in [place].  He is being evaluated for Parkinson's also being seen by urology. ? ?Current Meds  ?Medication Sig  ? Ascorbic Acid (VITAMIN C) 1000 MG tablet Take 1,000 mg by mouth at bedtime.  ? aspirin EC 81 MG tablet Take 81 mg by mouth daily.   ? Aspirin-Acetaminophen-Caffeine (GOODY HEADACHE PO) Take 1-2 packets by mouth 2 (two) times daily as needed (pain/headaches).   ? atorvastatin (LIPITOR) 40 MG tablet Take 1 tablet (40 mg total) by mouth daily.  ? b complex vitamins tablet Take 1 tablet by mouth 2 (two) times daily.  ? cholecalciferol (VITAMIN D) 1000 UNITS tablet Take 1,000 Units by mouth 2 (two) times daily.   ? clopidogrel (PLAVIX) 75 MG tablet Take 1 tablet (75 mg total) by mouth daily.  ? Coenzyme Q10 (CO Q 10) 100 MG CAPS Take 100 mg by mouth daily.   ? esomeprazole (NEXIUM) 40 MG capsule Take 1 capsule (40 mg total) by mouth 2 (two) times daily before a meal.  ? famotidine (PEPCID) 20 MG tablet Take 1 tablet (20 mg total) by mouth 2 (two) times daily.  ? Flaxseed, Linseed, (FLAX SEED OIL PO) Take 1 capsule by mouth daily.    ? folic acid (FOLVITE) 1 MG tablet Take 1 mg by mouth 2 (two) times daily.  ? gabapentin (NEURONTIN) 300 MG capsule Take 300 mg by mouth at bedtime.  ? Misc Natural Products (OSTEO BI-FLEX JOINT SHIELD PO) Take 1 tablet by mouth 2 (two) times daily.   ? Multiple Vitamins-Minerals (CENTRUM SILVER 50+MEN) TABS Take 1 tablet by mouth 2 (two) times daily.  ? Nerve Stimulator (TENS THERAPY PAIN RELIEF) DEVI 1 application by Does not apply route daily as needed.  ? nitroGLYCERIN (NITROSTAT) 0.4 MG SL tablet Place 1 tablet (0.4 mg total) under the tongue every 5 (five) minutes as needed for chest pain. As directed  ? Omega-3 Fatty Acids (FISH OIL) 500 MG CAPS Take 500 mg by mouth 2 (two) times daily.  ? PSYLLIUM PO Take by mouth daily.  ? sennosides-docusate sodium (SENOKOT-S) 8.6-50 MG tablet Take 1 tablet by mouth as needed for constipation.  ? TURMERIC PO Take 1 capsule by mouth daily.  ? ? ? ?Allergies:   Oxybutynin  ? ?Past Medical History:  ?Diagnosis Date  ? Anginal pain ([place])   ? pressure- last time 11/23/12 saw Dr Harrington Challenger  ?  CAD, NATIVE VESSEL 07/22/2008  ?  a.  s/p DES to LAD in 2009; s/p repeat DES to LAD 11/2008 (prox to previous stent).  ? Constipation   ? DIZZINESS, CHRONIC 02/27/2009  ? DJD (degenerative joint disease)   ? Edema 12/05/2008  ? GERD (gastroesophageal reflux disease)   ? History of kidney stones   ? HYPERLIPIDEMIA-MIXED 07/22/2008  ? HYPERTENSION, BENIGN 07/22/2008  ? Lung nodule   ? RLL lung nodule (PET CT 1/10 negative);R parotid hypermetabolic on PET 08/4194  ? Shortness of breath   ? with exertion  ? Stroke [facility]) [phone]  ? TIA  , Left hand stinging , hurts to flex  ? Testicular cancer ([place])   ? ? ?Past Surgical History:  ?Procedure Laterality Date  ? APPENDECTOMY    ? Bilateral orchiectomy    ? COLONOSCOPY  03/07/2020  ? CORONARY ARTERY BYPASS GRAFT N/A 12/01/2012  ? Procedure: CORONARY ARTERY BYPASS GRAFTING (CABG);  Surgeon: Gaye Pollack, MD;  LIMA to OM and RIMA to RCA  ? KIDNEY STONE SURGERY   10/19/2016  ? ROTATOR CUFF REPAIR    ? right  ? TONSILLECTOMY    ? TOTAL HIP ARTHROPLASTY Left   ? UPPER GASTROINTESTINAL ENDOSCOPY  03/07/2020  ? Veins stripped    ? ? ? ?Social History:  The patient  reports that he has never smoked. He has never used smokeless tobacco. He reports current alcohol use. He reports that he does not use drugs.  ? ?Family History:  The patient's family history includes Aneurysm in his brother; COPD in his father; Diabetes in his mother; Heart disease in his mother; Hypertension in his mother; Kidney disease in his brother.  ? ? ?ROS:  Please see the history of present illness. All other systems are reviewed and  Negative to the above problem except as noted.  ? ? ?PHYSICAL EXAM: ?VS:  BP 124/68   Pulse 64   Ht 6' (1.829 m)   Wt 220 lb 6.4 oz (100 kg)   SpO2 93%   BMI 29.89 kg/m?   ?GEN: Overweight 81 yo , in no acute distress  ?HEENT: normal  ?Neck: no JVD, carotid bruits ?Cardiac: RRR; no murmurs  No LE  edema  ?Respiratory:  clear to auscultation bilaterally ?GI: soft, nontender, nondistended, + BS  No hepatomegaly  ?MS: no deformity Moving all extremities   ?Skin: warm and dry, no rash ?Neuro:  Strength and sensation are intact ?Psych: euthymic mood, full affect ? ? ?EKG:  EKG is ordered today.  SR 64 bpm  First degree AV block PR interval 230 ms.  Anteroseptal MI. ? ?Lipid Panel ?   ?Component Value Date/Time  ? CHOL 131 05/11/2019 1437  ? TRIG 105 05/11/2019 1437  ? HDL 50 05/11/2019 1437  ? CHOLHDL 2.6 05/11/2019 1437  ? CHOLHDL 4 04/16/2014 1546  ? VLDL 37.2 04/16/2014 1546  ? [place] 62 05/11/2019 1437  ? ?  ? ?Wt Readings from Last 3 Encounters:  ?08/19/21 220 lb 6.4 oz (100 kg)  ?07/15/20 214 lb 3.2 oz (97.2 kg)  ?06/05/20 213 lb (96.6 kg)  ?  ? ? ?ASSESSMENT AND PLAN: ?1  CAD  Pt is now about 8  years post CABG. doing well from a cardiac standpoint.  I am not convinced of active symptoms.  He is quite active. ? ?2  Hx HTN  BP is controlled.  Keep on current  regimen. ? ?3  HL  Keep on statin.  Will check  lipid panel today. ? ?4  Chronic fatigue / DOE     this has not been his biggest complaint today.  Follow-up. ? ?Follow-up in 1 year. ? ?Current medicines are reviewed at length with the patient today.  The patient does not have concerns regarding medicines. ? ?Signed, ?Dorris Carnes, MD  ?08/19/2021 9:13 AM    ?[facility] ?[address], [place], [place]  [address] ?Phone: [phone]; Fax: [phone]  ? ? ?

## 2021-08-19 ENCOUNTER — Other Ambulatory Visit: Payer: Self-pay

## 2021-08-19 ENCOUNTER — Encounter: Payer: Self-pay | Admitting: Internal Medicine

## 2021-08-19 ENCOUNTER — Ambulatory Visit: Payer: Medicare HMO | Admitting: Internal Medicine

## 2021-08-19 VITALS — BP 124/68 | HR 64 | Ht 72.0 in | Wt 220.4 lb

## 2021-08-19 DIAGNOSIS — E782 Mixed hyperlipidemia: Secondary | ICD-10-CM | POA: Diagnosis not present

## 2021-08-19 DIAGNOSIS — I2510 Atherosclerotic heart disease of native coronary artery without angina pectoris: Secondary | ICD-10-CM | POA: Diagnosis not present

## 2021-08-19 DIAGNOSIS — I6521 Occlusion and stenosis of right carotid artery: Secondary | ICD-10-CM | POA: Diagnosis not present

## 2021-08-19 DIAGNOSIS — Z79899 Other long term (current) drug therapy: Secondary | ICD-10-CM | POA: Diagnosis not present

## 2021-08-19 LAB — BASIC METABOLIC PANEL
BUN/Creatinine Ratio: 26 — ABNORMAL HIGH (ref 10–24)
BUN: 25 mg/dL (ref 8–27)
CO2: 26 mmol/L (ref 20–29)
Calcium: 9.7 mg/dL (ref 8.6–10.2)
Chloride: 104 mmol/L (ref 96–106)
Creatinine, Ser: 0.97 mg/dL (ref 0.76–1.27)
Glucose: 99 mg/dL (ref 70–99)
Potassium: 4.9 mmol/L (ref 3.5–5.2)
Sodium: 148 mmol/L — ABNORMAL HIGH (ref 134–144)
eGFR: 78 mL/min/{1.73_m2} (ref >59)

## 2021-08-19 LAB — LIPID PANEL
Chol/HDL Ratio: 2.8 ratio (ref 0.0–5.0)
Cholesterol, Total: 159 mg/dL (ref 100–199)
HDL: 57 mg/dL (ref >39)
LDL Chol Calc (NIH): 84 mg/dL (ref 0–99)
Triglycerides: 99 mg/dL (ref 0–149)
VLDL Cholesterol Cal: 18 mg/dL (ref 5–40)

## 2021-08-19 LAB — CBC
Hematocrit: 38.9 % (ref 37.5–51.0)
Hemoglobin: 12.9 g/dL — ABNORMAL LOW (ref 13.0–17.7)
MCH: 31.5 pg (ref 26.6–33.0)
MCHC: 33.2 g/dL (ref 31.5–35.7)
MCV: 95 fL (ref 79–97)
Platelets: 238 10*3/uL (ref 150–450)
RBC: 4.1 x10E6/uL — ABNORMAL LOW (ref 4.14–5.80)
RDW: 12.7 % (ref 11.6–15.4)
WBC: 6.8 10*3/uL (ref 3.4–10.8)

## 2021-08-19 NOTE — Patient Instructions (Signed)
Medication Instructions:  ?Your physician recommends that you continue on your current medications as directed. Please refer to the Current Medication list given to you today. ? ?*If you need a refill on your cardiac medications before your next appointment, please call your pharmacy* ? ? ?Lab Work: ?Bmet, cbc, lipid ?If you have labs (blood work) drawn today and your tests are completely normal, you will receive your results only by: ?MyChart Message (if you have MyChart) OR ?A paper copy in the mail ?If you have any lab test that is abnormal or we need to change your treatment, we will call you to review the results. ? ? ?Testing/Procedures: ?none ? ? ?Follow-Up: ?At [facility], you and your health needs are our priority.  As part of our continuing mission to provide you with exceptional heart care, we have created designated Provider Care Teams.  These Care Teams include your primary Cardiologist (physician) and Advanced Practice Providers (APPs -  Physician Assistants and Nurse Practitioners) who all work together to provide you with the care you need, when you need it. ? ?We recommend signing up for the patient portal called "MyChart".  Sign up information is provided on this After Visit Summary.  MyChart is used to connect with patients for Virtual Visits (Telemedicine).  Patients are able to view lab/test results, encounter notes, upcoming appointments, etc.  Non-urgent messages can be sent to your provider as well.   ?To learn more about what you can do with MyChart, go to [email or web address].   ? ?Your next appointment:   ?1 year(s) ? ?The format for your next appointment:   ?In Person ? ?Provider:   ?Dorris Carnes, MD   ? ? ?Other Instructions ?  ?

## 2021-08-24 ENCOUNTER — Telehealth: Payer: Self-pay

## 2021-08-24 DIAGNOSIS — Z79899 Other long term (current) drug therapy: Secondary | ICD-10-CM

## 2021-08-24 DIAGNOSIS — E782 Mixed hyperlipidemia: Secondary | ICD-10-CM

## 2021-08-24 DIAGNOSIS — I10 Essential (primary) hypertension: Secondary | ICD-10-CM

## 2021-08-24 DIAGNOSIS — I2510 Atherosclerotic heart disease of native coronary artery without angina pectoris: Secondary | ICD-10-CM

## 2021-08-24 MED ORDER — EZETIMIBE 10 MG PO TABS: 10.0000 mg | ORAL_TABLET | Freq: Every day | ORAL | 3 refills | Status: AC

## 2021-08-24 NOTE — Telephone Encounter (Signed)
Pt verbalized understanding of his lab results and will return for repeat fasting labs 10/27/21.  ?

## 2021-08-24 NOTE — Telephone Encounter (Signed)
-----   Message from Fay Records, MD sent at 08/21/2021 12:21 AM EST ----- ?CBC is OK ?Kidney function is normal ?Sodium is a little high    Stay hydrated      ?Lipids were better in the past    I would add Zetia 10 mg to regimen     ?F/U lipids in 8 wks     Check BMET when that is done   ?

## 2021-08-25 DIAGNOSIS — M47816 Spondylosis without myelopathy or radiculopathy, lumbar region: Secondary | ICD-10-CM | POA: Diagnosis not present

## 2021-08-25 DIAGNOSIS — M4722 Other spondylosis with radiculopathy, cervical region: Secondary | ICD-10-CM | POA: Diagnosis not present

## 2021-08-25 DIAGNOSIS — M9903 Segmental and somatic dysfunction of lumbar region: Secondary | ICD-10-CM | POA: Diagnosis not present

## 2021-08-25 DIAGNOSIS — M9901 Segmental and somatic dysfunction of cervical region: Secondary | ICD-10-CM | POA: Diagnosis not present

## 2021-08-25 DIAGNOSIS — M47894 Other spondylosis, thoracic region: Secondary | ICD-10-CM | POA: Diagnosis not present

## 2021-08-25 DIAGNOSIS — M9902 Segmental and somatic dysfunction of thoracic region: Secondary | ICD-10-CM | POA: Diagnosis not present

## 2021-08-31 DIAGNOSIS — M4722 Other spondylosis with radiculopathy, cervical region: Secondary | ICD-10-CM | POA: Diagnosis not present

## 2021-08-31 DIAGNOSIS — M9901 Segmental and somatic dysfunction of cervical region: Secondary | ICD-10-CM | POA: Diagnosis not present

## 2021-08-31 DIAGNOSIS — M47894 Other spondylosis, thoracic region: Secondary | ICD-10-CM | POA: Diagnosis not present

## 2021-08-31 DIAGNOSIS — M9903 Segmental and somatic dysfunction of lumbar region: Secondary | ICD-10-CM | POA: Diagnosis not present

## 2021-08-31 DIAGNOSIS — M47816 Spondylosis without myelopathy or radiculopathy, lumbar region: Secondary | ICD-10-CM | POA: Diagnosis not present

## 2021-08-31 DIAGNOSIS — M9902 Segmental and somatic dysfunction of thoracic region: Secondary | ICD-10-CM | POA: Diagnosis not present

## 2021-09-01 DIAGNOSIS — M47894 Other spondylosis, thoracic region: Secondary | ICD-10-CM | POA: Diagnosis not present

## 2021-09-01 DIAGNOSIS — M9903 Segmental and somatic dysfunction of lumbar region: Secondary | ICD-10-CM | POA: Diagnosis not present

## 2021-09-01 DIAGNOSIS — M4722 Other spondylosis with radiculopathy, cervical region: Secondary | ICD-10-CM | POA: Diagnosis not present

## 2021-09-01 DIAGNOSIS — M9901 Segmental and somatic dysfunction of cervical region: Secondary | ICD-10-CM | POA: Diagnosis not present

## 2021-09-01 DIAGNOSIS — M9902 Segmental and somatic dysfunction of thoracic region: Secondary | ICD-10-CM | POA: Diagnosis not present

## 2021-09-01 DIAGNOSIS — M47816 Spondylosis without myelopathy or radiculopathy, lumbar region: Secondary | ICD-10-CM | POA: Diagnosis not present

## 2021-09-09 DIAGNOSIS — M47894 Other spondylosis, thoracic region: Secondary | ICD-10-CM | POA: Diagnosis not present

## 2021-09-09 DIAGNOSIS — M9902 Segmental and somatic dysfunction of thoracic region: Secondary | ICD-10-CM | POA: Diagnosis not present

## 2021-09-09 DIAGNOSIS — M9903 Segmental and somatic dysfunction of lumbar region: Secondary | ICD-10-CM | POA: Diagnosis not present

## 2021-09-09 DIAGNOSIS — M9901 Segmental and somatic dysfunction of cervical region: Secondary | ICD-10-CM | POA: Diagnosis not present

## 2021-09-09 DIAGNOSIS — M47816 Spondylosis without myelopathy or radiculopathy, lumbar region: Secondary | ICD-10-CM | POA: Diagnosis not present

## 2021-09-09 DIAGNOSIS — M4722 Other spondylosis with radiculopathy, cervical region: Secondary | ICD-10-CM | POA: Diagnosis not present

## 2021-09-10 DIAGNOSIS — M47816 Spondylosis without myelopathy or radiculopathy, lumbar region: Secondary | ICD-10-CM | POA: Diagnosis not present

## 2021-09-10 DIAGNOSIS — M47894 Other spondylosis, thoracic region: Secondary | ICD-10-CM | POA: Diagnosis not present

## 2021-09-10 DIAGNOSIS — M4722 Other spondylosis with radiculopathy, cervical region: Secondary | ICD-10-CM | POA: Diagnosis not present

## 2021-09-10 DIAGNOSIS — M9901 Segmental and somatic dysfunction of cervical region: Secondary | ICD-10-CM | POA: Diagnosis not present

## 2021-09-10 DIAGNOSIS — M9902 Segmental and somatic dysfunction of thoracic region: Secondary | ICD-10-CM | POA: Diagnosis not present

## 2021-09-10 DIAGNOSIS — M9903 Segmental and somatic dysfunction of lumbar region: Secondary | ICD-10-CM | POA: Diagnosis not present

## 2021-09-14 DIAGNOSIS — M9902 Segmental and somatic dysfunction of thoracic region: Secondary | ICD-10-CM | POA: Diagnosis not present

## 2021-09-14 DIAGNOSIS — M9903 Segmental and somatic dysfunction of lumbar region: Secondary | ICD-10-CM | POA: Diagnosis not present

## 2021-09-14 DIAGNOSIS — M4722 Other spondylosis with radiculopathy, cervical region: Secondary | ICD-10-CM | POA: Diagnosis not present

## 2021-09-14 DIAGNOSIS — M9901 Segmental and somatic dysfunction of cervical region: Secondary | ICD-10-CM | POA: Diagnosis not present

## 2021-09-14 DIAGNOSIS — M47894 Other spondylosis, thoracic region: Secondary | ICD-10-CM | POA: Diagnosis not present

## 2021-09-14 DIAGNOSIS — M47816 Spondylosis without myelopathy or radiculopathy, lumbar region: Secondary | ICD-10-CM | POA: Diagnosis not present

## 2021-10-27 ENCOUNTER — Other Ambulatory Visit: Payer: Medicare HMO

## 2021-10-29 ENCOUNTER — Other Ambulatory Visit: Payer: Medicare HMO

## 2021-10-29 DIAGNOSIS — I10 Essential (primary) hypertension: Secondary | ICD-10-CM

## 2021-10-29 DIAGNOSIS — Z79899 Other long term (current) drug therapy: Secondary | ICD-10-CM | POA: Diagnosis not present

## 2021-10-29 DIAGNOSIS — I2510 Atherosclerotic heart disease of native coronary artery without angina pectoris: Secondary | ICD-10-CM | POA: Diagnosis not present

## 2021-10-29 DIAGNOSIS — E782 Mixed hyperlipidemia: Secondary | ICD-10-CM | POA: Diagnosis not present

## 2021-10-30 LAB — BASIC METABOLIC PANEL
BUN/Creatinine Ratio: 29 — ABNORMAL HIGH (ref 10–24)
BUN: 22 mg/dL (ref 8–27)
CO2: 24 mmol/L (ref 20–29)
Calcium: 9.5 mg/dL (ref 8.6–10.2)
Chloride: 105 mmol/L (ref 96–106)
Creatinine, Ser: 0.77 mg/dL (ref 0.76–1.27)
Glucose: 107 mg/dL — ABNORMAL HIGH (ref 70–99)
Potassium: 4.8 mmol/L (ref 3.5–5.2)
Sodium: 141 mmol/L (ref 134–144)
eGFR: 90 mL/min/{1.73_m2} (ref >59)

## 2021-10-30 LAB — LIPID PANEL
Chol/HDL Ratio: 2.6 ratio (ref 0.0–5.0)
Cholesterol, Total: 118 mg/dL (ref 100–199)
HDL: 45 mg/dL (ref >39)
LDL Chol Calc (NIH): 51 mg/dL (ref 0–99)
Triglycerides: 126 mg/dL (ref 0–149)
VLDL Cholesterol Cal: 22 mg/dL (ref 5–40)

## 2021-11-06 DIAGNOSIS — M9902 Segmental and somatic dysfunction of thoracic region: Secondary | ICD-10-CM | POA: Diagnosis not present

## 2021-11-06 DIAGNOSIS — M4722 Other spondylosis with radiculopathy, cervical region: Secondary | ICD-10-CM | POA: Diagnosis not present

## 2021-11-06 DIAGNOSIS — M9901 Segmental and somatic dysfunction of cervical region: Secondary | ICD-10-CM | POA: Diagnosis not present

## 2021-11-06 DIAGNOSIS — M47816 Spondylosis without myelopathy or radiculopathy, lumbar region: Secondary | ICD-10-CM | POA: Diagnosis not present

## 2021-11-06 DIAGNOSIS — M9903 Segmental and somatic dysfunction of lumbar region: Secondary | ICD-10-CM | POA: Diagnosis not present

## 2021-11-06 DIAGNOSIS — M47894 Other spondylosis, thoracic region: Secondary | ICD-10-CM | POA: Diagnosis not present

## 2022-01-14 DIAGNOSIS — I440 Atrioventricular block, first degree: Secondary | ICD-10-CM | POA: Diagnosis not present

## 2022-01-14 DIAGNOSIS — K219 Gastro-esophageal reflux disease without esophagitis: Secondary | ICD-10-CM | POA: Diagnosis not present

## 2022-01-14 DIAGNOSIS — G629 Polyneuropathy, unspecified: Secondary | ICD-10-CM | POA: Diagnosis not present

## 2022-01-14 DIAGNOSIS — Z7982 Long term (current) use of aspirin: Secondary | ICD-10-CM | POA: Diagnosis not present

## 2022-01-14 DIAGNOSIS — Z951 Presence of aortocoronary bypass graft: Secondary | ICD-10-CM | POA: Diagnosis not present

## 2022-01-14 DIAGNOSIS — D649 Anemia, unspecified: Secondary | ICD-10-CM | POA: Diagnosis not present

## 2022-01-14 DIAGNOSIS — I2510 Atherosclerotic heart disease of native coronary artery without angina pectoris: Secondary | ICD-10-CM | POA: Diagnosis not present

## 2022-01-14 DIAGNOSIS — G20 Parkinson's disease: Secondary | ICD-10-CM | POA: Diagnosis not present

## 2022-01-14 DIAGNOSIS — Z8673 Personal history of transient ischemic attack (TIA), and cerebral infarction without residual deficits: Secondary | ICD-10-CM | POA: Diagnosis not present

## 2022-01-14 DIAGNOSIS — E785 Hyperlipidemia, unspecified: Secondary | ICD-10-CM | POA: Diagnosis not present

## 2022-01-14 DIAGNOSIS — Z7902 Long term (current) use of antithrombotics/antiplatelets: Secondary | ICD-10-CM | POA: Diagnosis not present

## 2022-01-14 DIAGNOSIS — N401 Enlarged prostate with lower urinary tract symptoms: Secondary | ICD-10-CM | POA: Diagnosis not present

## 2022-01-14 DIAGNOSIS — N200 Calculus of kidney: Secondary | ICD-10-CM | POA: Diagnosis not present

## 2022-01-14 DIAGNOSIS — Z79899 Other long term (current) drug therapy: Secondary | ICD-10-CM | POA: Diagnosis not present

## 2022-01-14 DIAGNOSIS — N3281 Overactive bladder: Secondary | ICD-10-CM | POA: Diagnosis not present

## 2022-01-14 DIAGNOSIS — N132 Hydronephrosis with renal and ureteral calculous obstruction: Secondary | ICD-10-CM | POA: Diagnosis not present

## 2022-01-15 DIAGNOSIS — D649 Anemia, unspecified: Secondary | ICD-10-CM | POA: Diagnosis not present

## 2022-01-15 DIAGNOSIS — K219 Gastro-esophageal reflux disease without esophagitis: Secondary | ICD-10-CM | POA: Diagnosis not present

## 2022-01-15 DIAGNOSIS — N401 Enlarged prostate with lower urinary tract symptoms: Secondary | ICD-10-CM | POA: Diagnosis not present

## 2022-01-15 DIAGNOSIS — Z951 Presence of aortocoronary bypass graft: Secondary | ICD-10-CM | POA: Diagnosis not present

## 2022-01-15 DIAGNOSIS — I2510 Atherosclerotic heart disease of native coronary artery without angina pectoris: Secondary | ICD-10-CM | POA: Diagnosis not present

## 2022-01-15 DIAGNOSIS — E785 Hyperlipidemia, unspecified: Secondary | ICD-10-CM | POA: Diagnosis not present

## 2022-01-15 DIAGNOSIS — Z7902 Long term (current) use of antithrombotics/antiplatelets: Secondary | ICD-10-CM | POA: Diagnosis not present

## 2022-01-15 DIAGNOSIS — N3281 Overactive bladder: Secondary | ICD-10-CM | POA: Diagnosis not present

## 2022-01-15 DIAGNOSIS — I440 Atrioventricular block, first degree: Secondary | ICD-10-CM | POA: Diagnosis not present

## 2022-01-15 DIAGNOSIS — Z79899 Other long term (current) drug therapy: Secondary | ICD-10-CM | POA: Diagnosis not present

## 2022-01-15 DIAGNOSIS — G629 Polyneuropathy, unspecified: Secondary | ICD-10-CM | POA: Diagnosis not present

## 2022-01-15 DIAGNOSIS — Z7982 Long term (current) use of aspirin: Secondary | ICD-10-CM | POA: Diagnosis not present

## 2022-01-15 DIAGNOSIS — G20 Parkinson's disease: Secondary | ICD-10-CM | POA: Diagnosis not present

## 2022-01-15 DIAGNOSIS — N200 Calculus of kidney: Secondary | ICD-10-CM | POA: Diagnosis not present

## 2022-01-15 DIAGNOSIS — Z8673 Personal history of transient ischemic attack (TIA), and cerebral infarction without residual deficits: Secondary | ICD-10-CM | POA: Diagnosis not present

## 2022-01-26 DIAGNOSIS — I469 Cardiac arrest, cause unspecified: Secondary | ICD-10-CM | POA: Diagnosis not present

## 2022-01-26 DIAGNOSIS — R0689 Other abnormalities of breathing: Secondary | ICD-10-CM | POA: Diagnosis not present

## 2022-01-26 DIAGNOSIS — I499 Cardiac arrhythmia, unspecified: Secondary | ICD-10-CM | POA: Diagnosis not present

## 2022-01-26 DIAGNOSIS — R404 Transient alteration of awareness: Secondary | ICD-10-CM | POA: Diagnosis not present

## 2022-02-03 ENCOUNTER — Telehealth: Payer: Self-pay | Admitting: Internal Medicine

## 2022-02-03 NOTE — Telephone Encounter (Signed)
Juliann Pulse from [facility] requesting a death certificate from Dr. Harrington Challenger be signed so they can proceed with cremation. She states pt died on 2022-02-05, an email has also been sent to [place].

## 2022-02-03 NOTE — Telephone Encounter (Signed)
I can come by and sign death certificate later today   Call/text [facility] where it is

## 2022-02-03 NOTE — Telephone Encounter (Signed)
Juliann Pulse from [facility] is calling to request for Dr. Harrington Challenger to sign the pts death certificate.  Pt passed away in the home per [place] on 8/8.  Juliann Pulse aware that Dr. Harrington Challenger is out of the office this week.  Juliann Pulse aware that I will certainly route this message to Dr. Harrington Challenger to further review and advise on when she can, but I cannot guarantee a timeline for this, for she is off this week per qgenda.  Juliann Pulse aware we will let her know promptly if Dr. Harrington Challenger advises on this.  Ulla Potash if she is still having difficulties getting this signed towards the end of this business week, she could always reach out to the pts PCP with [facility] to assist with this, so there is no further delay in cremation.  Juliann Pulse has the number for them.  Juliann Pulse verbalized understanding and agrees with this plan.

## 2022-02-03 NOTE — Telephone Encounter (Signed)
Dr. Harrington Challenger you have to go into [employer] in [place] and go down to [place] to fill out and complete death certificate.  No more hard copies. Hope this is helpful.  Thanks!

## 2022-02-19 DEATH — deceased
